# Patient Record
Sex: Female | Born: 1940 | Race: Black or African American | Hispanic: No | Marital: Married | Smoking: Former smoker
Health system: Southern US, Community
[De-identification: ages and names within clinical notes are randomized; demographics above are authoritative.]

## PROBLEM LIST (undated history)

## (undated) DIAGNOSIS — Z5189 Encounter for other specified aftercare: Secondary | ICD-10-CM

## (undated) DIAGNOSIS — D508 Other iron deficiency anemias: Secondary | ICD-10-CM

## (undated) DIAGNOSIS — K760 Fatty (change of) liver, not elsewhere classified: Secondary | ICD-10-CM

## (undated) DIAGNOSIS — I10 Essential (primary) hypertension: Secondary | ICD-10-CM

## (undated) DIAGNOSIS — D5720 Sickle-cell/Hb-C disease without crisis: Secondary | ICD-10-CM

## (undated) DIAGNOSIS — D631 Anemia in chronic kidney disease: Secondary | ICD-10-CM

## (undated) DIAGNOSIS — N189 Chronic kidney disease, unspecified: Principal | ICD-10-CM

## (undated) DIAGNOSIS — D571 Sickle-cell disease without crisis: Secondary | ICD-10-CM

## (undated) HISTORY — DX: Fatty (change of) liver, not elsewhere classified: K76.0

## (undated) HISTORY — DX: Anemia in chronic kidney disease: D63.1

## (undated) HISTORY — PX: SHOULDER SURGERY: SHX246

## (undated) HISTORY — PX: KNEE SURGERY: SHX244

## (undated) HISTORY — PX: CHOLECYSTECTOMY: SHX55

## (undated) HISTORY — PX: APPENDECTOMY: SHX54

## (undated) HISTORY — DX: Sickle-cell/Hb-C disease without crisis: D57.20

## (undated) HISTORY — DX: Chronic kidney disease, unspecified: N18.9

## (undated) HISTORY — DX: Other iron deficiency anemias: D50.8

---

## 1998-01-21 ENCOUNTER — Other Ambulatory Visit: Admission: RE | Admit: 1998-01-21 | Discharge: 1998-01-21 | Payer: Self-pay | Admitting: Obstetrics & Gynecology

## 1999-07-07 ENCOUNTER — Encounter: Admission: RE | Admit: 1999-07-07 | Discharge: 1999-10-05 | Payer: Self-pay | Admitting: Internal Medicine

## 1999-09-27 ENCOUNTER — Encounter: Admission: RE | Admit: 1999-09-27 | Discharge: 1999-09-27 | Payer: Self-pay | Admitting: Internal Medicine

## 1999-09-27 ENCOUNTER — Encounter: Payer: Self-pay | Admitting: Internal Medicine

## 1999-10-12 ENCOUNTER — Encounter (INDEPENDENT_AMBULATORY_CARE_PROVIDER_SITE_OTHER): Payer: Self-pay

## 1999-10-12 ENCOUNTER — Other Ambulatory Visit: Admission: RE | Admit: 1999-10-12 | Discharge: 1999-10-12 | Payer: Self-pay | Admitting: Obstetrics & Gynecology

## 2000-11-27 ENCOUNTER — Inpatient Hospital Stay (HOSPITAL_COMMUNITY): Admission: AD | Admit: 2000-11-27 | Discharge: 2000-11-29 | Payer: Self-pay | Admitting: Internal Medicine

## 2001-01-02 ENCOUNTER — Observation Stay (HOSPITAL_COMMUNITY): Admission: EM | Admit: 2001-01-02 | Discharge: 2001-01-06 | Payer: Self-pay | Admitting: Emergency Medicine

## 2001-01-02 ENCOUNTER — Encounter: Payer: Self-pay | Admitting: Emergency Medicine

## 2001-01-03 ENCOUNTER — Encounter: Payer: Self-pay | Admitting: Internal Medicine

## 2001-01-07 ENCOUNTER — Encounter: Admission: RE | Admit: 2001-01-07 | Discharge: 2001-04-07 | Payer: Self-pay | Admitting: Internal Medicine

## 2001-09-23 ENCOUNTER — Other Ambulatory Visit: Admission: RE | Admit: 2001-09-23 | Discharge: 2001-09-23 | Payer: Self-pay | Admitting: Obstetrics & Gynecology

## 2002-03-28 ENCOUNTER — Other Ambulatory Visit: Admission: RE | Admit: 2002-03-28 | Discharge: 2002-03-28 | Payer: Self-pay | Admitting: Obstetrics & Gynecology

## 2003-04-30 ENCOUNTER — Other Ambulatory Visit: Admission: RE | Admit: 2003-04-30 | Discharge: 2003-04-30 | Payer: Self-pay | Admitting: Obstetrics & Gynecology

## 2004-05-09 ENCOUNTER — Other Ambulatory Visit: Admission: RE | Admit: 2004-05-09 | Discharge: 2004-05-09 | Payer: Self-pay | Admitting: Obstetrics & Gynecology

## 2005-06-14 ENCOUNTER — Other Ambulatory Visit: Admission: RE | Admit: 2005-06-14 | Discharge: 2005-06-14 | Payer: Self-pay | Admitting: Obstetrics & Gynecology

## 2005-08-15 ENCOUNTER — Ambulatory Visit: Payer: Self-pay | Admitting: Hematology and Oncology

## 2005-08-18 ENCOUNTER — Encounter (HOSPITAL_COMMUNITY): Admission: RE | Admit: 2005-08-18 | Discharge: 2005-09-15 | Payer: Self-pay | Admitting: Hematology and Oncology

## 2005-11-15 ENCOUNTER — Ambulatory Visit: Payer: Self-pay | Admitting: Hematology and Oncology

## 2006-03-06 ENCOUNTER — Ambulatory Visit: Payer: Self-pay | Admitting: Hematology and Oncology

## 2006-03-08 LAB — CBC WITH DIFFERENTIAL/PLATELET
BASO%: 0.9 % (ref 0.0–2.0)
EOS%: 2 % (ref 0.0–7.0)
HCT: 27.4 % — ABNORMAL LOW (ref 34.8–46.6)
LYMPH%: 24.8 % (ref 14.0–48.0)
MCH: 24.1 pg — ABNORMAL LOW (ref 26.0–34.0)
MCHC: 33.6 g/dL (ref 32.0–36.0)
MCV: 71.7 fL — ABNORMAL LOW (ref 81.0–101.0)
MONO%: 8 % (ref 0.0–13.0)
NEUT%: 64.3 % (ref 39.6–76.8)
Platelets: 140 10*3/uL — ABNORMAL LOW (ref 145–400)
RBC: 3.82 10*6/uL (ref 3.70–5.32)

## 2006-03-08 LAB — COMPREHENSIVE METABOLIC PANEL
ALT: 11 U/L (ref 0–40)
AST: 16 U/L (ref 0–37)
Alkaline Phosphatase: 75 U/L (ref 39–117)
CO2: 19 mEq/L (ref 19–32)
Creatinine, Ser: 1.69 mg/dL — ABNORMAL HIGH (ref 0.40–1.20)
Total Bilirubin: 0.8 mg/dL (ref 0.3–1.2)

## 2006-03-08 LAB — VITAMIN B12: Vitamin B-12: 617 pg/mL (ref 211–911)

## 2006-09-04 ENCOUNTER — Ambulatory Visit: Payer: Self-pay | Admitting: Hematology and Oncology

## 2008-02-19 ENCOUNTER — Encounter: Admission: RE | Admit: 2008-02-19 | Discharge: 2008-05-21 | Payer: Self-pay | Admitting: Orthopaedic Surgery

## 2008-06-01 ENCOUNTER — Encounter: Admission: RE | Admit: 2008-06-01 | Discharge: 2008-06-01 | Payer: Self-pay | Admitting: Orthopaedic Surgery

## 2009-11-30 ENCOUNTER — Ambulatory Visit (HOSPITAL_BASED_OUTPATIENT_CLINIC_OR_DEPARTMENT_OTHER)
Admission: RE | Admit: 2009-11-30 | Discharge: 2009-11-30 | Payer: Self-pay | Source: Home / Self Care | Admitting: Internal Medicine

## 2009-11-30 ENCOUNTER — Ambulatory Visit: Payer: Self-pay | Admitting: Diagnostic Radiology

## 2010-01-27 ENCOUNTER — Ambulatory Visit: Payer: Self-pay | Admitting: Hematology & Oncology

## 2010-01-28 LAB — CBC WITH DIFFERENTIAL (CANCER CENTER ONLY)
BASO#: 0 10*3/uL (ref 0.0–0.2)
EOS%: 4.5 % (ref 0.0–7.0)
Eosinophils Absolute: 0.3 10*3/uL (ref 0.0–0.5)
HGB: 9.2 g/dL — ABNORMAL LOW (ref 11.6–15.9)
LYMPH#: 2.2 10*3/uL (ref 0.9–3.3)
MCHC: 32 g/dL (ref 32.0–36.0)
MONO#: 0.5 10*3/uL (ref 0.1–0.9)
NEUT#: 3.9 10*3/uL (ref 1.5–6.5)
RBC: 3.82 10*6/uL (ref 3.70–5.32)
WBC: 6.9 10*3/uL (ref 3.9–10.0)

## 2010-01-28 LAB — TECHNOLOGIST REVIEW CHCC SATELLITE

## 2010-02-04 ENCOUNTER — Ambulatory Visit (HOSPITAL_BASED_OUTPATIENT_CLINIC_OR_DEPARTMENT_OTHER): Admission: RE | Admit: 2010-02-04 | Discharge: 2010-02-04 | Payer: Self-pay | Admitting: Hematology & Oncology

## 2010-02-04 ENCOUNTER — Ambulatory Visit: Payer: Self-pay | Admitting: Diagnostic Radiology

## 2010-02-07 LAB — HGB ELECTROPHORESIS REFLEXED REPORT
Hemoglobin A - HGBRFX: 0 % — ABNORMAL LOW (ref >96.0)
Hemoglobin Elect C: 44.2 % — ABNORMAL HIGH
Sickle Solubility Test - HGBRFX: POSITIVE — AB

## 2010-02-07 LAB — LACTATE DEHYDROGENASE: LDH: 205 U/L (ref 94–250)

## 2010-02-07 LAB — COMPREHENSIVE METABOLIC PANEL
AST: 10 U/L (ref 0–37)
Albumin: 4.6 g/dL (ref 3.5–5.2)
Alkaline Phosphatase: 97 U/L (ref 39–117)
Glucose, Bld: 74 mg/dL (ref 70–99)
Potassium: 4.5 mEq/L (ref 3.5–5.3)
Sodium: 142 mEq/L (ref 135–145)
Total Protein: 6.9 g/dL (ref 6.0–8.3)

## 2010-02-07 LAB — HEMOGLOBINOPATHY EVALUATION: Hgb S Quant: 49.8 % — ABNORMAL HIGH (ref 0.0–0.0)

## 2010-02-07 LAB — RETICULOCYTES (CHCC)
RBC.: 3.83 MIL/uL — ABNORMAL LOW (ref 3.87–5.11)
Retic Ct Pct: 5 % — ABNORMAL HIGH (ref 0.4–3.1)

## 2010-02-24 LAB — FERRITIN: Ferritin: 473 ng/mL — ABNORMAL HIGH (ref 10–291)

## 2010-02-24 LAB — CBC WITH DIFFERENTIAL (CANCER CENTER ONLY)
BASO#: 0.1 10*3/uL (ref 0.0–0.2)
EOS%: 4.2 % (ref 0.0–7.0)
Eosinophils Absolute: 0.3 10*3/uL (ref 0.0–0.5)
HGB: 9.4 g/dL — ABNORMAL LOW (ref 11.6–15.9)
LYMPH#: 2.2 10*3/uL (ref 0.9–3.3)
MONO#: 0.4 10*3/uL (ref 0.1–0.9)
NEUT#: 3.6 10*3/uL (ref 1.5–6.5)
RBC: 3.92 10*6/uL (ref 3.70–5.32)
WBC: 6.6 10*3/uL (ref 3.9–10.0)

## 2010-02-24 LAB — RETICULOCYTES (CHCC)
RBC.: 3.87 MIL/uL (ref 3.87–5.11)
Retic Ct Pct: 3.9 % — ABNORMAL HIGH (ref 0.4–3.1)

## 2010-02-24 LAB — TECHNOLOGIST REVIEW CHCC SATELLITE

## 2010-03-25 ENCOUNTER — Ambulatory Visit: Payer: Self-pay | Admitting: Hematology & Oncology

## 2010-03-28 LAB — CBC WITH DIFFERENTIAL (CANCER CENTER ONLY)
BASO%: 0.5 % (ref 0.0–2.0)
LYMPH%: 35.3 % (ref 14.0–48.0)
MCH: 23.8 pg — ABNORMAL LOW (ref 26.0–34.0)
MCV: 74 fL — ABNORMAL LOW (ref 81–101)
MONO%: 7.8 % (ref 0.0–13.0)
NEUT#: 3.4 10*3/uL (ref 1.5–6.5)
Platelets: 103 10*3/uL — ABNORMAL LOW (ref 145–400)
RDW: 19.2 % — ABNORMAL HIGH (ref 10.5–14.6)
WBC: 6.4 10*3/uL (ref 3.9–10.0)

## 2010-03-28 LAB — BASIC METABOLIC PANEL
Calcium: 8.8 mg/dL (ref 8.4–10.5)
Creatinine, Ser: 1.45 mg/dL — ABNORMAL HIGH (ref 0.40–1.20)
Sodium: 137 mEq/L (ref 135–145)

## 2010-03-28 LAB — TECHNOLOGIST REVIEW CHCC SATELLITE

## 2010-05-27 ENCOUNTER — Ambulatory Visit: Payer: Self-pay | Admitting: Hematology & Oncology

## 2010-06-13 LAB — RETICULOCYTES (CHCC)
ABS Retic: 129.2 10*3/uL (ref 19.0–186.0)
RBC.: 3.23 MIL/uL — ABNORMAL LOW (ref 3.87–5.11)
Retic Ct Pct: 4 % — ABNORMAL HIGH (ref 0.4–3.1)

## 2010-06-13 LAB — CBC WITH DIFFERENTIAL (CANCER CENTER ONLY)
BASO#: 0 10*3/uL (ref 0.0–0.2)
BASO%: 0.4 % (ref 0.0–2.0)
EOS%: 4.7 % (ref 0.0–7.0)
Eosinophils Absolute: 0.3 10*3/uL (ref 0.0–0.5)
HCT: 22.4 % — ABNORMAL LOW (ref 34.8–46.6)
HGB: 7.3 g/dL — ABNORMAL LOW (ref 11.6–15.9)
LYMPH#: 1.8 10*3/uL (ref 0.9–3.3)
LYMPH%: 28.5 % (ref 14.0–48.0)
MCH: 23.6 pg — ABNORMAL LOW (ref 26.0–34.0)
MCHC: 32.6 g/dL (ref 32.0–36.0)
MCV: 72 fL — ABNORMAL LOW (ref 81–101)
MONO#: 0.4 10*3/uL (ref 0.1–0.9)
MONO%: 6.2 % (ref 0.0–13.0)
NEUT#: 3.8 10*3/uL (ref 1.5–6.5)
NEUT%: 60.2 % (ref 39.6–80.0)
Platelets: 86 10*3/uL — ABNORMAL LOW (ref 145–400)
RBC: 3.1 10*6/uL — ABNORMAL LOW (ref 3.70–5.32)
RDW: 18.4 % — ABNORMAL HIGH (ref 10.5–14.6)
WBC: 6.4 10*3/uL (ref 3.9–10.0)

## 2010-06-13 LAB — TECHNOLOGIST REVIEW CHCC SATELLITE

## 2010-06-27 ENCOUNTER — Ambulatory Visit: Payer: Self-pay | Admitting: Hematology & Oncology

## 2010-06-27 LAB — CBC WITH DIFFERENTIAL (CANCER CENTER ONLY)
BASO#: 0 10*3/uL (ref 0.0–0.2)
BASO%: 0.3 % (ref 0.0–2.0)
EOS%: 6 % (ref 0.0–7.0)
Eosinophils Absolute: 0.3 10*3/uL (ref 0.0–0.5)
HCT: 29 % — ABNORMAL LOW (ref 34.8–46.6)
HGB: 9.2 g/dL — ABNORMAL LOW (ref 11.6–15.9)
LYMPH#: 1.5 10*3/uL (ref 0.9–3.3)
LYMPH%: 30.1 % (ref 14.0–48.0)
MCH: 23.8 pg — ABNORMAL LOW (ref 26.0–34.0)
MCHC: 31.7 g/dL — ABNORMAL LOW (ref 32.0–36.0)
MCV: 75 fL — ABNORMAL LOW (ref 81–101)
MONO#: 0.3 10*3/uL (ref 0.1–0.9)
MONO%: 6 % (ref 0.0–13.0)
NEUT#: 2.9 10*3/uL (ref 1.5–6.5)
NEUT%: 57.6 % (ref 39.6–80.0)
Platelets: 101 10*3/uL — ABNORMAL LOW (ref 145–400)
RBC: 3.88 10*6/uL (ref 3.70–5.32)
RDW: 20.8 % — ABNORMAL HIGH (ref 10.5–14.6)
WBC: 5 10*3/uL (ref 3.9–10.0)

## 2010-06-27 LAB — TECHNOLOGIST REVIEW CHCC SATELLITE

## 2010-07-27 ENCOUNTER — Ambulatory Visit: Payer: Self-pay | Admitting: Hematology & Oncology

## 2010-07-28 LAB — CBC WITH DIFFERENTIAL (CANCER CENTER ONLY)
BASO%: 1 % (ref 0.0–2.0)
LYMPH#: 1.8 10*3/uL (ref 0.9–3.3)
MONO#: 0.4 10*3/uL (ref 0.1–0.9)
Platelets: 105 10*3/uL — ABNORMAL LOW (ref 145–400)
RDW: 20.3 % — ABNORMAL HIGH (ref 10.5–14.6)
WBC: 5.9 10*3/uL (ref 3.9–10.0)

## 2010-07-28 LAB — CHCC SATELLITE - SMEAR

## 2010-08-01 LAB — HEMOGLOBINOPATHY EVALUATION
Hemoglobin Other: 43.2 % — ABNORMAL HIGH (ref 0.0–0.0)
Hgb A2 Quant: 3.7 % — ABNORMAL HIGH (ref 2.2–3.2)
Hgb A: 0 % — ABNORMAL LOW (ref 96.8–97.8)
Hgb F Quant: 3.1 % — ABNORMAL HIGH (ref 0.0–2.0)
Hgb S Quant: 50 % — ABNORMAL HIGH (ref 0.0–0.0)

## 2010-08-01 LAB — IRON AND TIBC
Iron: 70 ug/dL (ref 42–145)
UIBC: 263 ug/dL

## 2010-08-01 LAB — FERRITIN: Ferritin: 278 ng/mL (ref 10–291)

## 2010-09-30 ENCOUNTER — Ambulatory Visit: Payer: Self-pay | Admitting: Hematology & Oncology

## 2011-02-03 NOTE — Discharge Summary (Signed)
[place]. [facility]  Patient:    Rachel Palmer, Rachel Palmer                        MRN: [id number] Adm. Date:  [id number] Disc. Date: [id number] Attending:  Alva Garnet.                           Discharge Summary  DISCHARGE DIAGNOSES: 1. Weakness, shortness of breath with abnormal electrocardiogram. 2. New onset type 2 diabetes mellitus. 3. Hypertension. 4. Generalized anxiety/depression. 5. Sickle cell disease.  HOSPITAL COURSE:  Ms. [name] was admitted for evaluation of tachycardia associated with weakness and shortness of breath.  She also had an abnormal EKG which revealed ST depressions in leads 1, 2, 3, and aVF as well as V3 through V6.  Telemetry showed sinus tachycardia, however, no other ischemic changes or arrhythmias.  Her troponin and creatinine kinase enzymes were also unremarkable.  An echocardiogram per Dr. Allyson Sabal which was obtained on this admission showed that her left ventricular size was normal and her overall left ventricular systolic function was normal with ejection fraction between 55 and 65%.  A Cardiolite stress test will be obtained as an outpatient with further cardiology evaluation.  It was also noted during this admission that Ms. [name] had new onset type 2 diabetes mellitus.  Her admission glucose was in the 300s and upon discharge her blood sugar was 203.  She was started on Glucophage during this admission which she tolerated well.  She was also given diabetic education which she will continue as an outpatient.  She will also require an ophthalmology evaluation which will be obtained as an outpatient as well.  Ms. [name] blood pressure was well controlled during this admission.  She did not tolerate Altace which was prescribed for her, because she states that it caused increased weakness and lethargy.  She does tolerate Hyzar and Lopressor well which she will continue.  Ms. [name] suffers from generalized  anxiety/depression which she states is fairly well controlled with Wellbutrin which she will continue at this time.  DISCHARGE MEDICATIONS: 1. Glucophage 1000 mg p.o. b.i.d. 2. Lopressor 50 mg p.o. b.i.d. 3. Hyzaar 100/25 one p.o. q.d. 4. Wellbutrin 150 mg p.o. q.d. 5. Folic acid 1 mg p.o. q.d.  FOLLOW-UP:  Ms. [name] will be seen by Merlene Laughter. Renae Gloss, M.D. on Thursday, April 25, at 10 a.m.  She will also be followed by cardiology within the next two weeks following discharge.  She has nutrition and diabetes management class scheduled for Monday, April 22, at 5:15 p.m.  DISPOSITION:  Ms. [name] was chest pain free.  She denied shortness of breath or chest pain.  She denied weakness, she denied pain in any other area at the time of discharge.  She was ambulating and tolerating oral intake without complications. DD:  01/06/01 TD:  01/07/01 Job: [id number]

## 2011-02-03 NOTE — Discharge Summary (Signed)
[place]. [facility]  Patient:    Rachel Palmer, Rachel Palmer                        MRN: [id number] Adm. Date:  [id number] Disc. Date: [id number] Attending:  Alva Garnet.                           Discharge Summary  DISCHARGE DIAGNOSES: 1. Hemoglobin AC pain crisis. 2. Constipation.  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  She has rare crises.  She had pain extensively in her shoulders, right arm, and lower extremities.  She was placed on morphine IV and Toradol during the course of this admission with marked improvement of her pain within 24-36 hours of admission.  She did develop constipation for which she was given Fleets enema, and she will be treated for this as an outpatient with magnesium citrate.  DISPOSITION:  Upon discharge, Rachel Palmer had significantly improved pain.  DISCHARGE MEDICATIONS: 1. MSIR 30 mg 1-2 p.o. q.4h. p.r.n. for pain. 2. Ibuprofen 800 mg 1 p.o. t.i.d. p.r.n. for pain. 3. Hyzaar 100/25 1 p.o. q.d. 4. Wellbutrin 150 mg p.o. q.d. 5. ______ Primphase 0.625/5 p.o. q.d.  DISPOSITION:  Upon discharge, Rachel Palmer was eating and ambulating without complications.  On follow-up, Rachel Palmer will be seen by Dr. Andi Devon on Monday, March 18, at 11 a.m. for follow-up. DD:  11/29/00 TD:  11/29/00 Job: [id number]

## 2011-02-03 NOTE — H&P (Signed)
[place]. [facility]  Patient:    Rachel Palmer, Rachel Palmer                        MRN: [id number] Adm. Date:  [id number] Disc. Date: [id number] Attending:  Alva Garnet.                         History and Physical  CHIEF COMPLAINT:  Crisis pain.  HISTORY OF PRESENT ILLNESS:  Rachel Palmer is a 70 year old lady who has a progressive three or four day history of severe shoulder and leg pain.  She states that the patient is similar to her previous episodes of crisis pain. 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  She states outpatient medical regimen of analgesics including narcotics have not been effective in relieving her pain.  She has no other acute constitutional systemic complaints including chest pain, shortness of breath, GI/GU symptoms, or neurological symptoms.  ALLERGIES:  CODEINE and IODINE, which causes hives.  PAST MEDICAL HISTORY:  Hypertension, anxiety/depression, hemoglobin A-C disease.  MEDICATIONS: 1. Hyzaar 100/25 one p.o. q.d. 2. Premphase 0.625/2.5 one p.o. q.d. 3. Wellbutrin SR 150 mg p.o. q.d.  REVIEW OF SYSTEMS:  As per HPI and health history assessment.  SOCIAL HISTORY:  Rachel Palmer denies tobacco, alcohol, or drugs of abuse.  PHYSICAL EXAMINATION:  GENERAL APPEARANCE:  Well-developed, well-nourished black female in no acute distress.  VITAL SIGNS:  Blood pressure 112/62, pulse 104, temperature 97.4.  HEENT:  TMs within normal limits bilaterally.  Normal ______.  PERRLA.  NECK:  Supple, no masses, 2+ carotids, no bruits.  LUNGS:  Clear to auscultation bilaterally.  HEART:  S1, S2.  Regular rate and rhythm.  No murmurs, rubs, or gallops.  ABDOMEN:  Soft, nontender, nondistended.  Positive bowel sounds.  EXTREMITIES:  No clubbing, cyanosis, or edema.  Pulses 2+ throughout.  NEUROLOGIC:  Alert and oriented x 3.  Cranial nerves intact.  ASSESSMENT AND PLAN:  Hemoglobin A-C disease with  crisis pain.  Rachel Palmer will be given IV narcotic medications, Phenergan, and fluids to help arrest her crisis pain. DD:  01/25/01 TD:  01/26/01 Job: [id number]

## 2011-05-29 ENCOUNTER — Other Ambulatory Visit: Payer: Self-pay | Admitting: Hematology & Oncology

## 2011-05-29 ENCOUNTER — Encounter (HOSPITAL_BASED_OUTPATIENT_CLINIC_OR_DEPARTMENT_OTHER): Payer: Medicare Other | Admitting: Hematology & Oncology

## 2011-05-29 DIAGNOSIS — E8779 Other fluid overload: Secondary | ICD-10-CM

## 2011-05-29 DIAGNOSIS — D696 Thrombocytopenia, unspecified: Secondary | ICD-10-CM

## 2011-05-29 DIAGNOSIS — R197 Diarrhea, unspecified: Secondary | ICD-10-CM

## 2011-05-29 DIAGNOSIS — D571 Sickle-cell disease without crisis: Secondary | ICD-10-CM

## 2011-05-29 DIAGNOSIS — E119 Type 2 diabetes mellitus without complications: Secondary | ICD-10-CM

## 2011-05-29 LAB — CBC WITH DIFFERENTIAL (CANCER CENTER ONLY)
BASO%: 0.6 % (ref 0.0–2.0)
EOS%: 1.8 % (ref 0.0–7.0)
HCT: 23 % — ABNORMAL LOW (ref 34.8–46.6)
LYMPH#: 2.3 10*3/uL (ref 0.9–3.3)
MCHC: 35.2 g/dL (ref 32.0–36.0)
MONO%: 6.5 % (ref 0.0–13.0)
NEUT%: 58.3 % (ref 39.6–80.0)
RDW: 20.8 % — ABNORMAL HIGH (ref 11.1–15.7)

## 2011-05-31 LAB — COMPREHENSIVE METABOLIC PANEL
ALT: 10 U/L (ref 0–35)
AST: 16 U/L (ref 0–37)
Alkaline Phosphatase: 99 U/L (ref 39–117)
Chloride: 106 mEq/L (ref 96–112)
Creatinine, Ser: 1.59 mg/dL — ABNORMAL HIGH (ref 0.50–1.10)
Total Bilirubin: 1.8 mg/dL — ABNORMAL HIGH (ref 0.3–1.2)

## 2011-05-31 LAB — FERRITIN: Ferritin: 897 ng/mL — ABNORMAL HIGH (ref 10–291)

## 2011-05-31 LAB — HEMOGLOBINOPATHY EVALUATION
Hemoglobin Other: 43.6 % — ABNORMAL HIGH (ref 0.0–0.0)
Hgb F Quant: 4.5 % — ABNORMAL HIGH (ref 0.0–2.0)
Hgb S Quant: 48.1 % — ABNORMAL HIGH (ref 0.0–0.0)

## 2011-05-31 LAB — VITAMIN D 25 HYDROXY (VIT D DEFICIENCY, FRACTURES): Vit D, 25-Hydroxy: 16 ng/mL — ABNORMAL LOW (ref 30–89)

## 2011-05-31 LAB — RETICULOCYTES (CHCC)
RBC.: 3.39 MIL/uL — ABNORMAL LOW (ref 3.87–5.11)
Retic Ct Pct: 6.7 % — ABNORMAL HIGH (ref 0.4–2.3)

## 2011-06-19 ENCOUNTER — Other Ambulatory Visit: Payer: Self-pay | Admitting: Hematology & Oncology

## 2011-06-19 ENCOUNTER — Encounter: Payer: Medicare Other | Admitting: Hematology & Oncology

## 2011-06-19 LAB — CBC WITH DIFFERENTIAL (CANCER CENTER ONLY)
BASO%: 0.5 % (ref 0.0–2.0)
EOS%: 1.4 % (ref 0.0–7.0)
LYMPH#: 4.6 10*3/uL — ABNORMAL HIGH (ref 0.9–3.3)
MCHC: 34.7 g/dL (ref 32.0–36.0)
MONO#: 1.4 10*3/uL — ABNORMAL HIGH (ref 0.1–0.9)
Platelets: 93 10*3/uL — ABNORMAL LOW (ref 145–400)
RDW: 22.1 % — ABNORMAL HIGH (ref 11.1–15.7)
WBC: 13.6 10*3/uL — ABNORMAL HIGH (ref 3.9–10.0)

## 2011-06-19 LAB — CHCC SATELLITE - SMEAR

## 2011-06-19 LAB — TECHNOLOGIST REVIEW CHCC SATELLITE

## 2011-06-20 ENCOUNTER — Other Ambulatory Visit: Payer: Self-pay

## 2011-06-20 ENCOUNTER — Emergency Department (HOSPITAL_BASED_OUTPATIENT_CLINIC_OR_DEPARTMENT_OTHER)
Admission: EM | Admit: 2011-06-20 | Discharge: 2011-06-20 | Disposition: A | Discharge status: Home / Self Care | Payer: Medicare Other | Source: Home / Self Care | Attending: Emergency Medicine | Admitting: Emergency Medicine

## 2011-06-20 ENCOUNTER — Encounter: Payer: Self-pay | Admitting: *Deleted

## 2011-06-20 ENCOUNTER — Inpatient Hospital Stay (HOSPITAL_COMMUNITY)
Admission: AD | Admit: 2011-06-20 | Discharge: 2011-06-24 | DRG: 682 | Disposition: A | Discharge status: Other Acute Inpatient Hospital | Payer: Medicare Other | Source: Other Acute Inpatient Hospital | Attending: Internal Medicine | Admitting: Internal Medicine

## 2011-06-20 DIAGNOSIS — Z91199 Patient's noncompliance with other medical treatment and regimen due to unspecified reason: Secondary | ICD-10-CM

## 2011-06-20 DIAGNOSIS — N189 Chronic kidney disease, unspecified: Secondary | ICD-10-CM | POA: Diagnosis present

## 2011-06-20 DIAGNOSIS — E119 Type 2 diabetes mellitus without complications: Secondary | ICD-10-CM | POA: Insufficient documentation

## 2011-06-20 DIAGNOSIS — Z794 Long term (current) use of insulin: Secondary | ICD-10-CM

## 2011-06-20 DIAGNOSIS — F3289 Other specified depressive episodes: Secondary | ICD-10-CM | POA: Diagnosis present

## 2011-06-20 DIAGNOSIS — I129 Hypertensive chronic kidney disease with stage 1 through stage 4 chronic kidney disease, or unspecified chronic kidney disease: Secondary | ICD-10-CM | POA: Diagnosis present

## 2011-06-20 DIAGNOSIS — N179 Acute kidney failure, unspecified: Principal | ICD-10-CM | POA: Diagnosis present

## 2011-06-20 DIAGNOSIS — F411 Generalized anxiety disorder: Secondary | ICD-10-CM | POA: Diagnosis present

## 2011-06-20 DIAGNOSIS — D696 Thrombocytopenia, unspecified: Secondary | ICD-10-CM | POA: Diagnosis present

## 2011-06-20 DIAGNOSIS — E875 Hyperkalemia: Secondary | ICD-10-CM | POA: Diagnosis present

## 2011-06-20 DIAGNOSIS — N19 Unspecified kidney failure: Secondary | ICD-10-CM

## 2011-06-20 DIAGNOSIS — D5700 Hb-SS disease with crisis, unspecified: Secondary | ICD-10-CM | POA: Diagnosis present

## 2011-06-20 DIAGNOSIS — Z9119 Patient's noncompliance with other medical treatment and regimen: Secondary | ICD-10-CM

## 2011-06-20 DIAGNOSIS — D571 Sickle-cell disease without crisis: Secondary | ICD-10-CM

## 2011-06-20 DIAGNOSIS — F329 Major depressive disorder, single episode, unspecified: Secondary | ICD-10-CM | POA: Diagnosis present

## 2011-06-20 DIAGNOSIS — D7389 Other diseases of spleen: Secondary | ICD-10-CM | POA: Diagnosis present

## 2011-06-20 DIAGNOSIS — IMO0001 Reserved for inherently not codable concepts without codable children: Secondary | ICD-10-CM | POA: Diagnosis present

## 2011-06-20 HISTORY — DX: Sickle-cell disease without crisis: D57.1

## 2011-06-20 HISTORY — DX: Essential (primary) hypertension: I10

## 2011-06-20 LAB — COMPREHENSIVE METABOLIC PANEL
ALT: 10 U/L (ref 0–35)
AST: 26 U/L (ref 0–37)
Albumin: 4.8 g/dL (ref 3.5–5.2)
Albumin: 4.5 g/dL (ref 3.5–5.2)
Alkaline Phosphatase: 108 U/L (ref 39–117)
BUN: 71 mg/dL — ABNORMAL HIGH (ref 6–23)
CO2: 14 mEq/L — ABNORMAL LOW (ref 19–32)
Calcium: 9.5 mg/dL (ref 8.4–10.5)
Glucose, Bld: 193 mg/dL — ABNORMAL HIGH (ref 70–99)
Glucose, Bld: 216 mg/dL — ABNORMAL HIGH (ref 70–99)
Potassium: 6 mEq/L — ABNORMAL HIGH (ref 3.5–5.3)
Potassium: 6.6 mEq/L (ref 3.5–5.1)
Sodium: 136 mEq/L (ref 135–145)
Sodium: 133 mEq/L — ABNORMAL LOW (ref 135–145)
Total Protein: 7.4 g/dL (ref 6.0–8.3)
Total Protein: 7.8 g/dL (ref 6.0–8.3)

## 2011-06-20 LAB — CBC
HCT: 18.1 % — ABNORMAL LOW (ref 36.0–46.0)
Hemoglobin: 7.2 g/dL — ABNORMAL LOW (ref 12.0–15.0)
Hemoglobin: 6.4 g/dL — CL (ref 12.0–15.0)
MCH: 25.5 pg — ABNORMAL LOW (ref 26.0–34.0)
MCHC: 35 g/dL (ref 30.0–36.0)
MCHC: 35.4 g/dL (ref 30.0–36.0)
MCV: 72.1 fL — ABNORMAL LOW (ref 78.0–100.0)
Platelets: 79 10*3/uL — ABNORMAL LOW (ref 150–400)
RBC: 2.51 MIL/uL — ABNORMAL LOW (ref 3.87–5.11)
RDW: 22 % — ABNORMAL HIGH (ref 11.5–15.5)
RDW: 22.1 % — ABNORMAL HIGH (ref 11.5–15.5)
WBC: 11 10*3/uL — ABNORMAL HIGH (ref 4.0–10.5)

## 2011-06-20 LAB — BASIC METABOLIC PANEL
BUN: 63 mg/dL — ABNORMAL HIGH (ref 6–23)
CO2: 13 mEq/L — ABNORMAL LOW (ref 19–32)
Calcium: 8.7 mg/dL (ref 8.4–10.5)
Chloride: 113 mEq/L — ABNORMAL HIGH (ref 96–112)
Creatinine, Ser: 2.19 mg/dL — ABNORMAL HIGH (ref 0.50–1.10)
GFR calc Af Amer: 25 mL/min — ABNORMAL LOW (ref >90)
GFR calc non Af Amer: 22 mL/min — ABNORMAL LOW (ref >90)
Glucose, Bld: 124 mg/dL — ABNORMAL HIGH (ref 70–99)
Potassium: 5.8 mEq/L — ABNORMAL HIGH (ref 3.5–5.1)
Sodium: 137 mEq/L (ref 135–145)

## 2011-06-20 LAB — URINALYSIS, ROUTINE W REFLEX MICROSCOPIC
Bilirubin Urine: NEGATIVE
Glucose, UA: NEGATIVE mg/dL
Hgb urine dipstick: NEGATIVE
Ketones, ur: NEGATIVE mg/dL
pH: 5.5 (ref 5.0–8.0)

## 2011-06-20 LAB — LACTATE DEHYDROGENASE: LDH: 389 U/L — ABNORMAL HIGH (ref 94–250)

## 2011-06-20 LAB — FERRITIN: Ferritin: 1791 ng/mL — ABNORMAL HIGH (ref 10–291)

## 2011-06-20 MED ORDER — SODIUM POLYSTYRENE SULFONATE 15 GM/60ML PO SUSP
30.0000 g | Freq: Once | ORAL | Status: AC
  Administered 2011-06-20: 30 g via ORAL
  Filled 2011-06-20: qty 120

## 2011-06-20 MED ORDER — DEXTROSE 50 % IV SOLN
50.0000 mL | Freq: Once | INTRAVENOUS | Status: AC
  Administered 2011-06-20: 50 mL via INTRAVENOUS
  Filled 2011-06-20: qty 50

## 2011-06-20 MED ORDER — INSULIN ASPART PROT & ASPART (70-30 MIX) 100 UNIT/ML ~~LOC~~ SUSP
SUBCUTANEOUS | Status: AC
  Administered 2011-06-20: 10 [IU]
  Filled 2011-06-20: qty 3

## 2011-06-20 MED ORDER — SODIUM CHLORIDE 0.9 % IV BOLUS (SEPSIS)
1000.0000 mL | Freq: Once | INTRAVENOUS | Status: AC
  Administered 2011-06-20: 1000 mL via INTRAVENOUS

## 2011-06-20 MED ORDER — INSULIN ASPART 100 UNIT/ML ~~LOC~~ SOLN
10.0000 [IU] | Freq: Once | SUBCUTANEOUS | Status: AC
  Administered 2011-06-20: 10 [IU] via INTRAVENOUS
  Filled 2011-06-20: qty 3

## 2011-06-20 NOTE — ED Notes (Signed)
States she was in Dr Emilee Hero office yesterday for routine check up. Was called by the office this am and told to come here for further testing due to elevated potassium and glucose.

## 2011-06-20 NOTE — ED Notes (Signed)
Pt. Noted with explosive diarrhea.  Cleaned Pt. And gave new gown with clean bed lenins.  No distress noted in pt. And VSS.  SR noted on the ecg monitor.

## 2011-06-20 NOTE — ED Provider Notes (Signed)
History     CSN: [id number] Arrival date & time: 06/20/2011 11:36 AM  Chief Complaint  Patient presents with  . Abnormal Lab    (Consider location/radiation/quality/duration/timing/severity/associated sxs/prior treatment) HPI Patient presents with complaint of an abnormal lab value. She states she was called by Dr. Rolly Salter for this morning and told that her potassium and kidney function tests were abnormal and that she should come to the emergency department. The patient was seen at her doctor's yesterday for a normal checkup. States she's been feeling week but this is going on for several months. Also states she's been having some diarrhea with multiple episodes of liquid stool which has been ongoing for several days. Denies shortness of breath denies chest pain denies fever or cough. Patient states she did receive a blood transfusion during this past month for anemia related to her sickle cell disease.  Past Medical History  Diagnosis Date  . Hypertension   . Sickle cell disease   . Diabetes mellitus     Past Surgical History  Procedure Date  . Cholecystectomy   . Shoulder surgery   . Appendectomy   . Knee surgery     No family history on file.  History  Substance Use Topics  . Smoking status: Former Games developer  . Smokeless tobacco: Not on file  . Alcohol Use: No    OB History    Grav Para Term Preterm Abortions TAB SAB Ect Mult Living                  Review of Systems ROS reviewed and otherwise negative except for mentioned in HPI   Allergies  Codeine; Iohexol; and Ivp dye  Home Medications   Current Outpatient Rx  Name Route Sig Dispense Refill  . ADVAIR HFA IN Inhalation Inhale into the lungs.      . FOLIC ACID PO Oral Take by mouth.      Marland Kitchen GLIPIZIDE PO Oral Take by mouth.      . TOPROL XL PO Oral Take by mouth.      . CREON PO Oral Take by mouth.      Marland Kitchen RAMIPRIL PO Oral Take by mouth.      . SERTRALINE HCL PO Oral Take by mouth.        BP 115/57  Pulse  77  Temp(Src) 97.6 F (36.4 C) (Oral)  Resp 22  SpO2 100% Vitals reviewed Physical Exam Physical Examination: General appearance - alert, well appearing, and in no distress Mental status - alert, oriented to person, place, and time Eyes - pupils equal and reactive, extraocular eye movements intact, scleral icterus noted Mouth - mucous membranes moist, pharynx normal without lesions Chest - clear to auscultation, no wheezes, rales or rhonchi, symmetric air entry Heart - normal rate, regular rhythm, normal S1, S2, no murmurs, rubs, clicks or gallops Abdomen - soft, nontender, nondistended, no masses or organomegaly Neurological - alert, oriented, normal speech, no focal findings or movement disorder noted Extremities - peripheral pulses normal, no pedal edema, no clubbing or cyanosis Skin - normal coloration and turgor, no rashes, no suspicious skin lesions noted  ED Course  Procedures (including critical care time)  Date: 06/20/2011  Rate: 72  Rhythm: normal sinus rhythm  QRS Axis: normal  Intervals: normal  ST/T Wave abnormalities: normal  Conduction Disutrbances:none  Narrative Interpretation:   Old EKG Reviewed: none available    Labs Reviewed - No data to display No results found. Results for orders placed during the hospital  encounter of 06/20/11  CBC      Component Value Range   WBC 13.4 (*) 4.0 - 10.5 (K/uL)   RBC 2.85 (*) 3.87 - 5.11 (MIL/uL)   Hemoglobin 7.2 (*) 12.0 - 15.0 (g/dL)   HCT 16.1 (*) 09.6 - 46.0 (%)   MCV 72.3 (*) 78.0 - 100.0 (fL)   MCH 25.3 (*) 26.0 - 34.0 (pg)   MCHC 35.0  30.0 - 36.0 (g/dL)   RDW 04.5 (*) 40.9 - 15.5 (%)   Platelets 106 (*) 150 - 400 (K/uL)  COMPREHENSIVE METABOLIC PANEL      Component Value Range   Sodium 133 (*) 135 - 145 (mEq/L)   Potassium 6.6 (*) 3.5 - 5.1 (mEq/L)   Chloride 109  96 - 112 (mEq/L)   CO2 12 (*) 19 - 32 (mEq/L)   Glucose, Bld 216 (*) 70 - 99 (mg/dL)   BUN 73 (*) 6 - 23 (mg/dL)   Creatinine, Ser 8.11 (*)  0.50 - 1.10 (mg/dL)   Calcium 9.5  8.4 - 91.4 (mg/dL)   Total Protein 7.8  6.0 - 8.3 (g/dL)   Albumin 4.5  3.5 - 5.2 (g/dL)   AST 26  0 - 37 (U/L)   ALT 10  0 - 35 (U/L)   Alkaline Phosphatase 108  39 - 117 (U/L)   Total Bilirubin 2.0 (*) 0.3 - 1.2 (mg/dL)   GFR calc non Af Amer 19 (*) >90 (mL/min)   GFR calc Af Amer 22 (*) >90 (mL/min)  URINALYSIS, ROUTINE W REFLEX MICROSCOPIC      Component Value Range   Color, Urine YELLOW  YELLOW    Appearance CLEAR  CLEAR    Specific Gravity, Urine 1.014  1.005 - 1.030    pH 5.5  5.0 - 8.0    Glucose, UA NEGATIVE  NEGATIVE (mg/dL)   Hgb urine dipstick NEGATIVE  NEGATIVE    Bilirubin Urine NEGATIVE  NEGATIVE    Ketones, ur NEGATIVE  NEGATIVE (mg/dL)   Protein, ur NEGATIVE  NEGATIVE (mg/dL)   Urobilinogen, UA 0.2  0.0 - 1.0 (mg/dL)   Nitrite NEGATIVE  NEGATIVE    Leukocytes, UA NEGATIVE  NEGATIVE    No results found.    No diagnosis found.    MDM  Pt with elevated potassium, worsening renal insuficiency- baseline approx 1.5.  Pt given insulin/glucose, EKG normal - no peaked t waves, given IV hydration.  Pt updated about findings. Plan for admission - pt requests to stay in the [facility] system.   2:24 PM D/w [facility] for admission to [facility] team 7- will arrange for carelink transfer and repeat electrolytes.       Ethelda Chick, MD 06/20/11 1425

## 2011-06-20 NOTE — ED Notes (Signed)
Pt. IV is intact with NS at 131ml/hr

## 2011-06-20 NOTE — ED Notes (Signed)
Attempted to call report several times RN to get report for 5508 is off the floor.  Secretary to the floor to have RN call MD RN at [facility] when she returns to floor.  RN Earlene Plater informed Environmental consultant that I can't call and hold due to other Pt. Care needed.  Secretary to have RN call.

## 2011-06-21 LAB — RETICULOCYTES (CHCC)

## 2011-06-21 LAB — CBC
HCT: 14.3 % — ABNORMAL LOW (ref 36.0–46.0)
Hemoglobin: 4.9 g/dL — CL (ref 12.0–15.0)
MCH: 24.5 pg — ABNORMAL LOW (ref 26.0–34.0)
MCHC: 34.3 g/dL (ref 30.0–36.0)
MCV: 71.5 fL — ABNORMAL LOW (ref 78.0–100.0)
Platelets: 66 10*3/uL — ABNORMAL LOW (ref 150–400)
RBC: 2 MIL/uL — ABNORMAL LOW (ref 3.87–5.11)
RDW: 22.2 % — ABNORMAL HIGH (ref 11.5–15.5)
WBC: 9.8 10*3/uL (ref 4.0–10.5)

## 2011-06-21 LAB — BASIC METABOLIC PANEL
BUN: 56 mg/dL — ABNORMAL HIGH (ref 6–23)
CO2: 14 mEq/L — ABNORMAL LOW (ref 19–32)
Calcium: 8.5 mg/dL (ref 8.4–10.5)
Chloride: 114 mEq/L — ABNORMAL HIGH (ref 96–112)
Creatinine, Ser: 2.13 mg/dL — ABNORMAL HIGH (ref 0.50–1.10)
GFR calc Af Amer: 26 mL/min — ABNORMAL LOW (ref >90)
GFR calc non Af Amer: 23 mL/min — ABNORMAL LOW (ref >90)
Glucose, Bld: 152 mg/dL — ABNORMAL HIGH (ref 70–99)
Potassium: 5 mEq/L (ref 3.5–5.1)
Sodium: 139 mEq/L (ref 135–145)

## 2011-06-21 LAB — GLUCOSE, CAPILLARY
Glucose-Capillary: 141 mg/dL — ABNORMAL HIGH (ref 70–99)
Glucose-Capillary: 184 mg/dL — ABNORMAL HIGH (ref 70–99)
Glucose-Capillary: 189 mg/dL — ABNORMAL HIGH (ref 70–99)

## 2011-06-22 ENCOUNTER — Inpatient Hospital Stay (HOSPITAL_COMMUNITY): Payer: Medicare Other

## 2011-06-22 DIAGNOSIS — D571 Sickle-cell disease without crisis: Secondary | ICD-10-CM

## 2011-06-22 LAB — COMPREHENSIVE METABOLIC PANEL
ALT: 10 U/L (ref 0–35)
Alkaline Phosphatase: 81 U/L (ref 39–117)
CO2: 15 mEq/L — ABNORMAL LOW (ref 19–32)
Chloride: 117 mEq/L — ABNORMAL HIGH (ref 96–112)
GFR calc Af Amer: 35 mL/min — ABNORMAL LOW (ref >90)
GFR calc non Af Amer: 30 mL/min — ABNORMAL LOW (ref >90)
Glucose, Bld: 224 mg/dL — ABNORMAL HIGH (ref 70–99)
Potassium: 5.1 mEq/L (ref 3.5–5.1)
Sodium: 139 mEq/L (ref 135–145)
Total Bilirubin: 3.6 mg/dL — ABNORMAL HIGH (ref 0.3–1.2)

## 2011-06-22 LAB — CBC
Hemoglobin: 5.6 g/dL — CL (ref 12.0–15.0)
MCH: 25 pg — ABNORMAL LOW (ref 26.0–34.0)
MCV: 73.2 fL — ABNORMAL LOW (ref 78.0–100.0)
Platelets: 60 10*3/uL — ABNORMAL LOW (ref 150–400)
RBC: 2.24 MIL/uL — ABNORMAL LOW (ref 3.87–5.11)

## 2011-06-22 LAB — GLUCOSE, CAPILLARY

## 2011-06-23 ENCOUNTER — Inpatient Hospital Stay (HOSPITAL_COMMUNITY): Payer: Medicare Other

## 2011-06-23 LAB — CBC
Hemoglobin: 4.8 g/dL — CL (ref 12.0–15.0)
Platelets: 50 10*3/uL — ABNORMAL LOW (ref 150–400)
RBC: 1.94 MIL/uL — ABNORMAL LOW (ref 3.87–5.11)

## 2011-06-23 LAB — BASIC METABOLIC PANEL
BUN: 33 mg/dL — ABNORMAL HIGH (ref 6–23)
Calcium: 7.9 mg/dL — ABNORMAL LOW (ref 8.4–10.5)
GFR calc Af Amer: 36 mL/min — ABNORMAL LOW (ref >90)
GFR calc non Af Amer: 31 mL/min — ABNORMAL LOW (ref >90)
Potassium: 4.3 mEq/L (ref 3.5–5.1)
Sodium: 139 mEq/L (ref 135–145)

## 2011-06-23 LAB — PREPARE RBC (CROSSMATCH)

## 2011-06-23 LAB — RETICULOCYTES
RBC.: 1.94 MIL/uL — ABNORMAL LOW (ref 3.87–5.11)
Retic Ct Pct: 9.7 % — ABNORMAL HIGH (ref 0.4–3.1)

## 2011-06-23 LAB — GLUCOSE, CAPILLARY: Glucose-Capillary: 251 mg/dL — ABNORMAL HIGH (ref 70–99)

## 2011-06-23 LAB — SODIUM, URINE, RANDOM: Sodium, Ur: 70 mEq/L

## 2011-06-24 LAB — TYPE AND SCREEN
ABO/RH(D): O POS
Antibody Screen: POSITIVE
DAT, IgG: POSITIVE
Unit division: 0
Unit division: 0
Unit division: 0

## 2011-06-25 LAB — HEMOGLOBIN A1C: Hgb A1c MFr Bld: 5.3 % (ref <5.7)

## 2011-06-25 NOTE — H&P (Signed)
NAMEMarland Kitchen  HASET, [place] NO.:  [id number]  MEDICAL RECORD NO.:  [id number]  LOCATION:  5508                         FACILITY:  [facility]  PHYSICIAN:  Thomasenia Bottoms, MDDATE OF BIRTH:  04-02-1941  DATE OF ADMISSION:  06/20/2011 DATE OF DISCHARGE:                             HISTORY & PHYSICAL   ONCOLOGIST:  Josph Macho, MD.  She has no other primary care physician.  She also sees [facility].  GASTROENTEROLOGIST:  Dr. Dia Crawford in [place]. part of the [facility].  CHIEF COMPLAINT:  The patient sent here for abnormal blood work.  HISTORY OF PRESENTING ILLNESS:  Ms. Berman is a 70 year old woman who has a hemoglobin AC sickle cell disease who was told to come to the emergency department by Dr. Gustavo Lah office.  The patient saw him yesterday and had blood work drawn yesterday, was a followup appointment for her.  She saw him last 3 weeks earlier.  The patient has been out of town in [place]. for most of this year and just got back to town about 3 weeks ago which is when she saw Dr. Myna Hidalgo.  She has been having trouble with severe exertional palpitations and shortness of breath.  No chest pain or edema and then was called when her blood work was abnormal.  PAST MEDICAL HISTORY:  Extensive.  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  She has had multiple blood transfusions in the past, history of bilateral lens implants, history of cholecystectomy, appendectomy, tubal ligation, right shoulder surgery and a right shoulder replacement in 2009, splenomegaly per her CT in 2011.  The patient has had severe trouble for the last 6 months, primarily with nausea,  vomiting and diarrhea.  The diarrhea has been so bad that she frequently will have incontinence of stool.  She has been worked up for this in [facility].  She says she has had at least 2 colonoscopies and 1 EGD within the last 6 months by Dr. Dia Crawford.  She says she has been tested for all kinds of things including celiac disease and so far no diagnosis has been made.  SOCIAL HISTORY:  She does not smoke cigarettes, drink significant alcohol or use any illicit drugs.  FAMILY HISTORY:  Significant for sister who was ill which is why she has been spending sometime in watching her deceased sister who had open heart surgery.  REVIEW OF SYSTEMS:  The patient has had some paresthesias.  She certainly has joint pain from her sickle cell disease.  She also has hip pain likely from the avascular necrosis, but she is not to the point where she requires surgery.  She tells me no difficulty swallowing.  She does have some trouble with depression.  She has had some trouble with shortness of breath and rapid heartbeats particularly with exertion as mentioned above.  No chest pain or lower extremity edema, though significantly she has been  having lots of trouble with diarrhea.  The diarrhea is intermittent, but causes are great deal of discomfort when happens.  It sometimes wakes her up out of sleep in the middle of the night and she has to rush to the bathroom and sometimes she just has loose stools, sometimes it has an explosive watery episode.  This is going on she says for 2-3 years, but has been very severe over the last 6 months.  It has caused her a great amount of distress.  No fevers. She does describe an episode when she was in the [place] where she received a transfusion and got jaundiced and had very, very high fevers and it caused her to have a prolonged hospital stay.  It sounds like she possibly had a transfusion reaction.  All other systems reviewed and are negative  though she does have occasional insomnia  PHYSICAL EXAMINATION:  VITAL SIGNS:  The patient is a direct admission. Her vital signs are still pending at this time. GENERAL:  The patient is well-nourished, well-developed and in no acute distress. HEENT:  Normocephalic, atraumatic.  Her pupils are equal and round.  Her sclerae nonicteric.  Oral mucosa moist. NECK:  Supple.  No lymphadenopathy, no thyromegaly, no jugular venous distention. CARDIAC:  Regular rate and rhythm.  Did not appreciate any murmurs, gallops or rubs. LUNGS:  Clear to auscultation bilaterally.  No wheezes, rhonchi or rales. ABDOMEN:  Soft, nontender and nondistended.  Normoactive bowel sounds. No masses are appreciated.  She has no hepatosplenomegaly, but I did not appreciate a splenomegaly on exam. EXTREMITIES:  No evidence of clubbing, cyanosis or pitting edema.  She has palpable DP pulses bilaterally. SKIN:  Warm, dry and intact.  No open lesions or rashes. MUSCULOSKELETAL:  No evidence of effusion of her joints.  She has known avascular necrosis.  I did not check full range of motion. NEUROLOGICALLY:  She is alert and oriented x3.  She is attentive and appropriate.  She has a normal affect.  Her cranial nerves II through XII are intact grossly.  She moves each of her extremities spontaneously and can go from supine to sitting and back without requiring any assistance.  Her sensory examination is grossly intact to light touch. She has normal muscle tone.  DATA:  All of her labs are still pending at this time from the outside [facility] ER.  Her hemoglobin was 7.2.  White count 13.4, hematocrit 20.6 and platelet count 106.  From the ED, her sodium was 133, potassium 6.6, chloride 109, CO2 12, glucose 216, BUN 73, creatinine 2.5, total bili 2.0, AST 26, ALT 10, total protein 7.8.  Urinalysis was clear and negative for any sign of infection.  Her EKG revealed normal sinus rhythm rate was 72.  No ST-segment  elevation or depression.  It was a normal EKG.  I did review her EKG.  ASSESSMENT AND PLAN: 1. Likely acute on chronic renal failure with hyperkalemia.  The     patient has already received a Q treatment for the hyperkalemia     including Kayexalate.  She arrived here with IV fluids running.  We     will continue the IV fluids, stop her ACE inhibitor and follow her     carefully.  The ED reports that her baseline creatinine is 1.5.  We     will have to obtain the records from Dr. Myna Hidalgo and perhaps from     [place] [facility] if we can. 2. Elevated  WBC count and thrombocytopenia.  We will await her     previous labs as the patient tells me she has had this trouble in     the past and that this was an issue when she was hospitalized. 3. Diabetes mellitus type 2.  We will put her on insulin protocol and     hold her p.o. medications for now. 4. Severe anemia.  We will have to obtain her baseline, but she may     need a transfusion.  We will consult Dr. Myna Hidalgo.  The patient     tells me he has planning or thinking about doing plasmapheresis on     her. 5. Nausea, vomiting and explosive diarrhea.  The patient has been seen     and worked up extensively by GI, would probably not jump into a new     workup without having and understanding what has already been done     for her.  We will continue the pancreatic enzymes that she is on.     She was placed on it because of the diarrhea and not because she     has any pancreas trouble.  She also tells me she is taking Questran     in the past which we may resume. 6. 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 Myna Hidalgo. 7. Hypertension.  We will be holding the patient's antihypertensives     until her blood counts are better and we may need to hold the ACE     inhibitor indefinitely. 8. History of esophagitis.  She tells me which was found on the EGD     that she has had within the last 6 months.  We will make sure she     is on a proton pump inhibitor. 9.  The patient has listed that she has had heparin cross reactors are     an allergy for her.  She says she does not know what this means.     She gets Lovenox.  She tells me every time she is in the hospital     and has never had problems with it.  Her other allergies that are     listed include codeine which causes hives, but she can take     oxycodone and Vicodin and morphine without any difficulty     whatsoever.  IVP dye as listed.  She says that has protected her     kidneys.  Taxol is listed and these heparin cross reactors which     she is not aware of.  A form of IV Keflex as given her hives in the     past, but she can take p.o. Keflex without any difficulty.  The medications listed on her med list include please note, we do not have any doses, folic acid p.o., glipizide p.o., Toprol-XL, pancrelipase, ramipril, sertraline, Zyrtec which is 10 mg, albuterol inhaler and Advair.     Thomasenia Bottoms, MD     CVC/MEDQ  D:  06/20/2011  T:  06/21/2011  Job:  [id number]  cc:   Josph Macho, M.D.  Electronically Signed by Buena Irish MD on 06/25/2011 05:43:48 PM

## 2011-07-03 NOTE — Discharge Summary (Signed)
NAMEMarland Kitchen  Rachel Palmer, [name]                 ACCOUNT NO.:  [id number]  MEDICAL RECORD NO.:  [id number]  LOCATION:  5508                         FACILITY:  [facility]  PHYSICIAN:  Rachel Llano, MD       DATE OF BIRTH:  18-Jan-1941  DATE OF ADMISSION:  06/20/2011 DATE OF DISCHARGE:  06/23/2011                        DISCHARGE SUMMARY - REFERRING   PRIMARY CARE PHYSICIAN:  Rachel Palmer Palmer in [place].  PRIMARY HEMATOLOGIST:  Rachel Palmer Palmer,  [facility].  LOCAL HEMATOLOGIST:  Rachel Macho, MD  REASON FOR ADMISSION:  Sent for abnormal blood work.  DISCHARGE DIAGNOSES: 1. Severe anemia. 2. Hemoglobin AC, sickle cell disease with hemolytic crisis. 3. Chronic kidney disease. 4. Diabetes mellitus type 2, uncontrolled. 5. Thrombocytopenia. 6. Multiple antibodies in her blood making transfusions difficult. 7. Hyperkalemia, resolved. 8. Hypertension. 9. Anxiety/depression. 10.Status post cholecystectomy, appendectomy, tubal ligation.  The     patient has splenomegaly. 11.The patient reported to have at least 2 colonoscopies and one EGD     and one push enteroscopy which were all negative according to her. 12.Radiology, chest x-ray on October 5 showed right PICC line     placement with mid SVC, no pneumothorax. 13.Ultrasound of abdomen on October 4 showed splenomegaly, calculated     splenic volume is 12, 114.5 cubic cm, he is told 1200-14.5 cm both     kidneys demonstrate some cortical thinning right greater than left,     E-coli genicity of the right kidney slightly increased suggesting     chronic medical renal disease.  BRIEF HISTORY AND EXAMINATION:  Rachel Palmer Palmer is a 70 year old African American female with hemoglobin AC, sickle cell disease, diabetes mellitus, and chronic kidney disease.  The patient was having increasing weakness for the past few days.  The patient was seen by her local hematologist, Dr. Myna Palmer.  And she was called after the blood work results were come  to the hospital because of abnormal blood work.  The patient has history of sickle cell disease and she is follow up by [facility].  She is to follow with them and for the past 1 year.  The patient had been out of town in [place] and this came in the about 3 weeks ago to town.  The patient was having exertional palpitations, shortness of breath.  So she went to her hematologist who asked her to come to the hospital for further evaluation.  Upon initial evaluation in the emergency department, the patient was found to have creatinine of 2.5, potassium of 6.6.  The patient also did have a hemoglobin of 7.2 which gradually decreased to 4.9 the night after admission.  Medications at the time of transfer will be; 1. Insulin 3 units subcutaneously 3 times daily with meal. 2. Lantus insulin 10 units daily at bedtime. 3. Advair Diskus 1 puff 500/50 inhaled b.i.d. 4. Albuterol inhaler 2 puffs inhaled daily as needed for shortness of     breath. 5. Altace 10 mg p.o. b.i.d. 6. Creon 1 capsule three times a day. 7. Folic acid of 1 mg p.o. daily. 8. Glipizide 5 mg p.o. b.i.d. 9. Metoprolol XL 100 mg  p.o. daily. 10.Oxycodone 5 mg daily as needed for pain. 11.Sertraline 100 mg p.o. daily. 12.Zyrtec 10 mg daily as needed for allergy.  BRIEF HOSPITAL STAY.: 1. Anemia.  The patient has some hemoglobin AC, sickle cell disease.     The patient probably having hemolytic crisis as well as part of it     probably is splenic sequestration.  The patient came in with a     hemoglobin of 7.2, in the same day it was checked it was 6.4, the     very next morning it was 4.9.  At that time, decision was made for     transfusion.  Transfusion of 1 unit ordered.  The patient did have     a lot of antibodies making her transfusion very difficult.  A 1     unit found in house.  And then transfused with successful increase     of the hemoglobin to 5.6 and then went down again the very  next day     to 4.8.  At this point, Dr. Myna Palmer who recommended for her to be     transferred to [facility] as they do have a [facility] over there as well as she does have her regular     hematologist working over there.  According to him, the patient     might need exchange transfusion for her hemolytic crisis and from     the low hemoglobin, the splenomegaly, the thrombocytopenia, the     patient also might have a splenic sequestration.  [facility] called and the patient will be transferred over     there for further gynecological evaluation. 2. Acute kidney injury on background of chronic kidney disease with     hyperkalemia.  This is likely secondary to the hemolytic crisis.     The patient got Kayexalate this is likely secondary to hemolytic     crisis.  The patient's BUN and creatinine at time of admission     73/2.5 and went down to 33/1.6 at time of discharge with gentle IV     fluids hydration. 3. Hyperkalemia.  At the time of admission, potassium 6.6.  Likely     secondary to acute kidney injury and hemolysis.  The patient had     Kayexalate and hydration with IV fluids at the day of discharge is     4.3 which is normal within normal limits. 4. Diabetes mellitus type 2.  This seems pretty uncontrolled as the     patient is nonadherent to her medications.  The patient was started     on 5 units of Lantus upon admission and switched to 10 units at the     day of discharge.  Hoping this going to control her blood sugar.     Recommend to do sliding scale as well as carbohydrate-modified     diet. 5. Thrombocytopenia, per her this is chronic comes and goes whenever     she had hemolysis.  We do not have records here since 2002 with     that time her platelets count was 182.  This time, she was     presented with platelets count of 106 and it was going down slowly     and progressively, today is 50.  This can be part of the  splenic     sequestration.  The patient going to have full hematologic  evaluation to [facility]. 6. Antibody identification.  Antibodies in the blood.  The patient has     different antibodies which making her transfusion difficult.  The     patient has anti C.  E.  K.  FY3.  GOA.  DOA.  DISCHARGE LABORATORY DATA: 1. BMP, sodium 134, potassium 4.3, chloride 115, bicarb is 16, glucose     225, BUN 33, creatinine 1.6, cough since 7.9. 2. Reticulocyte count, reticulocyte percentage 9.7%, PRBC total RBCs     count is 1.9 million, absolute reticulocyte count is 188. 3. CBC, WBCs 5.4, hemoglobin 4.8, hematocrit 14.0, MCV 72, platelets     is 50.0.     Rachel Llano, MD     ME/MEDQ  D:  06/23/2011  T:  06/23/2011  Job:  [id number]  cc:   Rachel Palmer Palmer, M.D.  Electronically Signed by Rachel Palmer Palmer  on 07/03/2011 01:56:28 PM

## 2011-07-11 ENCOUNTER — Encounter: Payer: Self-pay | Admitting: *Deleted

## 2011-07-12 DIAGNOSIS — R197 Diarrhea, unspecified: Secondary | ICD-10-CM | POA: Insufficient documentation

## 2011-08-04 ENCOUNTER — Other Ambulatory Visit: Payer: Self-pay | Admitting: Hematology & Oncology

## 2011-08-04 ENCOUNTER — Ambulatory Visit: Payer: Medicare Other | Admitting: Hematology & Oncology

## 2011-08-04 ENCOUNTER — Encounter: Payer: Self-pay | Admitting: Hematology & Oncology

## 2011-08-04 ENCOUNTER — Other Ambulatory Visit (HOSPITAL_BASED_OUTPATIENT_CLINIC_OR_DEPARTMENT_OTHER): Payer: Medicare Other | Admitting: Lab

## 2011-08-04 ENCOUNTER — Ambulatory Visit (HOSPITAL_BASED_OUTPATIENT_CLINIC_OR_DEPARTMENT_OTHER): Payer: Medicare Other | Admitting: Hematology & Oncology

## 2011-08-04 DIAGNOSIS — N189 Chronic kidney disease, unspecified: Secondary | ICD-10-CM | POA: Insufficient documentation

## 2011-08-04 DIAGNOSIS — D5720 Sickle-cell/Hb-C disease without crisis: Secondary | ICD-10-CM | POA: Insufficient documentation

## 2011-08-04 DIAGNOSIS — D696 Thrombocytopenia, unspecified: Secondary | ICD-10-CM

## 2011-08-04 DIAGNOSIS — D631 Anemia in chronic kidney disease: Secondary | ICD-10-CM

## 2011-08-04 DIAGNOSIS — D571 Sickle-cell disease without crisis: Secondary | ICD-10-CM

## 2011-08-04 HISTORY — DX: Anemia in chronic kidney disease: D63.1

## 2011-08-04 HISTORY — DX: Sickle-cell/Hb-C disease without crisis: D57.20

## 2011-08-04 HISTORY — DX: Anemia in chronic kidney disease: N18.9

## 2011-08-04 LAB — CMP (CANCER CENTER ONLY)
AST: 17 U/L (ref 11–38)
Albumin: 3.7 g/dL (ref 3.3–5.5)
BUN, Bld: 23 mg/dL — ABNORMAL HIGH (ref 7–22)
CO2: 21 mEq/L (ref 18–33)
Calcium: 8.5 mg/dL (ref 8.0–10.3)
Chloride: 114 mEq/L — ABNORMAL HIGH (ref 98–108)
Creat: 1.7 mg/dl — ABNORMAL HIGH (ref 0.6–1.2)
Glucose, Bld: 191 mg/dL — ABNORMAL HIGH (ref 73–118)
Potassium: 4.4 mEq/L (ref 3.3–4.7)

## 2011-08-04 LAB — CBC WITH DIFFERENTIAL (CANCER CENTER ONLY)
BASO#: 0 10*3/uL (ref 0.0–0.2)
HCT: 22.3 % — ABNORMAL LOW (ref 34.8–46.6)
HGB: 7.9 g/dL — ABNORMAL LOW (ref 11.6–15.9)
LYMPH#: 2.3 10*3/uL (ref 0.9–3.3)
LYMPH%: 31.1 % (ref 14.0–48.0)
MCV: 74 fL — ABNORMAL LOW (ref 81–101)
MONO#: 0.6 10*3/uL (ref 0.1–0.9)
NEUT%: 58.4 % (ref 39.6–80.0)
WBC: 7.4 10*3/uL (ref 3.9–10.0)

## 2011-08-04 LAB — RETICULOCYTES (CHCC)
ABS Retic: 314 10*3/uL — ABNORMAL HIGH (ref 19.0–186.0)
RBC.: 3.14 MIL/uL — ABNORMAL LOW (ref 3.87–5.11)
Retic Ct Pct: 10 % — ABNORMAL HIGH (ref 0.4–2.3)

## 2011-08-04 LAB — CHCC SATELLITE - SMEAR

## 2011-08-04 LAB — FERRITIN: Ferritin: 1725 ng/mL — ABNORMAL HIGH (ref 10–291)

## 2011-08-04 LAB — IRON AND TIBC: TIBC: 284 ug/dL (ref 250–470)

## 2011-08-04 NOTE — Progress Notes (Signed)
This office note has been dictated. CSN: [id number]

## 2011-08-07 NOTE — Progress Notes (Signed)
CC:   Jacques Navy, MD  DIAGNOSES: 1. Hemoglobin [place] disease. 2. Chronic thrombocytopenia. 3. Anemia of renal insufficiency.  CURRENT THERAPY: 1. Procrit 40,000 units subcu q.3 weeks as needed for hemoglobin less     than 10. 2. Folic acid 1 mg p.o. daily.  INTERVAL HISTORY:  Rachel Palmer comes in for followup.  She actually looks quite good today.  I am pretty impressed with her recovery.  She really had a tough time back in September.  She was hospitalized.  She was admitted over to [facility]  She had severe hemolysis.  It was felt that this was a transfusion reaction.  She has been transfused quite a bit.  She has not had any problems with increased cough or shortness breath. There is no headache.  She does have chronic renal insufficiency.  Again, she is being followed at [facility] for a lot of her issues.  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  This does bother her.  Unfortunately she has not had this evaluated.  PHYSICAL EXAMINATION:  General:  This is a well-developed, well- nourished black female in no obvious distress.  Vital signs:  Show temperature of 96.9, pulse 84, respiratory rate 20, blood pressure 140/73.  Weight is 183.  Head and neck:  Exam shows a normocephalic, atraumatic skull.  There are no ocular or oral lesions.  Lymphs:  There are no palpable cervical, supraclavicular lymph nodes.  Lungs:  Are clear bilaterally.  Cardiovascular:  Regular rate and rhythm with a normal S1 and S2.  There are no murmurs, rubs or bruits.  Abdomen:  Soft with good bowel sounds.  There is no palpable abdominal mass.  There is no fluid wave.  No palpable hepatosplenomegaly.  Back:  No tenderness of the spine, ribs or hips.  Extremities;  shows no clubbing, cyanosis or edema.  Neurological:  Exam shows no focal neurological deficits.  LABORATORY STUDIES:  White cell count 7.4, hemoglobin 7.9, hematocrit 22.3, platelet count 82,000.  MCV is 74.  Her ferritin is 17 and 25.  Iron  saturation is 43%.  BUN is 23 and creatinine is 1.7.  IMPRESSION:  Rachel Palmer is a 70 year old African American female with hemoglobin [place] disease.  She does also have chronic renal insufficiency. We will go ahead and give her Procrit.  I want to try to avoid transfusing her.  Her ferritin is already up.  I think we may have to consider iron chelation at some point down the road if we do need to give her transfusions.  With each transfusion, we will give her Desferal.  We will have her come back every 3 weeks for her Procrit if necessary.  I will plan to see her back myself in January.  I am very pleased with how well she looks now.    ______________________________ Josph Macho, M.D. PRE/MEDQ  D:  08/05/2011  T:  08/05/2011  Job:  [id number]

## 2011-08-18 DIAGNOSIS — N184 Chronic kidney disease, stage 4 (severe): Secondary | ICD-10-CM | POA: Insufficient documentation

## 2011-08-18 DIAGNOSIS — D571 Sickle-cell disease without crisis: Secondary | ICD-10-CM | POA: Insufficient documentation

## 2011-08-18 DIAGNOSIS — K219 Gastro-esophageal reflux disease without esophagitis: Secondary | ICD-10-CM | POA: Insufficient documentation

## 2011-08-18 DIAGNOSIS — I10 Essential (primary) hypertension: Secondary | ICD-10-CM | POA: Insufficient documentation

## 2011-08-18 DIAGNOSIS — E119 Type 2 diabetes mellitus without complications: Secondary | ICD-10-CM | POA: Insufficient documentation

## 2011-08-18 DIAGNOSIS — N189 Chronic kidney disease, unspecified: Secondary | ICD-10-CM | POA: Insufficient documentation

## 2011-08-21 ENCOUNTER — Other Ambulatory Visit: Payer: Medicare Other | Admitting: Lab

## 2011-08-21 ENCOUNTER — Ambulatory Visit: Payer: Medicare Other

## 2011-08-25 ENCOUNTER — Other Ambulatory Visit (HOSPITAL_BASED_OUTPATIENT_CLINIC_OR_DEPARTMENT_OTHER): Payer: Medicare Other | Admitting: Lab

## 2011-08-25 ENCOUNTER — Ambulatory Visit (HOSPITAL_BASED_OUTPATIENT_CLINIC_OR_DEPARTMENT_OTHER): Payer: Medicare Other

## 2011-08-25 DIAGNOSIS — N289 Disorder of kidney and ureter, unspecified: Secondary | ICD-10-CM

## 2011-08-25 DIAGNOSIS — N189 Chronic kidney disease, unspecified: Secondary | ICD-10-CM

## 2011-08-25 DIAGNOSIS — D631 Anemia in chronic kidney disease: Secondary | ICD-10-CM

## 2011-08-25 DIAGNOSIS — D5720 Sickle-cell/Hb-C disease without crisis: Secondary | ICD-10-CM

## 2011-08-25 DIAGNOSIS — D649 Anemia, unspecified: Secondary | ICD-10-CM

## 2011-08-25 LAB — CBC WITH DIFFERENTIAL (CANCER CENTER ONLY)
BASO#: 0 10*3/uL (ref 0.0–0.2)
Eosinophils Absolute: 0.2 10*3/uL (ref 0.0–0.5)
HCT: 30.2 % — ABNORMAL LOW (ref 34.8–46.6)
HGB: 10.5 g/dL — ABNORMAL LOW (ref 11.6–15.9)
MCH: 25.5 pg — ABNORMAL LOW (ref 26.0–34.0)
MONO%: 8.1 % (ref 0.0–13.0)
NEUT#: 4.1 10*3/uL (ref 1.5–6.5)
NEUT%: 54.4 % (ref 39.6–80.0)
RBC: 4.12 10*6/uL (ref 3.70–5.32)

## 2011-08-25 MED ORDER — EPOETIN ALFA 40000 UNIT/ML IJ SOLN
40000.0000 [IU] | Freq: Once | INTRAMUSCULAR | Status: AC
  Administered 2011-08-25: 40000 [IU] via SUBCUTANEOUS

## 2011-09-15 ENCOUNTER — Ambulatory Visit: Payer: Medicare Other

## 2011-09-15 ENCOUNTER — Other Ambulatory Visit: Payer: Medicare Other | Admitting: Lab

## 2011-09-15 NOTE — Progress Notes (Signed)
Chart opeded in error. 

## 2011-10-05 ENCOUNTER — Ambulatory Visit (HOSPITAL_BASED_OUTPATIENT_CLINIC_OR_DEPARTMENT_OTHER): Payer: Medicare Other | Admitting: Hematology & Oncology

## 2011-10-05 ENCOUNTER — Other Ambulatory Visit (HOSPITAL_BASED_OUTPATIENT_CLINIC_OR_DEPARTMENT_OTHER): Payer: Medicare Other | Admitting: Lab

## 2011-10-05 DIAGNOSIS — N289 Disorder of kidney and ureter, unspecified: Secondary | ICD-10-CM

## 2011-10-05 DIAGNOSIS — D631 Anemia in chronic kidney disease: Secondary | ICD-10-CM

## 2011-10-05 DIAGNOSIS — D5720 Sickle-cell/Hb-C disease without crisis: Secondary | ICD-10-CM

## 2011-10-05 DIAGNOSIS — N189 Chronic kidney disease, unspecified: Secondary | ICD-10-CM

## 2011-10-05 DIAGNOSIS — R0602 Shortness of breath: Secondary | ICD-10-CM

## 2011-10-05 DIAGNOSIS — D649 Anemia, unspecified: Secondary | ICD-10-CM

## 2011-10-05 LAB — CBC WITH DIFFERENTIAL (CANCER CENTER ONLY)
BASO#: 0 10*3/uL (ref 0.0–0.2)
Eosinophils Absolute: 0.2 10*3/uL (ref 0.0–0.5)
HGB: 8.3 g/dL — ABNORMAL LOW (ref 11.6–15.9)
LYMPH%: 32.2 % (ref 14.0–48.0)
MCH: 24.1 pg — ABNORMAL LOW (ref 26.0–34.0)
MCHC: 34.6 g/dL (ref 32.0–36.0)
MCV: 70 fL — ABNORMAL LOW (ref 81–101)
MONO%: 9.1 % (ref 0.0–13.0)
Platelets: 90 10*3/uL — ABNORMAL LOW (ref 145–400)
RBC: 3.44 10*6/uL — ABNORMAL LOW (ref 3.70–5.32)

## 2011-10-05 MED ORDER — EPOETIN ALFA 40000 UNIT/ML IJ SOLN
40000.0000 [IU] | Freq: Once | INTRAMUSCULAR | Status: AC
  Administered 2011-10-05: 40000 [IU] via SUBCUTANEOUS

## 2011-10-05 NOTE — Progress Notes (Signed)
CC:   Rachel Navy, MD  DIAGNOSES: 1. Hemoglobin [place] disease. 2. Thrombocytopenia, chronic/benign. 3. 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 4. Diabetes.  CURRENT THERAPY: 1. Folic acid 1 mg p.o. daily. 2. Procrit 40,000 units subcu q.3 weeks (to be changed to q.2 weeks).  INTERIM HISTORY:  Rachel Palmer comes in for followup.  She actually looks quite good.  She has not had any complaints.  She does get short of breath with exertion.  She has been evaluated for cardiopulmonary issues.  It is possible that she might be having dyspnea secondary to her anemia.  When we last saw her in November, her iron saturation was 43%.  Her iron was 122.  She is watching her blood sugars.  She is trying to be a little bit more diligent with this.  We will see what her blood sugars are today.  When we last saw her in November, her ferritin was 1725. She last got Procrit before the holidays so it has been 3 or 4 weeks.  Her appetite has been good.  She has had no change in bowel or bladder habits.  She has had no fever.  She has had no bony pain.  There have been no problems with respect to her sickle cell.  PHYSICAL EXAM:  General:  This is a well-developed, well-nourished black female in no obvious distress.  Vital Signs:  Temperature 97.1, pulse 80, respiratory rate 18, blood pressure 129/81.  Weight is 185. Head/Neck:  Exam shows a normocephalic, atraumatic skull.  There are no ocular or oral lesions.  There are no palpable cervical or supraclavicular lymph nodes.  Lungs:  Clear to percussion and auscultation bilaterally. Cardiac:  Regular rate and rhythm with a normal S1, S2.  There are no murmurs, rubs or bruits.  Abdomen:  Soft with good bowel sounds.  There is no fluid wave.  There is no palpable hepatosplenomegaly.  Back:  No tenderness over the spine, ribs, or hips. Extremities:  No clubbing, cyanosis or edema.  Neurologic:  Exam shows no focal neurological deficits.  LABORATORY  STUDIES:  White cell count 7.3, hemoglobin 8.3, hematocrit 24, platelet count 90.  IMPRESSION:  Rachel Palmer is a 71 year old African American female with hemoglobin  disease.  She really does look great.  This is probably the best I have seen her look since she came back into town.  I do want to try to get her blood count up a little bit more.  We are try to give her Procrit every 2 weeks now.  Hopefully, we can try to get her hemoglobin about 10.  She is on folic acid and doing well with folic acid.  We will plan to see her back ourselves in about 6 weeks time.    ______________________________ Josph Macho, M.D. PRE/MEDQ  D:  10/05/2011  T:  10/05/2011  Job:  [id number]  ADDENDUM: Ferritin is 1520.  %Sat is 22. Retic is 4.6%.

## 2011-10-05 NOTE — Progress Notes (Signed)
This office note has been dictated.

## 2011-10-06 LAB — FERRITIN: Ferritin: 1520 ng/mL — ABNORMAL HIGH (ref 10–291)

## 2011-10-06 LAB — RETICULOCYTES (CHCC)
RBC.: 3.52 MIL/uL — ABNORMAL LOW (ref 3.87–5.11)
Retic Ct Pct: 4.6 % — ABNORMAL HIGH (ref 0.4–2.3)

## 2011-10-06 LAB — IRON AND TIBC: UIBC: 198 ug/dL (ref 125–400)

## 2011-10-19 ENCOUNTER — Other Ambulatory Visit (HOSPITAL_BASED_OUTPATIENT_CLINIC_OR_DEPARTMENT_OTHER): Payer: Medicare Other | Admitting: Lab

## 2011-10-19 ENCOUNTER — Ambulatory Visit (HOSPITAL_BASED_OUTPATIENT_CLINIC_OR_DEPARTMENT_OTHER): Payer: Medicare Other

## 2011-10-19 ENCOUNTER — Telehealth: Payer: Self-pay | Admitting: Hematology & Oncology

## 2011-10-19 DIAGNOSIS — D649 Anemia, unspecified: Secondary | ICD-10-CM

## 2011-10-19 DIAGNOSIS — D5720 Sickle-cell/Hb-C disease without crisis: Secondary | ICD-10-CM

## 2011-10-19 DIAGNOSIS — D631 Anemia in chronic kidney disease: Secondary | ICD-10-CM

## 2011-10-19 DIAGNOSIS — D571 Sickle-cell disease without crisis: Secondary | ICD-10-CM

## 2011-10-19 DIAGNOSIS — N289 Disorder of kidney and ureter, unspecified: Secondary | ICD-10-CM

## 2011-10-19 LAB — CBC WITH DIFFERENTIAL (CANCER CENTER ONLY)
BASO%: 0.7 % (ref 0.0–2.0)
LYMPH%: 32.9 % (ref 14.0–48.0)
MCV: 70 fL — ABNORMAL LOW (ref 81–101)
MONO#: 0.5 10*3/uL (ref 0.1–0.9)
MONO%: 8.9 % (ref 0.0–13.0)
Platelets: 110 10*3/uL — ABNORMAL LOW (ref 145–400)
RDW: 21.2 % — ABNORMAL HIGH (ref 11.1–15.7)
WBC: 6 10*3/uL (ref 3.9–10.0)

## 2011-10-19 MED ORDER — EPOETIN ALFA 40000 UNIT/ML IJ SOLN
40000.0000 [IU] | Freq: Once | INTRAMUSCULAR | Status: AC
  Administered 2011-10-19: 40000 [IU] via SUBCUTANEOUS

## 2011-10-19 NOTE — Telephone Encounter (Signed)
Mailed 10-2012 schedule to pt

## 2011-11-02 ENCOUNTER — Ambulatory Visit: Payer: Medicare Other

## 2011-11-02 ENCOUNTER — Other Ambulatory Visit (HOSPITAL_BASED_OUTPATIENT_CLINIC_OR_DEPARTMENT_OTHER): Payer: Medicare Other | Admitting: Lab

## 2011-11-02 ENCOUNTER — Ambulatory Visit (HOSPITAL_BASED_OUTPATIENT_CLINIC_OR_DEPARTMENT_OTHER): Payer: Medicare Other

## 2011-11-02 DIAGNOSIS — D649 Anemia, unspecified: Secondary | ICD-10-CM

## 2011-11-02 DIAGNOSIS — D5720 Sickle-cell/Hb-C disease without crisis: Secondary | ICD-10-CM

## 2011-11-02 DIAGNOSIS — N289 Disorder of kidney and ureter, unspecified: Secondary | ICD-10-CM

## 2011-11-02 DIAGNOSIS — D631 Anemia in chronic kidney disease: Secondary | ICD-10-CM

## 2011-11-02 DIAGNOSIS — N189 Chronic kidney disease, unspecified: Secondary | ICD-10-CM

## 2011-11-02 LAB — CBC WITH DIFFERENTIAL (CANCER CENTER ONLY)
BASO%: 0.8 % (ref 0.0–2.0)
HCT: 29.7 % — ABNORMAL LOW (ref 34.8–46.6)
LYMPH#: 2 10*3/uL (ref 0.9–3.3)
MONO#: 0.5 10*3/uL (ref 0.1–0.9)
NEUT#: 3.8 10*3/uL (ref 1.5–6.5)
Platelets: 89 10*3/uL — ABNORMAL LOW (ref 145–400)
RDW: 21.1 % — ABNORMAL HIGH (ref 11.1–15.7)
WBC: 6.5 10*3/uL (ref 3.9–10.0)

## 2011-11-02 MED ORDER — EPOETIN ALFA 40000 UNIT/ML IJ SOLN
40000.0000 [IU] | Freq: Once | INTRAMUSCULAR | Status: AC
  Administered 2011-11-02: 40000 [IU] via SUBCUTANEOUS

## 2011-11-16 ENCOUNTER — Other Ambulatory Visit (HOSPITAL_BASED_OUTPATIENT_CLINIC_OR_DEPARTMENT_OTHER): Payer: Medicare Other | Admitting: Lab

## 2011-11-16 ENCOUNTER — Ambulatory Visit (HOSPITAL_BASED_OUTPATIENT_CLINIC_OR_DEPARTMENT_OTHER): Payer: Medicare Other

## 2011-11-16 ENCOUNTER — Ambulatory Visit (HOSPITAL_BASED_OUTPATIENT_CLINIC_OR_DEPARTMENT_OTHER): Payer: Medicare Other | Admitting: Hematology & Oncology

## 2011-11-16 DIAGNOSIS — D631 Anemia in chronic kidney disease: Secondary | ICD-10-CM

## 2011-11-16 DIAGNOSIS — D571 Sickle-cell disease without crisis: Secondary | ICD-10-CM

## 2011-11-16 DIAGNOSIS — N289 Disorder of kidney and ureter, unspecified: Secondary | ICD-10-CM

## 2011-11-16 DIAGNOSIS — D649 Anemia, unspecified: Secondary | ICD-10-CM

## 2011-11-16 DIAGNOSIS — D5720 Sickle-cell/Hb-C disease without crisis: Secondary | ICD-10-CM

## 2011-11-16 LAB — CBC WITH DIFFERENTIAL (CANCER CENTER ONLY)
BASO%: 0.8 % (ref 0.0–2.0)
Eosinophils Absolute: 0.2 10*3/uL (ref 0.0–0.5)
HCT: 27.4 % — ABNORMAL LOW (ref 34.8–46.6)
LYMPH#: 2 10*3/uL (ref 0.9–3.3)
MONO#: 0.5 10*3/uL (ref 0.1–0.9)
Platelets: 76 10*3/uL — ABNORMAL LOW (ref 145–400)
RBC: 4.01 10*6/uL (ref 3.70–5.32)
RDW: 21.1 % — ABNORMAL HIGH (ref 11.1–15.7)
WBC: 6.6 10*3/uL (ref 3.9–10.0)

## 2011-11-16 MED ORDER — EPOETIN ALFA 40000 UNIT/ML IJ SOLN
40000.0000 [IU] | Freq: Once | INTRAMUSCULAR | Status: AC
  Administered 2011-11-16: 40000 [IU] via SUBCUTANEOUS

## 2011-11-16 NOTE — Progress Notes (Signed)
This office note has been dictated.

## 2011-11-16 NOTE — Progress Notes (Signed)
CC:   Rachel Navy, MD  DIAGNOSES: 1. Hemoglobin [place] disease. 2. Chronic thrombocytopenia. 3. 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 4. Diabetes.  CURRENT THERAPY: 1. Procrit 40,000 units subcutaneously q.2 weeks for a hemoglobin less     than 11. 2. Folic acid 1 mg p.o. daily.  INTERIM HISTORY:  Rachel Palmer comes in for followup.  She is feeling a little bit more tired.  She did go to [place] to see some family recently.  She had a good time up there, although she did get more tired.  She has responded well to Procrit.  She, I think, has not gotten it for a few months.  She has had no problems with bleeding.  She says that her diabetes is under fairly good control.  When we last saw her in January, her ferritin was 1,520.  Iron saturation was only 22%.  She has not had any issues with her bowels or bladder.  There has been no diarrhea.  She has had some occasional leg swelling.  There have been no rashes.  PHYSICAL EXAMINATION:  General Appearance:  This is a fairly well- developed, well-nourished, black female in no obvious distress.  Vital Signs:  96.8, pulse 83, respiratory rate 16, blood pressure 116/74. Weight is 184.  Head and Neck Exam:  A normocephalic, atraumatic skull. There are no ocular or oral lesions.  There are no palpable cervical or supraclavicular lymph nodes.  Lungs:  Clear bilaterally.  Cardiac Exam: Regular rate and rhythm with a normal S1 and S2.  There are no murmurs, rubs, or bruits.  Abdominal Exam:  Soft with good bowel sounds.  There is no palpable abdominal mass.  There is no palpable hepatosplenomegaly. Back Exam:  No tenderness over the spine, ribs, or hips.  Extremities: No clubbing, cyanosis, or edema.  Neurological Exam:  No focal neurological deficits.  LABORATORY STUDIES:  White cell count is 6.6, hemoglobin 9.4, hematocrit 27.4, platelet count 76,000.  IMPRESSION:  Rachel Palmer is a 71 year old African American female  with hemoglobin [place] disease.  She has chronic hemolysis.  Folic acid, hopefully, is helpful to this to some degree.  We will go ahead and give her Procrit today.  I tried to reassure Rachel Palmer that her blood count is going to fluctuate.  That is the nature of sickle cell anemia.  She is going to have periods of more hemolysis.  Again, this is where the folic acid will help [place] out.  We will go ahead and plan to have her come back every couple weeks for her Procrit if necessary.  I will plan to see her back myself in about 6 weeks.    ______________________________ Josph Macho, M.D. PRE/MEDQ  D:  11/16/2011  T:  11/16/2011  Job:  [id number]  ADDENDUM:  Ferritin is 1508. %Sat is 30%.

## 2011-11-21 LAB — HEMOGLOBINOPATHY EVALUATION
Hemoglobin Other: 43.5 % — ABNORMAL HIGH
Hgb A2 Quant: 3.3 % — ABNORMAL HIGH (ref 2.2–3.2)
Hgb S Quant: 50.1 % — ABNORMAL HIGH

## 2011-11-21 LAB — COMPREHENSIVE METABOLIC PANEL
Albumin: 4.5 g/dL (ref 3.5–5.2)
BUN: 27 mg/dL — ABNORMAL HIGH (ref 6–23)
Calcium: 9 mg/dL (ref 8.4–10.5)
Chloride: 112 mEq/L (ref 96–112)
Glucose, Bld: 158 mg/dL — ABNORMAL HIGH (ref 70–99)
Potassium: 4.7 mEq/L (ref 3.5–5.3)
Total Protein: 6.8 g/dL (ref 6.0–8.3)

## 2011-11-21 LAB — IRON AND TIBC
TIBC: 264 ug/dL (ref 250–470)
UIBC: 184 ug/dL (ref 125–400)

## 2011-11-30 ENCOUNTER — Other Ambulatory Visit (HOSPITAL_BASED_OUTPATIENT_CLINIC_OR_DEPARTMENT_OTHER): Payer: Medicare Other | Admitting: Lab

## 2011-11-30 ENCOUNTER — Ambulatory Visit (HOSPITAL_BASED_OUTPATIENT_CLINIC_OR_DEPARTMENT_OTHER): Payer: Medicare Other

## 2011-11-30 VITALS — BP 111/76 | HR 74 | Temp 96.7°F

## 2011-11-30 DIAGNOSIS — D5720 Sickle-cell/Hb-C disease without crisis: Secondary | ICD-10-CM

## 2011-11-30 DIAGNOSIS — D631 Anemia in chronic kidney disease: Secondary | ICD-10-CM

## 2011-11-30 DIAGNOSIS — N289 Disorder of kidney and ureter, unspecified: Secondary | ICD-10-CM

## 2011-11-30 DIAGNOSIS — D649 Anemia, unspecified: Secondary | ICD-10-CM

## 2011-11-30 DIAGNOSIS — N189 Chronic kidney disease, unspecified: Secondary | ICD-10-CM

## 2011-11-30 LAB — CBC WITH DIFFERENTIAL (CANCER CENTER ONLY)
Eosinophils Absolute: 0.3 10*3/uL (ref 0.0–0.5)
HCT: 28 % — ABNORMAL LOW (ref 34.8–46.6)
LYMPH%: 34.8 % (ref 14.0–48.0)
MCV: 70 fL — ABNORMAL LOW (ref 81–101)
MONO#: 0.5 10*3/uL (ref 0.1–0.9)
NEUT%: 52.2 % (ref 39.6–80.0)
RBC: 4.03 10*6/uL (ref 3.70–5.32)
RDW: 21.2 % — ABNORMAL HIGH (ref 11.1–15.7)
WBC: 6.8 10*3/uL (ref 3.9–10.0)

## 2011-11-30 MED ORDER — EPOETIN ALFA 40000 UNIT/ML IJ SOLN
40000.0000 [IU] | Freq: Once | INTRAMUSCULAR | Status: AC
  Administered 2011-11-30: 40000 [IU] via SUBCUTANEOUS

## 2011-12-14 ENCOUNTER — Ambulatory Visit (HOSPITAL_BASED_OUTPATIENT_CLINIC_OR_DEPARTMENT_OTHER): Payer: Medicare Other

## 2011-12-14 ENCOUNTER — Other Ambulatory Visit: Payer: Medicare Other | Admitting: Lab

## 2011-12-14 VITALS — BP 139/74 | HR 75 | Temp 97.0°F

## 2011-12-14 DIAGNOSIS — D649 Anemia, unspecified: Secondary | ICD-10-CM

## 2011-12-14 DIAGNOSIS — N289 Disorder of kidney and ureter, unspecified: Secondary | ICD-10-CM

## 2011-12-14 DIAGNOSIS — D631 Anemia in chronic kidney disease: Secondary | ICD-10-CM

## 2011-12-14 DIAGNOSIS — D5720 Sickle-cell/Hb-C disease without crisis: Secondary | ICD-10-CM

## 2011-12-14 LAB — CBC WITH DIFFERENTIAL (CANCER CENTER ONLY)
BASO%: 0.5 % (ref 0.0–2.0)
EOS%: 3 % (ref 0.0–7.0)
HCT: 30.2 % — ABNORMAL LOW (ref 34.8–46.6)
LYMPH%: 35.3 % (ref 14.0–48.0)
MCHC: 33.8 g/dL (ref 32.0–36.0)
MCV: 69 fL — ABNORMAL LOW (ref 81–101)
NEUT%: 53.7 % (ref 39.6–80.0)
RDW: 21.5 % — ABNORMAL HIGH (ref 11.1–15.7)

## 2011-12-14 MED ORDER — EPOETIN ALFA 40000 UNIT/ML IJ SOLN
40000.0000 [IU] | Freq: Once | INTRAMUSCULAR | Status: AC
  Administered 2011-12-14: 40000 [IU] via SUBCUTANEOUS

## 2011-12-14 NOTE — Patient Instructions (Signed)
Epoetin Alfa injection What is this medicine? EPOETIN ALFA (e POE e tin AL fa) helps your body make more red blood cells. This medicine is used to treat anemia caused by chronic kidney failure, cancer chemotherapy, or HIV-therapy. It may also be used before surgery if you have anemia. This medicine may be used for other purposes; ask your health care provider or pharmacist if you have questions. What should I tell my health care provider before I take this medicine? They need to know if you have any of these conditions: -blood clotting disorders -cancer patient not on chemotherapy -cystic fibrosis -heart disease, such as angina or heart failure -hemoglobin level of 12 g/dL or greater -high blood pressure -low levels of folate, iron, or vitamin B12 -seizures -an unusual or allergic reaction to erythropoietin, albumin, benzyl alcohol, hamster proteins, other medicines, foods, dyes, or preservatives -pregnant or trying to get pregnant -breast-feeding How should I use this medicine? This medicine is for injection into a vein or under the skin. It is usually given by a health care professional in a hospital or clinic setting. If you get this medicine at home, you will be taught how to prepare and give this medicine. Use exactly as directed. Take your medicine at regular intervals. Do not take your medicine more often than directed. It is important that you put your used needles and syringes in a special sharps container. Do not put them in a trash can. If you do not have a sharps container, call your pharmacist or healthcare provider to get one. Talk to your pediatrician regarding the use of this medicine in children. While this drug may be prescribed for selected conditions, precautions do apply. Overdosage: If you think you have taken too much of this medicine contact a poison control center or emergency room at once. NOTE: This medicine is only for you. Do not share this medicine with  others. What if I miss a dose? If you miss a dose, take it as soon as you can. If it is almost time for your next dose, take only that dose. Do not take double or extra doses. What may interact with this medicine? Do not take this medicine with any of the following medications: -darbepoetin alfa This list may not describe all possible interactions. Give your health care provider a list of all the medicines, herbs, non-prescription drugs, or dietary supplements you use. Also tell them if you smoke, drink alcohol, or use illegal drugs. Some items may interact with your medicine. What should I watch for while using this medicine? Visit your prescriber or health care professional for regular checks on your progress and for the needed blood tests and blood pressure measurements. It is especially important for the doctor to make sure your hemoglobin level is in the desired range, to limit the risk of potential side effects and to give you the best benefit. Keep all appointments for any recommended tests. Check your blood pressure as directed. Ask your doctor what your blood pressure should be and when you should contact him or her. As your body makes more red blood cells, you may need to take iron, folic acid, or vitamin B supplements. Ask your doctor or health care provider which products are right for you. If you have kidney disease continue dietary restrictions, even though this medication can make you feel better. Talk with your doctor or health care professional about the foods you eat and the vitamins that you take. What side effects may I notice   from receiving this medicine? Side effects that you should report to your doctor or health care professional as soon as possible: -allergic reactions like skin rash, itching or hives, swelling of the face, lips, or tongue -breathing problems -changes in vision -chest pain -confusion, trouble speaking or understanding -feeling faint or lightheaded,  falls -high blood pressure -muscle aches or pains -pain, swelling, warmth in the leg -rapid weight gain -severe headaches -sudden numbness or weakness of the face, arm or leg -trouble walking, dizziness, loss of balance or coordination -seizures (convulsions) -swelling of the ankles, feet, hands -unusually weak or tired Side effects that usually do not require medical attention (report to your doctor or health care professional if they continue or are bothersome): -diarrhea -fever, chills (flu-like symptoms) -headaches -nausea, vomiting -redness, stinging, or swelling at site where injected This list may not describe all possible side effects. Call your doctor for medical advice about side effects. You may report side effects to FDA at 1-800-FDA-1088. Where should I keep my medicine? Keep out of the reach of children. Store in a refrigerator between 2 and 8 degrees C (36 and 46 degrees F). Do not freeze or shake. Throw away any unused portion if using a single-dose vial. Multi-dose vials can be kept in the refrigerator for up to 21 days after the initial dose. Throw away unused medicine. NOTE: This sheet is a summary. It may not cover all possible information. If you have questions about this medicine, talk to your doctor, pharmacist, or health care provider.  2012, [employer]. (08/18/2008 10:25:44 AM) 

## 2011-12-18 DIAGNOSIS — M1710 Unilateral primary osteoarthritis, unspecified knee: Secondary | ICD-10-CM | POA: Insufficient documentation

## 2011-12-18 DIAGNOSIS — M25569 Pain in unspecified knee: Secondary | ICD-10-CM | POA: Insufficient documentation

## 2011-12-28 ENCOUNTER — Ambulatory Visit: Payer: Medicare Other | Admitting: Hematology & Oncology

## 2011-12-28 ENCOUNTER — Other Ambulatory Visit (HOSPITAL_BASED_OUTPATIENT_CLINIC_OR_DEPARTMENT_OTHER): Payer: Medicare Other | Admitting: Lab

## 2012-01-26 ENCOUNTER — Telehealth: Payer: Self-pay | Admitting: Hematology & Oncology

## 2012-01-26 NOTE — Telephone Encounter (Signed)
Pt called and scheduled 5-31 appointment from no show

## 2012-02-16 ENCOUNTER — Ambulatory Visit (HOSPITAL_BASED_OUTPATIENT_CLINIC_OR_DEPARTMENT_OTHER): Payer: Medicare Other

## 2012-02-16 ENCOUNTER — Encounter: Payer: Self-pay | Admitting: *Deleted

## 2012-02-16 ENCOUNTER — Other Ambulatory Visit: Payer: Medicare Other | Admitting: Lab

## 2012-02-16 ENCOUNTER — Ambulatory Visit (HOSPITAL_BASED_OUTPATIENT_CLINIC_OR_DEPARTMENT_OTHER): Payer: Medicare Other | Admitting: Hematology & Oncology

## 2012-02-16 VITALS — BP 107/70 | HR 89 | Temp 97.1°F | Ht 65.0 in | Wt 184.0 lb

## 2012-02-16 DIAGNOSIS — N289 Disorder of kidney and ureter, unspecified: Secondary | ICD-10-CM

## 2012-02-16 DIAGNOSIS — D571 Sickle-cell disease without crisis: Secondary | ICD-10-CM

## 2012-02-16 DIAGNOSIS — D649 Anemia, unspecified: Secondary | ICD-10-CM

## 2012-02-16 DIAGNOSIS — R5381 Other malaise: Secondary | ICD-10-CM

## 2012-02-16 DIAGNOSIS — D631 Anemia in chronic kidney disease: Secondary | ICD-10-CM

## 2012-02-16 DIAGNOSIS — D5720 Sickle-cell/Hb-C disease without crisis: Secondary | ICD-10-CM

## 2012-02-16 LAB — CBC WITH DIFFERENTIAL (CANCER CENTER ONLY)
BASO#: 0.1 10*3/uL (ref 0.0–0.2)
Eosinophils Absolute: 0.3 10*3/uL (ref 0.0–0.5)
HGB: 7.1 g/dL — ABNORMAL LOW (ref 11.6–15.9)
MCH: 24.4 pg — ABNORMAL LOW (ref 26.0–34.0)
MCV: 71 fL — ABNORMAL LOW (ref 81–101)
MONO#: 0.8 10*3/uL (ref 0.1–0.9)
MONO%: 8.2 % (ref 0.0–13.0)
NEUT#: 5.3 10*3/uL (ref 1.5–6.5)
Platelets: 57 10*3/uL — ABNORMAL LOW (ref 145–400)
RBC: 2.91 10*6/uL — ABNORMAL LOW (ref 3.70–5.32)
WBC: 9.8 10*3/uL (ref 3.9–10.0)

## 2012-02-16 MED ORDER — EPOETIN ALFA 40000 UNIT/ML IJ SOLN
40000.0000 [IU] | Freq: Once | INTRAMUSCULAR | Status: AC
  Administered 2012-02-16: 40000 [IU] via SUBCUTANEOUS

## 2012-02-16 NOTE — Progress Notes (Signed)
This office note has been dictated.

## 2012-02-16 NOTE — Progress Notes (Signed)
CC:   Jacques Navy, MD  DIAGNOSES: 1. Hemoglobin [place] disease. 2. Thrombocytopenia, chronic. 3. 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 4. Diabetes mellitus.  CURRENT THERAPY: 1. Procrit 40,000 units subcu q.2 weeks for hemoglobin less than 10. 2. Folic acid 1 mg p.o. daily.  INTERIM HISTORY:  Ms. Allport comes in for followup.  As always, she goes up to [place], [place].  I think she was up there Memorial Day.  She probably will be going up there sometime this summer.  She last got Procrit probably back in early April.  Again, she has been traveling.  She states she does feel tired.  She has had no obvious bleeding.  She has had no problems with her diabetes from what she tells me.  When we last checked her iron, her ferritin was 1500.  Her iron saturation was only 30%.  PHYSICAL EXAMINATION:  General:  This is a well-developed, well- nourished black female in no obvious distress.  Vital signs:  97.1, pulse 89, respiratory rate 18, blood pressure 107/70.  Weight is 184. Head and neck:  Exam shows a normocephalic, atraumatic skull.  There are no ocular or oral lesions.  There are no palpable cervical or supraclavicular lymph nodes.  Lungs:  Clear bilaterally.  Cardiac: Regular rate and rhythm with a normal S1 and S2.  There are no murmurs, rubs or bruits.  Abdomen:  Soft with good bowel sounds.  There is no palpable abdominal mass.  There is no fluid wave.  There is no palpable hepatosplenomegaly.  Extremities:  Shows some trace edema in her legs bilaterally.  She has good range of motion of her joints.  Skin:  No rashes, ecchymoses or petechiae.  Neurological:  No focal neurological deficits.  LABORATORY STUDIES:  White cell count 9.8, hemoglobin 7.1, hematocrit 28.7, platelet count 57,000.  IMPRESSION:  Ms. Nowakowski is a 71 year old African American female with hemoglobin [place] disease.  She actually has done quite well with this.  I think a lot of her anemia is more related to  her renal insufficiency.  I am not sure if she is really taking the folic acid.  Again, she has not had Procrit now for about 2 months.  We have got to really get her back on a 2 week schedule.  This does work for her.  I will plan to see her back in a month.  Will have to stay aggressive with her anemia.  I do not want to transfuse her.  She is very difficult to transfuse because of multiple antibodies.  I spent a good 40 minutes or so with Ms. Chestine Spore today.    ______________________________ Josph Macho, M.D. PRE/MEDQ  D:  02/16/2012  T:  02/16/2012  Job:  [id number]

## 2012-02-23 LAB — COMPREHENSIVE METABOLIC PANEL
Albumin: 4.4 g/dL (ref 3.5–5.2)
Alkaline Phosphatase: 110 U/L (ref 39–117)
BUN: 39 mg/dL — ABNORMAL HIGH (ref 6–23)
CO2: 16 mEq/L — ABNORMAL LOW (ref 19–32)
Calcium: 8.7 mg/dL (ref 8.4–10.5)
Glucose, Bld: 191 mg/dL — ABNORMAL HIGH (ref 70–99)
Potassium: 4.8 mEq/L (ref 3.5–5.3)
Sodium: 140 mEq/L (ref 135–145)
Total Protein: 6.9 g/dL (ref 6.0–8.3)

## 2012-02-23 LAB — FERRITIN: Ferritin: 1853 ng/mL — ABNORMAL HIGH (ref 10–291)

## 2012-02-23 LAB — HEMOGLOBINOPATHY EVALUATION
Hemoglobin Other: 43.6 % — ABNORMAL HIGH
Hgb A: 0 % — ABNORMAL LOW (ref 96.8–97.8)
Hgb S Quant: 50.1 % — ABNORMAL HIGH

## 2012-02-23 LAB — HGB ELECTROPHORESIS REFLEXED REPORT
Hemoglobin A - HGBRFX: 0 % — ABNORMAL LOW (ref >96.0)
Hemoglobin F - HGBRFX: 1.8 % (ref <2.0)
Hemoglobin S - HGBRFX: 50 % — ABNORMAL HIGH

## 2012-02-23 LAB — IRON AND TIBC
Iron: 101 ug/dL (ref 42–145)
UIBC: 174 ug/dL (ref 125–400)

## 2012-02-23 LAB — RETICULOCYTES (CHCC): Retic Ct Pct: 5.5 % — ABNORMAL HIGH (ref 0.4–2.3)

## 2012-03-01 ENCOUNTER — Ambulatory Visit (HOSPITAL_BASED_OUTPATIENT_CLINIC_OR_DEPARTMENT_OTHER): Payer: Medicare Other

## 2012-03-01 ENCOUNTER — Other Ambulatory Visit (HOSPITAL_BASED_OUTPATIENT_CLINIC_OR_DEPARTMENT_OTHER): Payer: Medicare Other | Admitting: Lab

## 2012-03-01 DIAGNOSIS — N189 Chronic kidney disease, unspecified: Secondary | ICD-10-CM

## 2012-03-01 DIAGNOSIS — D649 Anemia, unspecified: Secondary | ICD-10-CM

## 2012-03-01 LAB — CBC WITH DIFFERENTIAL (CANCER CENTER ONLY)
BASO#: 0 10*3/uL (ref 0.0–0.2)
Eosinophils Absolute: 0.2 10*3/uL (ref 0.0–0.5)
HGB: 8.7 g/dL — ABNORMAL LOW (ref 11.6–15.9)
MCH: 25.4 pg — ABNORMAL LOW (ref 26.0–34.0)
MONO#: 0.6 10*3/uL (ref 0.1–0.9)
NEUT#: 4.3 10*3/uL (ref 1.5–6.5)
Platelets: 112 10*3/uL — ABNORMAL LOW (ref 145–400)
RBC: 3.43 10*6/uL — ABNORMAL LOW (ref 3.70–5.32)
WBC: 7.9 10*3/uL (ref 3.9–10.0)

## 2012-03-01 LAB — COMPREHENSIVE METABOLIC PANEL
ALT: 8 U/L (ref 0–35)
CO2: 19 mEq/L (ref 19–32)
Calcium: 9 mg/dL (ref 8.4–10.5)
Chloride: 110 mEq/L (ref 96–112)
Creatinine, Ser: 1.72 mg/dL — ABNORMAL HIGH (ref 0.50–1.10)
Total Protein: 7.2 g/dL (ref 6.0–8.3)

## 2012-03-01 LAB — TECHNOLOGIST REVIEW CHCC SATELLITE: Tech Review: 3

## 2012-03-01 MED ORDER — EPOETIN ALFA 40000 UNIT/ML IJ SOLN
40000.0000 [IU] | Freq: Once | INTRAMUSCULAR | Status: AC
  Administered 2012-03-01: 40000 [IU] via SUBCUTANEOUS

## 2012-03-14 ENCOUNTER — Ambulatory Visit (HOSPITAL_BASED_OUTPATIENT_CLINIC_OR_DEPARTMENT_OTHER): Payer: Medicare Other | Admitting: Hematology & Oncology

## 2012-03-14 ENCOUNTER — Other Ambulatory Visit (HOSPITAL_BASED_OUTPATIENT_CLINIC_OR_DEPARTMENT_OTHER): Payer: Medicare Other | Admitting: Lab

## 2012-03-14 ENCOUNTER — Ambulatory Visit (HOSPITAL_BASED_OUTPATIENT_CLINIC_OR_DEPARTMENT_OTHER): Payer: Medicare Other

## 2012-03-14 VITALS — BP 125/77 | HR 69 | Temp 97.0°F | Ht 65.0 in | Wt 186.0 lb

## 2012-03-14 DIAGNOSIS — D696 Thrombocytopenia, unspecified: Secondary | ICD-10-CM

## 2012-03-14 DIAGNOSIS — D649 Anemia, unspecified: Secondary | ICD-10-CM

## 2012-03-14 DIAGNOSIS — D5720 Sickle-cell/Hb-C disease without crisis: Secondary | ICD-10-CM

## 2012-03-14 DIAGNOSIS — D631 Anemia in chronic kidney disease: Secondary | ICD-10-CM

## 2012-03-14 DIAGNOSIS — N289 Disorder of kidney and ureter, unspecified: Secondary | ICD-10-CM

## 2012-03-14 DIAGNOSIS — E119 Type 2 diabetes mellitus without complications: Secondary | ICD-10-CM

## 2012-03-14 DIAGNOSIS — N189 Chronic kidney disease, unspecified: Secondary | ICD-10-CM

## 2012-03-14 LAB — CBC WITH DIFFERENTIAL (CANCER CENTER ONLY)
Eosinophils Absolute: 0.1 10*3/uL (ref 0.0–0.5)
MCV: 74 fL — ABNORMAL LOW (ref 81–101)
MONO#: 0.4 10*3/uL (ref 0.1–0.9)
NEUT#: 2.8 10*3/uL (ref 1.5–6.5)
Platelets: 101 10*3/uL — ABNORMAL LOW (ref 145–400)
RBC: 4.03 10*6/uL (ref 3.70–5.32)
WBC: 5.3 10*3/uL (ref 3.9–10.0)

## 2012-03-14 MED ORDER — EPOETIN ALFA 40000 UNIT/ML IJ SOLN
40000.0000 [IU] | Freq: Once | INTRAMUSCULAR | Status: AC
  Administered 2012-03-14: 40000 [IU] via SUBCUTANEOUS

## 2012-03-14 NOTE — Progress Notes (Signed)
This office note has been dictated.

## 2012-03-15 NOTE — Progress Notes (Signed)
CC:   Jacques Navy, MD  DIAGNOSES: 1. Hemoglobin [place] disease. 2. Thrombocytopenia, chronic. 3. Insulin dependent diabetes. 4. 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  CURRENT THERAPY: 1. Procrit 40,000 units subcu q.2 weeks for hemoglobin less than 11. 2. Folic acid 1 mg p.o. daily.  INTERIM HISTORY:  Ms. Bungert comes in for followup.  She is feeling better.  She is going up to [place], [place], on I think July 11.  Her birthday is [birth year].  She will be gone for about a month.  She is having no problems with pain.  She has had no problems with bowels or bladder.  She has had no headache.  We are improving her hemoglobin slowly but surely with the Procrit.  She is not sure how her kidney functions are.  When we last saw her, her ferritin was 1853.  She has had no fever.  She has had no cough or shortness of breath. There has been no nausea or vomiting.  She still has this abdominal pain.  She may have some gastroparesis from her diabetes.  We will see about ordering an upper GI with small bowel follow-through.  PHYSICAL EXAMINATION:  General:  This is a well-developed, well- nourished African American female who certainly looks younger than 71 years old.  Vital signs:  Temperature of 97.6, pulse 69, respiratory rate 20, blood pressure 125/77.  Weight is 186.  Head and neck: Normocephalic, atraumatic skull.  There are no ocular or oral lesions. There are no palpable cervical or supraclavicular lymph nodes.  Lungs: Clear bilaterally.  Cardiac:  Regular rate and rhythm with normal S1 and S2.  There are no murmurs, rubs or bruits.  Abdomen:  Soft with good bowel sounds.  There is no palpable abdominal mass.  There is no palpable hepatosplenomegaly.  Back:  No tenderness over the spine, ribs or hips.  Extremities:  Show no clubbing, cyanosis or edema.  LABORATORY STUDIES:  White cell count 5.3, hemoglobin 10.1, hematocrit 29.7, platelet count 101.  BUN is 30, creatinine 1.63.   Glucose is 165.  IMPRESSION:  Ms. Ploch is a 71 year old African American female with hemoglobin [place] disease.  She is doing better.  She is improving with the Procrit.  We will make sure she gets a dose of Procrit before she goes out to [place], [place].  We will have her come in on July 10 for lab and probable Procrit.  I did give her a prescription to take out to [place], [place], with her to have her blood checked and Procrit given up there.  She has done this before.  We will see what the upper GI with small bowel follow-through shows.  Again, she does have diabetes and may have some degree of diabetic intestinal issues.  She is actually going to go out to [facility] for a CPAP machine.  I guess she has sleep apnea.  I will plan to see her back myself in 6 weeks' time.    ______________________________ Josph Macho, M.D. PRE/MEDQ  D:  03/15/2012  T:  03/15/2012  Job:  [id number]

## 2012-03-18 LAB — RETICULOCYTES (CHCC)
RBC.: 4.14 MIL/uL (ref 3.87–5.11)
Retic Ct Pct: 2.5 % — ABNORMAL HIGH (ref 0.4–2.3)

## 2012-03-18 LAB — COMPREHENSIVE METABOLIC PANEL
ALT: <8 U/L (ref 0–35)
Albumin: 4.6 g/dL (ref 3.5–5.2)
CO2: 21 mEq/L (ref 19–32)
Calcium: 8.9 mg/dL (ref 8.4–10.5)
Chloride: 110 mEq/L (ref 96–112)
Glucose, Bld: 165 mg/dL — ABNORMAL HIGH (ref 70–99)
Potassium: 4.4 mEq/L (ref 3.5–5.3)
Sodium: 141 mEq/L (ref 135–145)
Total Protein: 6.8 g/dL (ref 6.0–8.3)

## 2012-03-18 LAB — HEMOGLOBINOPATHY EVALUATION
Hemoglobin Other: 42.8 % — ABNORMAL HIGH
Hgb A2 Quant: 0 % — ABNORMAL LOW (ref 2.2–3.2)
Hgb A: 0 % — ABNORMAL LOW (ref 96.8–97.8)

## 2012-03-18 LAB — IRON AND TIBC
TIBC: 254 ug/dL (ref 250–470)
UIBC: 188 ug/dL (ref 125–400)

## 2012-03-22 ENCOUNTER — Ambulatory Visit (HOSPITAL_COMMUNITY)
Admission: RE | Admit: 2012-03-22 | Discharge: 2012-03-22 | Disposition: A | Discharge status: Home / Self Care | Payer: Medicare Other | Source: Ambulatory Visit | Attending: Hematology & Oncology | Admitting: Hematology & Oncology

## 2012-03-22 DIAGNOSIS — K219 Gastro-esophageal reflux disease without esophagitis: Secondary | ICD-10-CM | POA: Insufficient documentation

## 2012-03-22 DIAGNOSIS — K449 Diaphragmatic hernia without obstruction or gangrene: Secondary | ICD-10-CM | POA: Insufficient documentation

## 2012-03-22 DIAGNOSIS — K911 Postgastric surgery syndromes: Secondary | ICD-10-CM | POA: Insufficient documentation

## 2012-03-22 DIAGNOSIS — E119 Type 2 diabetes mellitus without complications: Secondary | ICD-10-CM

## 2012-03-22 DIAGNOSIS — K3184 Gastroparesis: Secondary | ICD-10-CM | POA: Insufficient documentation

## 2012-03-22 DIAGNOSIS — R161 Splenomegaly, not elsewhere classified: Secondary | ICD-10-CM | POA: Insufficient documentation

## 2012-03-26 DIAGNOSIS — G4733 Obstructive sleep apnea (adult) (pediatric): Secondary | ICD-10-CM | POA: Insufficient documentation

## 2012-03-26 DIAGNOSIS — J45909 Unspecified asthma, uncomplicated: Secondary | ICD-10-CM | POA: Insufficient documentation

## 2012-03-27 ENCOUNTER — Other Ambulatory Visit (HOSPITAL_BASED_OUTPATIENT_CLINIC_OR_DEPARTMENT_OTHER): Payer: Medicare Other | Admitting: Lab

## 2012-03-27 ENCOUNTER — Ambulatory Visit (HOSPITAL_BASED_OUTPATIENT_CLINIC_OR_DEPARTMENT_OTHER): Payer: Medicare Other

## 2012-03-27 VITALS — BP 159/91 | HR 75 | Temp 97.0°F

## 2012-03-27 DIAGNOSIS — D649 Anemia, unspecified: Secondary | ICD-10-CM

## 2012-03-27 DIAGNOSIS — D631 Anemia in chronic kidney disease: Secondary | ICD-10-CM

## 2012-03-27 LAB — CBC WITH DIFFERENTIAL (CANCER CENTER ONLY)
BASO#: 0.1 10*3/uL (ref 0.0–0.2)
HCT: 31 % — ABNORMAL LOW (ref 34.8–46.6)
HGB: 10.8 g/dL — ABNORMAL LOW (ref 11.6–15.9)
LYMPH#: 2.4 10*3/uL (ref 0.9–3.3)
LYMPH%: 37 % (ref 14.0–48.0)
MCHC: 34.8 g/dL (ref 32.0–36.0)
MCV: 71 fL — ABNORMAL LOW (ref 81–101)
MONO#: 0.5 10*3/uL (ref 0.1–0.9)
NEUT%: 53.2 % (ref 39.6–80.0)
RDW: 21.5 % — ABNORMAL HIGH (ref 11.1–15.7)

## 2012-03-27 MED ORDER — EPOETIN ALFA 40000 UNIT/ML IJ SOLN
40000.0000 [IU] | Freq: Once | INTRAMUSCULAR | Status: AC
  Administered 2012-03-27: 40000 [IU] via SUBCUTANEOUS

## 2012-03-27 NOTE — Patient Instructions (Signed)
Epoetin Alfa injection What is this medicine? EPOETIN ALFA (e POE e tin AL fa) helps your body make more red blood cells. This medicine is used to treat anemia caused by chronic kidney failure, cancer chemotherapy, or HIV-therapy. It may also be used before surgery if you have anemia. This medicine may be used for other purposes; ask your health care provider or pharmacist if you have questions. What should I tell my health care provider before I take this medicine? They need to know if you have any of these conditions: -blood clotting disorders -cancer patient not on chemotherapy -cystic fibrosis -heart disease, such as angina or heart failure -hemoglobin level of 12 g/dL or greater -high blood pressure -low levels of folate, iron, or vitamin B12 -seizures -an unusual or allergic reaction to erythropoietin, albumin, benzyl alcohol, hamster proteins, other medicines, foods, dyes, or preservatives -pregnant or trying to get pregnant -breast-feeding How should I use this medicine? This medicine is for injection into a vein or under the skin. It is usually given by a health care professional in a hospital or clinic setting. If you get this medicine at home, you will be taught how to prepare and give this medicine. Use exactly as directed. Take your medicine at regular intervals. Do not take your medicine more often than directed. It is important that you put your used needles and syringes in a special sharps container. Do not put them in a trash can. If you do not have a sharps container, call your pharmacist or healthcare provider to get one. Talk to your pediatrician regarding the use of this medicine in children. While this drug may be prescribed for selected conditions, precautions do apply. Overdosage: If you think you have taken too much of this medicine contact a poison control center or emergency room at once. NOTE: This medicine is only for you. Do not share this medicine with  others. What if I miss a dose? If you miss a dose, take it as soon as you can. If it is almost time for your next dose, take only that dose. Do not take double or extra doses. What may interact with this medicine? Do not take this medicine with any of the following medications: -darbepoetin alfa This list may not describe all possible interactions. Give your health care provider a list of all the medicines, herbs, non-prescription drugs, or dietary supplements you use. Also tell them if you smoke, drink alcohol, or use illegal drugs. Some items may interact with your medicine. What should I watch for while using this medicine? Visit your prescriber or health care professional for regular checks on your progress and for the needed blood tests and blood pressure measurements. It is especially important for the doctor to make sure your hemoglobin level is in the desired range, to limit the risk of potential side effects and to give you the best benefit. Keep all appointments for any recommended tests. Check your blood pressure as directed. Ask your doctor what your blood pressure should be and when you should contact him or her. As your body makes more red blood cells, you may need to take iron, folic acid, or vitamin B supplements. Ask your doctor or health care provider which products are right for you. If you have kidney disease continue dietary restrictions, even though this medication can make you feel better. Talk with your doctor or health care professional about the foods you eat and the vitamins that you take. What side effects may I notice   from receiving this medicine? Side effects that you should report to your doctor or health care professional as soon as possible: -allergic reactions like skin rash, itching or hives, swelling of the face, lips, or tongue -breathing problems -changes in vision -chest pain -confusion, trouble speaking or understanding -feeling faint or lightheaded,  falls -high blood pressure -muscle aches or pains -pain, swelling, warmth in the leg -rapid weight gain -severe headaches -sudden numbness or weakness of the face, arm or leg -trouble walking, dizziness, loss of balance or coordination -seizures (convulsions) -swelling of the ankles, feet, hands -unusually weak or tired Side effects that usually do not require medical attention (report to your doctor or health care professional if they continue or are bothersome): -diarrhea -fever, chills (flu-like symptoms) -headaches -nausea, vomiting -redness, stinging, or swelling at site where injected This list may not describe all possible side effects. Call your doctor for medical advice about side effects. You may report side effects to FDA at 1-800-FDA-1088. Where should I keep my medicine? Keep out of the reach of children. Store in a refrigerator between 2 and 8 degrees C (36 and 46 degrees F). Do not freeze or shake. Throw away any unused portion if using a single-dose vial. Multi-dose vials can be kept in the refrigerator for up to 21 days after the initial dose. Throw away unused medicine. NOTE: This sheet is a summary. It may not cover all possible information. If you have questions about this medicine, talk to your doctor, pharmacist, or health care provider.  2012, [employer]. (08/18/2008 10:25:44 AM) 

## 2012-03-27 NOTE — Progress Notes (Signed)
Upper GI results seen by Dr Myna Hidalgo, discussed report with patient and copy given. Rachel Palmer, Grabiela Wohlford [employer]

## 2012-03-28 ENCOUNTER — Other Ambulatory Visit: Payer: Medicare Other | Admitting: Lab

## 2012-03-28 ENCOUNTER — Ambulatory Visit: Payer: Medicare Other

## 2012-03-29 ENCOUNTER — Other Ambulatory Visit: Payer: Medicare Other | Admitting: Lab

## 2012-03-29 ENCOUNTER — Ambulatory Visit: Payer: Medicare Other

## 2012-04-12 ENCOUNTER — Ambulatory Visit: Payer: Medicare Other

## 2012-04-12 ENCOUNTER — Other Ambulatory Visit: Payer: Medicare Other | Admitting: Lab

## 2012-04-26 ENCOUNTER — Ambulatory Visit: Payer: Medicare Other

## 2012-04-26 ENCOUNTER — Ambulatory Visit: Payer: Medicare Other | Admitting: Hematology & Oncology

## 2012-04-26 ENCOUNTER — Other Ambulatory Visit: Payer: Medicare Other | Admitting: Lab

## 2012-05-02 ENCOUNTER — Other Ambulatory Visit (HOSPITAL_BASED_OUTPATIENT_CLINIC_OR_DEPARTMENT_OTHER): Payer: Medicare Other | Admitting: Lab

## 2012-05-02 ENCOUNTER — Ambulatory Visit (HOSPITAL_BASED_OUTPATIENT_CLINIC_OR_DEPARTMENT_OTHER): Payer: Medicare Other | Admitting: Medical

## 2012-05-02 ENCOUNTER — Ambulatory Visit (HOSPITAL_BASED_OUTPATIENT_CLINIC_OR_DEPARTMENT_OTHER): Payer: Medicare Other

## 2012-05-02 VITALS — BP 129/66 | HR 70 | Temp 97.4°F | Resp 20 | Ht 65.0 in | Wt 187.0 lb

## 2012-05-02 DIAGNOSIS — D631 Anemia in chronic kidney disease: Secondary | ICD-10-CM

## 2012-05-02 DIAGNOSIS — N289 Disorder of kidney and ureter, unspecified: Secondary | ICD-10-CM

## 2012-05-02 DIAGNOSIS — N189 Chronic kidney disease, unspecified: Secondary | ICD-10-CM

## 2012-05-02 DIAGNOSIS — E119 Type 2 diabetes mellitus without complications: Secondary | ICD-10-CM

## 2012-05-02 DIAGNOSIS — E041 Nontoxic single thyroid nodule: Secondary | ICD-10-CM

## 2012-05-02 DIAGNOSIS — D649 Anemia, unspecified: Secondary | ICD-10-CM

## 2012-05-02 DIAGNOSIS — D5720 Sickle-cell/Hb-C disease without crisis: Secondary | ICD-10-CM

## 2012-05-02 LAB — CBC WITH DIFFERENTIAL (CANCER CENTER ONLY)
BASO%: 0.5 % (ref 0.0–2.0)
LYMPH%: 29.8 % (ref 14.0–48.0)
MCH: 24.1 pg — ABNORMAL LOW (ref 26.0–34.0)
MCHC: 34.7 g/dL (ref 32.0–36.0)
MCV: 69 fL — ABNORMAL LOW (ref 81–101)
MONO%: 7.5 % (ref 0.0–13.0)
Platelets: 78 10*3/uL — ABNORMAL LOW (ref 145–400)
RDW: 22.5 % — ABNORMAL HIGH (ref 11.1–15.7)

## 2012-05-02 MED ORDER — EPOETIN ALFA 40000 UNIT/ML IJ SOLN
40000.0000 [IU] | INTRAMUSCULAR | Status: DC
  Administered 2012-05-02: 40000 [IU] via SUBCUTANEOUS

## 2012-05-02 NOTE — Progress Notes (Signed)
Diagnoses: #1, hemoglobin [place] disease. #2 thrombocytopenia, chronic. #3, insulin dependent diabetes. #4 anemia, secondary to renal insufficiency.  Current therapy: #1 Procrit 40,000 units subcutaneous every 2 weeks.  For hemoglobin less than 11. #2 folic acid 1 mg by mouth daily.  Interim history: Rachel Palmer presents today for an office followup visit.  Overall, Rachel Palmer, reports, that she's been doing relatively well.  She's currently not having any problems with pain, however, on occasion.  She does have abdominal pain.  She does not have any problems with bowel or bladder issues.  She's not had any headaches or blurry vision.  She reports, with her last Procrit injection on 03/27/2012, she had some right arm and leg swelling.  She reports, that she did eat at a [place] buffet, which is high in its sodium content and that could have added to her swelling.  She denied any lip or throat swelling with this.  She reports, a, swelling, went away over 2-3 days.  She did not have any pain with the swelling.  She did not have any shortness of breath.  Rachel Palmer also reports, that she's been told multiple times from other physicians, that she could have a thyroid condition.  However, there is never been any type of workup.  She reports, that she has had a TSH checked and was told it was on the low side of normal.  She does not report any unintentional weight gain or weight loss.  She denies any T. or cold tolerance.  She does have some fatigue, however, this could be related to her anemia.  We possibly could order an ultrasound of her thyroid to rule out any type of abnormality.  She continues on folic acid 1 mg by mouth daily.  She still continues to travel to [place], [place] on a regular basis to see her sister.  Of note, Rachel Palmer did have an upper GI with small bowel follow-through.  Back on 03/22/2012.  The results revealed mild gastroesophageal reflux of the distal third of the esophagus.  Small hiatal  hernia.  Rapid small bowel transit time, potentially, associated with dumping syndrome.  No evidence of gastroparesis.  Normal appearance.  The small bowel and ileocecal valve.  Splenomegaly was also noted, but there are no measurements.  Review of Systems:Significant for intermittent diarrhea/abdominal pain, otherwise: Pt. Denies any changes in their vision, hearing, adenopathy, fevers, chills, nausea, vomiting, constipation, chest pain, shortness of breath, passing blood, passing out, blacking out,  any changes in skin, joints, neurologic or psychiatric except as noted.  Physical Exam: This is a pleasant, well-developed, well-nourished, 71 year old, African American, female, in no obvious distress Vitals: Temperature 97.4 degrees.  Pulse 70, respirations 20, blood pressure 129/66, and weight is 187 pounds HEENT reveals a normocephalic, atraumatic skull, no scleral icterus, no oral lesions  Neck is supple without any cervical or supraclavicular adenopathy. +small possible thyroid nodule on right side Lungs are clear to auscultation bilaterally. There are no wheezes, rales or rhonci Cardiac is regular rate and rhythm with a normal S1 and S2. There are no murmurs, rubs, or bruits.  Abdomen is soft with good bowel sounds, there is no palpable mass. There is no palpable hepatosplenomegaly. There is no palpable fluid wave.  Musculoskeletal no tenderness of the spine, ribs, or hips.  Extremities there are no clubbing, cyanosis, or edema.  Skin no petechia, purpura or ecchymosis Neurologic is nonfocal.  Laboratory Data:  White count 5.7, hemoglobin 9.1, hematocrit 26.2, MCV 69, platelets  78,000   Current Outpatient Prescriptions on File Prior to Visit  Medication Sig Dispense Refill  . albuterol (PROVENTIL HFA;VENTOLIN HFA) 108 (90 BASE) MCG/ACT inhaler Inhale 2 puffs into the lungs every 6 (six) hours as needed.        . cetirizine (ZYRTEC) 10 MG tablet Take 10 mg by mouth daily.        . fentaNYL  (DURAGESIC - DOSED MCG/HR) 25 MCG/HR Place 1 patch onto the skin every 3 (three) days.      . fish oil-omega-3 fatty acids 1000 MG capsule Take 2 g by mouth daily.      . Fluticasone-Salmeterol (ADVAIR HFA IN) Inhale into the lungs 2 (two) times daily.       Marland Kitchen FOLIC ACID PO Take 1 mg by mouth daily.       Marland Kitchen GLIPIZIDE PO Take 10 mg by mouth daily.       . hydrochlorothiazide (MICROZIDE) 12.5 MG capsule Take 12.5 mg by mouth daily.      . Metoprolol Succinate (TOPROL XL PO) Take 100 mg by mouth daily.       . montelukast (SINGULAIR) 10 MG tablet Take 10 mg by mouth every morning.      Marland Kitchen omeprazole (PRILOSEC) 10 MG capsule Take 10 mg by mouth daily.      Marland Kitchen RAMIPRIL PO Take 10 mg by mouth 2 (two) times daily.       . SERTRALINE HCL PO Take 100 mg by mouth daily. ZOLOFT      . oxycodone (OXY-IR) 5 MG capsule Take 5 mg by mouth every 4 (four) hours as needed.       Assessment/Plan: This is a pleasant, 71 year old, female, with the following issues: #1, hemoglobin [place] disease-for the most part.  Her hemoglobin has improved with the Procrit.  I did notice a decrease.  This time around.  Since her Procrit injection 2 weeks ago.  We will continue to monitor this.  She will continue to come in every 2 weeks for CBC and possible Procrit injection.  #2 possible thyroid nodule-we will go ahead and get her set up for ultrasound of her thyroid to rule out any abnormality.  #3 followup we will plan on seeing Rachel Palmer back in 6 weeks' time, but before then should there be questions or concerns.

## 2012-05-03 ENCOUNTER — Ambulatory Visit (HOSPITAL_BASED_OUTPATIENT_CLINIC_OR_DEPARTMENT_OTHER)
Admission: RE | Admit: 2012-05-03 | Discharge: 2012-05-03 | Disposition: A | Discharge status: Home / Self Care | Payer: Medicare Other | Source: Ambulatory Visit | Attending: Medical | Admitting: Medical

## 2012-05-03 ENCOUNTER — Other Ambulatory Visit: Payer: Self-pay | Admitting: Medical

## 2012-05-03 DIAGNOSIS — E041 Nontoxic single thyroid nodule: Secondary | ICD-10-CM

## 2012-05-03 DIAGNOSIS — E042 Nontoxic multinodular goiter: Secondary | ICD-10-CM | POA: Insufficient documentation

## 2012-05-03 NOTE — Progress Notes (Signed)
I received results from Ms. Arcilla's thyroid ultrasound, which revealed: Bilateral thyroid nodules, measuring up to 14 mm in maximum dimension.  No indications on the current study to suggest immediate tissue sampling, but surveillance is recommended.  If these nodules are borderline for meeting size criteria to recommend biopsy.  The right thyroid lobe measures 3.8 x 0.9 x 1.5 cm.  The left lobe measures 3.3x0.8x1.5 cm.  There are several discrete thyroid nodules seen bilaterally.  Dominant nodule in the right lobe is towards the lower pole and measures 14 x 7 x 8 mm.  This nodule is hypoechoic without microcalcification or halo.  Dominant left nodule was 14 x 7 x 10 mm.  This is mixed cystic and solid nodule without microcalcification or halo.  Other bilateral nodules are tiny, ranging in size from 6 mm to 9 mm.  I informed.  Ms. Robb of her ultrasound results and told her I would go ahead and route these results over to Dr. Fredia Beets at [place], [phone], fax-[phone]

## 2012-05-07 LAB — HEMOGLOBINOPATHY EVALUATION
Hemoglobin Other: 43.3 % — ABNORMAL HIGH
Hgb A: 0 % — ABNORMAL LOW (ref 96.8–97.8)
Hgb F Quant: 2.7 % — ABNORMAL HIGH (ref 0.0–2.0)
Hgb S Quant: 49.9 % — ABNORMAL HIGH

## 2012-05-07 LAB — COMPREHENSIVE METABOLIC PANEL
ALT: <8 U/L (ref 0–35)
Alkaline Phosphatase: 100 U/L (ref 39–117)
Creatinine, Ser: 1.78 mg/dL — ABNORMAL HIGH (ref 0.50–1.10)
Glucose, Bld: 186 mg/dL — ABNORMAL HIGH (ref 70–99)
Sodium: 141 mEq/L (ref 135–145)
Total Bilirubin: 0.9 mg/dL (ref 0.3–1.2)
Total Protein: 6.7 g/dL (ref 6.0–8.3)

## 2012-05-07 LAB — TSH: TSH: 2.6 u[IU]/mL (ref 0.350–4.500)

## 2012-05-07 LAB — T4, FREE: Free T4: 1 ng/dL (ref 0.80–1.80)

## 2012-05-07 LAB — RETICULOCYTES (CHCC): ABS Retic: 140 10*3/uL (ref 19.0–186.0)

## 2012-05-16 ENCOUNTER — Ambulatory Visit (HOSPITAL_BASED_OUTPATIENT_CLINIC_OR_DEPARTMENT_OTHER): Payer: Medicare Other

## 2012-05-16 ENCOUNTER — Other Ambulatory Visit (HOSPITAL_BASED_OUTPATIENT_CLINIC_OR_DEPARTMENT_OTHER): Payer: Medicare Other | Admitting: Lab

## 2012-05-16 DIAGNOSIS — N289 Disorder of kidney and ureter, unspecified: Secondary | ICD-10-CM

## 2012-05-16 DIAGNOSIS — D649 Anemia, unspecified: Secondary | ICD-10-CM

## 2012-05-16 DIAGNOSIS — D631 Anemia in chronic kidney disease: Secondary | ICD-10-CM

## 2012-05-16 LAB — CBC WITH DIFFERENTIAL (CANCER CENTER ONLY)
BASO%: 0.9 % (ref 0.0–2.0)
EOS%: 1.5 % (ref 0.0–7.0)
Eosinophils Absolute: 0.1 10*3/uL (ref 0.0–0.5)
LYMPH%: 39.6 % (ref 14.0–48.0)
MCH: 23.7 pg — ABNORMAL LOW (ref 26.0–34.0)
MCV: 69 fL — ABNORMAL LOW (ref 81–101)
MONO%: 7 % (ref 0.0–13.0)
NEUT#: 3.4 10*3/uL (ref 1.5–6.5)
Platelets: 86 10*3/uL — ABNORMAL LOW (ref 145–400)
RBC: 4.35 10*6/uL (ref 3.70–5.32)
RDW: 22.2 % — ABNORMAL HIGH (ref 11.1–15.7)

## 2012-05-16 MED ORDER — EPOETIN ALFA 40000 UNIT/ML IJ SOLN
40000.0000 [IU] | INTRAMUSCULAR | Status: DC
  Administered 2012-05-16: 40000 [IU] via SUBCUTANEOUS

## 2012-05-30 ENCOUNTER — Ambulatory Visit (HOSPITAL_BASED_OUTPATIENT_CLINIC_OR_DEPARTMENT_OTHER): Payer: Medicare Other

## 2012-05-30 ENCOUNTER — Other Ambulatory Visit (HOSPITAL_BASED_OUTPATIENT_CLINIC_OR_DEPARTMENT_OTHER): Payer: Medicare Other | Admitting: Lab

## 2012-05-30 VITALS — BP 105/64 | HR 68 | Temp 96.9°F

## 2012-05-30 DIAGNOSIS — N189 Chronic kidney disease, unspecified: Secondary | ICD-10-CM

## 2012-05-30 DIAGNOSIS — D649 Anemia, unspecified: Secondary | ICD-10-CM

## 2012-05-30 LAB — CBC WITH DIFFERENTIAL (CANCER CENTER ONLY)
BASO%: 0.9 % (ref 0.0–2.0)
Eosinophils Absolute: 0.2 10*3/uL (ref 0.0–0.5)
LYMPH#: 2.5 10*3/uL (ref 0.9–3.3)
LYMPH%: 39.2 % (ref 14.0–48.0)
MCV: 67 fL — ABNORMAL LOW (ref 81–101)
MONO#: 0.5 10*3/uL (ref 0.1–0.9)
NEUT#: 3.1 10*3/uL (ref 1.5–6.5)
Platelets: 83 10*3/uL — ABNORMAL LOW (ref 145–400)
RBC: 4.14 10*6/uL (ref 3.70–5.32)
RDW: 21.7 % — ABNORMAL HIGH (ref 11.1–15.7)
WBC: 6.4 10*3/uL (ref 3.9–10.0)

## 2012-05-30 MED ORDER — EPOETIN ALFA 40000 UNIT/ML IJ SOLN
40000.0000 [IU] | INTRAMUSCULAR | Status: DC
  Administered 2012-05-30: 40000 [IU] via SUBCUTANEOUS

## 2012-06-06 ENCOUNTER — Ambulatory Visit: Payer: Medicare Other

## 2012-06-06 ENCOUNTER — Other Ambulatory Visit: Payer: Medicare Other | Admitting: Lab

## 2012-06-06 ENCOUNTER — Ambulatory Visit: Payer: Medicare Other | Admitting: Hematology & Oncology

## 2012-06-11 ENCOUNTER — Telehealth: Payer: Self-pay | Admitting: Hematology & Oncology

## 2012-06-11 NOTE — Telephone Encounter (Signed)
Patient called and cx 06/13/12 appointment due to being out of town.  She stated she will call back to resch when she gets back in town.

## 2012-06-13 ENCOUNTER — Ambulatory Visit: Payer: Medicare Other | Admitting: Hematology & Oncology

## 2012-06-13 ENCOUNTER — Ambulatory Visit: Payer: Medicare Other

## 2012-06-13 ENCOUNTER — Other Ambulatory Visit: Payer: Medicare Other | Admitting: Lab

## 2012-06-27 ENCOUNTER — Telehealth: Payer: Self-pay | Admitting: Hematology & Oncology

## 2012-06-27 NOTE — Telephone Encounter (Signed)
Pt called made 11-4 appointment offered her 10-30 but she didn't like the time.

## 2012-07-22 ENCOUNTER — Other Ambulatory Visit (HOSPITAL_BASED_OUTPATIENT_CLINIC_OR_DEPARTMENT_OTHER): Payer: Medicare Other | Admitting: Lab

## 2012-07-22 ENCOUNTER — Ambulatory Visit (HOSPITAL_BASED_OUTPATIENT_CLINIC_OR_DEPARTMENT_OTHER): Payer: Medicare Other

## 2012-07-22 ENCOUNTER — Ambulatory Visit (HOSPITAL_BASED_OUTPATIENT_CLINIC_OR_DEPARTMENT_OTHER): Payer: Medicare Other | Admitting: Hematology & Oncology

## 2012-07-22 VITALS — BP 128/52 | HR 90 | Temp 97.6°F | Resp 18 | Ht 65.0 in | Wt 187.0 lb

## 2012-07-22 DIAGNOSIS — D649 Anemia, unspecified: Secondary | ICD-10-CM

## 2012-07-22 DIAGNOSIS — N289 Disorder of kidney and ureter, unspecified: Secondary | ICD-10-CM

## 2012-07-22 DIAGNOSIS — N189 Chronic kidney disease, unspecified: Secondary | ICD-10-CM

## 2012-07-22 DIAGNOSIS — D631 Anemia in chronic kidney disease: Secondary | ICD-10-CM

## 2012-07-22 DIAGNOSIS — D5720 Sickle-cell/Hb-C disease without crisis: Secondary | ICD-10-CM

## 2012-07-22 LAB — CBC WITH DIFFERENTIAL (CANCER CENTER ONLY)
BASO%: 0.7 % (ref 0.0–2.0)
Eosinophils Absolute: 0.2 10*3/uL (ref 0.0–0.5)
HCT: 20.8 % — ABNORMAL LOW (ref 34.8–46.6)
HGB: 6.9 g/dL — CL (ref 11.6–15.9)
LYMPH#: 3.3 10*3/uL (ref 0.9–3.3)
LYMPH%: 35.9 % (ref 14.0–48.0)
MCV: 72 fL — ABNORMAL LOW (ref 81–101)
MONO#: 0.6 10*3/uL (ref 0.1–0.9)
NEUT%: 54.8 % (ref 39.6–80.0)
RBC: 2.89 10*6/uL — ABNORMAL LOW (ref 3.70–5.32)
RDW: 23.5 % — ABNORMAL HIGH (ref 11.1–15.7)
WBC: 9.1 10*3/uL (ref 3.9–10.0)

## 2012-07-22 MED ORDER — EPOETIN ALFA 40000 UNIT/ML IJ SOLN
40000.0000 [IU] | INTRAMUSCULAR | Status: DC
  Administered 2012-07-22: 40000 [IU] via SUBCUTANEOUS

## 2012-07-22 NOTE — Progress Notes (Signed)
This office note has been dictated.

## 2012-07-23 ENCOUNTER — Telehealth: Payer: Self-pay | Admitting: Hematology & Oncology

## 2012-07-23 NOTE — Telephone Encounter (Signed)
Left message on pt home, cell and husbands phone to call this office for an appointment today

## 2012-07-23 NOTE — Telephone Encounter (Signed)
Pt aware of weekly appointments through 08-20-12

## 2012-07-24 LAB — HEMOGLOBINOPATHY EVALUATION
Hemoglobin Other: 43.3 % — ABNORMAL HIGH
Hgb A: 0 % — ABNORMAL LOW (ref 96.8–97.8)
Hgb S Quant: 50.3 % — ABNORMAL HIGH

## 2012-07-24 LAB — COMPREHENSIVE METABOLIC PANEL
ALT: 9 U/L (ref 0–35)
AST: 14 U/L (ref 0–37)
Albumin: 4.7 g/dL (ref 3.5–5.2)
BUN: 31 mg/dL — ABNORMAL HIGH (ref 6–23)
CO2: 22 mEq/L (ref 19–32)
Calcium: 9.1 mg/dL (ref 8.4–10.5)
Chloride: 111 mEq/L (ref 96–112)
Potassium: 4 mEq/L (ref 3.5–5.3)

## 2012-07-24 LAB — RETICULOCYTES (CHCC)
ABS Retic: 273.9 10*3/uL — ABNORMAL HIGH (ref 19.0–186.0)
RBC.: 3.01 MIL/uL — ABNORMAL LOW (ref 3.87–5.11)

## 2012-07-24 LAB — FERRITIN: Ferritin: 857 ng/mL — ABNORMAL HIGH (ref 10–291)

## 2012-07-25 DIAGNOSIS — R161 Splenomegaly, not elsewhere classified: Secondary | ICD-10-CM | POA: Insufficient documentation

## 2012-07-25 DIAGNOSIS — E042 Nontoxic multinodular goiter: Secondary | ICD-10-CM | POA: Insufficient documentation

## 2012-07-25 DIAGNOSIS — D696 Thrombocytopenia, unspecified: Secondary | ICD-10-CM | POA: Insufficient documentation

## 2012-07-26 DIAGNOSIS — E559 Vitamin D deficiency, unspecified: Secondary | ICD-10-CM | POA: Insufficient documentation

## 2012-07-26 DIAGNOSIS — IMO0002 Reserved for concepts with insufficient information to code with codable children: Secondary | ICD-10-CM | POA: Insufficient documentation

## 2012-07-30 ENCOUNTER — Other Ambulatory Visit: Payer: Self-pay | Admitting: Medical

## 2012-07-30 ENCOUNTER — Ambulatory Visit (HOSPITAL_BASED_OUTPATIENT_CLINIC_OR_DEPARTMENT_OTHER): Payer: Medicare Other

## 2012-07-30 ENCOUNTER — Other Ambulatory Visit (HOSPITAL_BASED_OUTPATIENT_CLINIC_OR_DEPARTMENT_OTHER): Payer: Medicare Other | Admitting: Lab

## 2012-07-30 VITALS — BP 138/73 | HR 66 | Temp 97.0°F | Resp 18

## 2012-07-30 DIAGNOSIS — N189 Chronic kidney disease, unspecified: Secondary | ICD-10-CM

## 2012-07-30 DIAGNOSIS — N039 Chronic nephritic syndrome with unspecified morphologic changes: Secondary | ICD-10-CM

## 2012-07-30 DIAGNOSIS — D631 Anemia in chronic kidney disease: Secondary | ICD-10-CM

## 2012-07-30 LAB — CBC WITH DIFFERENTIAL (CANCER CENTER ONLY)
BASO#: 0.1 10*3/uL (ref 0.0–0.2)
EOS%: 2.6 % (ref 0.0–7.0)
HGB: 9.1 g/dL — ABNORMAL LOW (ref 11.6–15.9)
LYMPH%: 34.9 % (ref 14.0–48.0)
MCH: 24.5 pg — ABNORMAL LOW (ref 26.0–34.0)
MCHC: 34 g/dL (ref 32.0–36.0)
MCV: 72 fL — ABNORMAL LOW (ref 81–101)
MONO%: 7.1 % (ref 0.0–13.0)
NEUT#: 3.3 10*3/uL (ref 1.5–6.5)

## 2012-07-30 MED ORDER — EPOETIN ALFA 40000 UNIT/ML IJ SOLN
40000.0000 [IU] | Freq: Once | INTRAMUSCULAR | Status: AC
  Administered 2012-07-30: 40000 [IU] via SUBCUTANEOUS

## 2012-07-30 NOTE — Patient Instructions (Signed)
Epoetin Alfa injection What is this medicine? EPOETIN ALFA (e POE e tin AL fa) helps your body make more red blood cells. This medicine is used to treat anemia caused by chronic kidney failure, cancer chemotherapy, or HIV-therapy. It may also be used before surgery if you have anemia. This medicine may be used for other purposes; ask your health care provider or pharmacist if you have questions. What should I tell my health care provider before I take this medicine? They need to know if you have any of these conditions: -blood clotting disorders -cancer patient not on chemotherapy -cystic fibrosis -heart disease, such as angina or heart failure -hemoglobin level of 12 g/dL or greater -high blood pressure -low levels of folate, iron, or vitamin B12 -seizures -an unusual or allergic reaction to erythropoietin, albumin, benzyl alcohol, hamster proteins, other medicines, foods, dyes, or preservatives -pregnant or trying to get pregnant -breast-feeding How should I use this medicine? This medicine is for injection into a vein or under the skin. It is usually given by a health care professional in a hospital or clinic setting. If you get this medicine at home, you will be taught how to prepare and give this medicine. Use exactly as directed. Take your medicine at regular intervals. Do not take your medicine more often than directed. It is important that you put your used needles and syringes in a special sharps container. Do not put them in a trash can. If you do not have a sharps container, call your pharmacist or healthcare provider to get one. Talk to your pediatrician regarding the use of this medicine in children. While this drug may be prescribed for selected conditions, precautions do apply. Overdosage: If you think you have taken too much of this medicine contact a poison control center or emergency room at once. NOTE: This medicine is only for you. Do not share this medicine with  others. What if I miss a dose? If you miss a dose, take it as soon as you can. If it is almost time for your next dose, take only that dose. Do not take double or extra doses. What may interact with this medicine? Do not take this medicine with any of the following medications: -darbepoetin alfa This list may not describe all possible interactions. Give your health care provider a list of all the medicines, herbs, non-prescription drugs, or dietary supplements you use. Also tell them if you smoke, drink alcohol, or use illegal drugs. Some items may interact with your medicine. What should I watch for while using this medicine? Visit your prescriber or health care professional for regular checks on your progress and for the needed blood tests and blood pressure measurements. It is especially important for the doctor to make sure your hemoglobin level is in the desired range, to limit the risk of potential side effects and to give you the best benefit. Keep all appointments for any recommended tests. Check your blood pressure as directed. Ask your doctor what your blood pressure should be and when you should contact him or her. As your body makes more red blood cells, you may need to take iron, folic acid, or vitamin B supplements. Ask your doctor or health care provider which products are right for you. If you have kidney disease continue dietary restrictions, even though this medication can make you feel better. Talk with your doctor or health care professional about the foods you eat and the vitamins that you take. What side effects may I notice   from receiving this medicine? Side effects that you should report to your doctor or health care professional as soon as possible: -allergic reactions like skin rash, itching or hives, swelling of the face, lips, or tongue -breathing problems -changes in vision -chest pain -confusion, trouble speaking or understanding -feeling faint or lightheaded,  falls -high blood pressure -muscle aches or pains -pain, swelling, warmth in the leg -rapid weight gain -severe headaches -sudden numbness or weakness of the face, arm or leg -trouble walking, dizziness, loss of balance or coordination -seizures (convulsions) -swelling of the ankles, feet, hands -unusually weak or tired Side effects that usually do not require medical attention (report to your doctor or health care professional if they continue or are bothersome): -diarrhea -fever, chills (flu-like symptoms) -headaches -nausea, vomiting -redness, stinging, or swelling at site where injected This list may not describe all possible side effects. Call your doctor for medical advice about side effects. You may report side effects to FDA at 1-800-FDA-1088. Where should I keep my medicine? Keep out of the reach of children. Store in a refrigerator between 2 and 8 degrees C (36 and 46 degrees F). Do not freeze or shake. Throw away any unused portion if using a single-dose vial. Multi-dose vials can be kept in the refrigerator for up to 21 days after the initial dose. Throw away unused medicine. NOTE: This sheet is a summary. It may not cover all possible information. If you have questions about this medicine, talk to your doctor, pharmacist, or health care provider.  2012, [employer]. (08/18/2008 10:25:44 AM) 

## 2012-07-31 ENCOUNTER — Other Ambulatory Visit: Payer: Self-pay

## 2012-08-06 ENCOUNTER — Ambulatory Visit: Payer: Medicare Other

## 2012-08-06 ENCOUNTER — Other Ambulatory Visit (HOSPITAL_BASED_OUTPATIENT_CLINIC_OR_DEPARTMENT_OTHER): Payer: Medicare Other | Admitting: Lab

## 2012-08-06 DIAGNOSIS — N189 Chronic kidney disease, unspecified: Secondary | ICD-10-CM

## 2012-08-06 DIAGNOSIS — D631 Anemia in chronic kidney disease: Secondary | ICD-10-CM

## 2012-08-06 LAB — CBC WITH DIFFERENTIAL (CANCER CENTER ONLY)
BASO#: 0 10*3/uL (ref 0.0–0.2)
BASO%: 0.6 % (ref 0.0–2.0)
HCT: 33 % — ABNORMAL LOW (ref 34.8–46.6)
HGB: 11.3 g/dL — ABNORMAL LOW (ref 11.6–15.9)
LYMPH#: 2.3 10*3/uL (ref 0.9–3.3)
MONO#: 0.6 10*3/uL (ref 0.1–0.9)
NEUT%: 51.3 % (ref 39.6–80.0)
RDW: 23.8 % — ABNORMAL HIGH (ref 11.1–15.7)
WBC: 6.2 10*3/uL (ref 3.9–10.0)

## 2012-08-13 ENCOUNTER — Other Ambulatory Visit (HOSPITAL_BASED_OUTPATIENT_CLINIC_OR_DEPARTMENT_OTHER): Payer: Medicare Other | Admitting: Lab

## 2012-08-13 ENCOUNTER — Ambulatory Visit: Payer: Medicare Other

## 2012-08-13 VITALS — BP 134/83 | HR 65 | Temp 97.7°F | Resp 20

## 2012-08-13 DIAGNOSIS — D631 Anemia in chronic kidney disease: Secondary | ICD-10-CM

## 2012-08-13 DIAGNOSIS — N189 Chronic kidney disease, unspecified: Secondary | ICD-10-CM

## 2012-08-13 DIAGNOSIS — N039 Chronic nephritic syndrome with unspecified morphologic changes: Secondary | ICD-10-CM

## 2012-08-13 LAB — CBC WITH DIFFERENTIAL (CANCER CENTER ONLY)
BASO#: 0.1 10*3/uL (ref 0.0–0.2)
EOS%: 2.1 % (ref 0.0–7.0)
Eosinophils Absolute: 0.1 10*3/uL (ref 0.0–0.5)
HCT: 33.2 % — ABNORMAL LOW (ref 34.8–46.6)
HGB: 11.5 g/dL — ABNORMAL LOW (ref 11.6–15.9)
LYMPH#: 2.3 10*3/uL (ref 0.9–3.3)
MCHC: 34.6 g/dL (ref 32.0–36.0)
NEUT%: 50.4 % (ref 39.6–80.0)
RBC: 4.82 10*6/uL (ref 3.70–5.32)

## 2012-08-13 MED ORDER — EPOETIN ALFA 40000 UNIT/ML IJ SOLN: 40000.0000 [IU] | Freq: Once | INTRAMUSCULAR | Status: DC

## 2012-08-13 NOTE — Patient Instructions (Signed)
Epoetin Alfa injection What is this medicine? EPOETIN ALFA (e POE e tin AL fa) helps your body make more red blood cells. This medicine is used to treat anemia caused by chronic kidney failure, cancer chemotherapy, or HIV-therapy. It may also be used before surgery if you have anemia. This medicine may be used for other purposes; ask your health care provider or pharmacist if you have questions. What should I tell my health care provider before I take this medicine? They need to know if you have any of these conditions: -blood clotting disorders -cancer patient not on chemotherapy -cystic fibrosis -heart disease, such as angina or heart failure -hemoglobin level of 12 g/dL or greater -high blood pressure -low levels of folate, iron, or vitamin B12 -seizures -an unusual or allergic reaction to erythropoietin, albumin, benzyl alcohol, hamster proteins, other medicines, foods, dyes, or preservatives -pregnant or trying to get pregnant -breast-feeding How should I use this medicine? This medicine is for injection into a vein or under the skin. It is usually given by a health care professional in a hospital or clinic setting. If you get this medicine at home, you will be taught how to prepare and give this medicine. Use exactly as directed. Take your medicine at regular intervals. Do not take your medicine more often than directed. It is important that you put your used needles and syringes in a special sharps container. Do not put them in a trash can. If you do not have a sharps container, call your pharmacist or healthcare provider to get one. Talk to your pediatrician regarding the use of this medicine in children. While this drug may be prescribed for selected conditions, precautions do apply. Overdosage: If you think you have taken too much of this medicine contact a poison control center or emergency room at once. NOTE: This medicine is only for you. Do not share this medicine with  others. What if I miss a dose? If you miss a dose, take it as soon as you can. If it is almost time for your next dose, take only that dose. Do not take double or extra doses. What may interact with this medicine? Do not take this medicine with any of the following medications: -darbepoetin alfa This list may not describe all possible interactions. Give your health care provider a list of all the medicines, herbs, non-prescription drugs, or dietary supplements you use. Also tell them if you smoke, drink alcohol, or use illegal drugs. Some items may interact with your medicine. What should I watch for while using this medicine? Visit your prescriber or health care professional for regular checks on your progress and for the needed blood tests and blood pressure measurements. It is especially important for the doctor to make sure your hemoglobin level is in the desired range, to limit the risk of potential side effects and to give you the best benefit. Keep all appointments for any recommended tests. Check your blood pressure as directed. Ask your doctor what your blood pressure should be and when you should contact him or her. As your body makes more red blood cells, you may need to take iron, folic acid, or vitamin B supplements. Ask your doctor or health care provider which products are right for you. If you have kidney disease continue dietary restrictions, even though this medication can make you feel better. Talk with your doctor or health care professional about the foods you eat and the vitamins that you take. What side effects may I notice   from receiving this medicine? Side effects that you should report to your doctor or health care professional as soon as possible: -allergic reactions like skin rash, itching or hives, swelling of the face, lips, or tongue -breathing problems -changes in vision -chest pain -confusion, trouble speaking or understanding -feeling faint or lightheaded,  falls -high blood pressure -muscle aches or pains -pain, swelling, warmth in the leg -rapid weight gain -severe headaches -sudden numbness or weakness of the face, arm or leg -trouble walking, dizziness, loss of balance or coordination -seizures (convulsions) -swelling of the ankles, feet, hands -unusually weak or tired Side effects that usually do not require medical attention (report to your doctor or health care professional if they continue or are bothersome): -diarrhea -fever, chills (flu-like symptoms) -headaches -nausea, vomiting -redness, stinging, or swelling at site where injected This list may not describe all possible side effects. Call your doctor for medical advice about side effects. You may report side effects to FDA at 1-800-FDA-1088. Where should I keep my medicine? Keep out of the reach of children. Store in a refrigerator between 2 and 8 degrees C (36 and 46 degrees F). Do not freeze or shake. Throw away any unused portion if using a single-dose vial. Multi-dose vials can be kept in the refrigerator for up to 21 days after the initial dose. Throw away unused medicine. NOTE: This sheet is a summary. It may not cover all possible information. If you have questions about this medicine, talk to your doctor, pharmacist, or health care provider.  2012, [employer]. (08/18/2008 10:25:44 AM) 

## 2012-08-20 ENCOUNTER — Ambulatory Visit: Payer: Medicare Other

## 2012-08-20 ENCOUNTER — Ambulatory Visit: Payer: Medicare Other | Admitting: Hematology & Oncology

## 2012-08-20 ENCOUNTER — Other Ambulatory Visit: Payer: Medicare Other | Admitting: Lab

## 2012-08-20 ENCOUNTER — Telehealth: Payer: Self-pay | Admitting: Hematology & Oncology

## 2012-08-20 NOTE — Telephone Encounter (Signed)
Patient called stating she woke up late and asked if she could still come in for her apt.  i spoke with Darl Pikes about the situation.  Darl Pikes checked with MD and said patient must resch.  Patient resch apt for 08/23/12

## 2012-08-23 ENCOUNTER — Ambulatory Visit (HOSPITAL_BASED_OUTPATIENT_CLINIC_OR_DEPARTMENT_OTHER): Payer: Medicare Other | Admitting: Hematology & Oncology

## 2012-08-23 ENCOUNTER — Ambulatory Visit (HOSPITAL_BASED_OUTPATIENT_CLINIC_OR_DEPARTMENT_OTHER): Payer: Medicare Other

## 2012-08-23 ENCOUNTER — Other Ambulatory Visit (HOSPITAL_BASED_OUTPATIENT_CLINIC_OR_DEPARTMENT_OTHER): Payer: Medicare Other | Admitting: Lab

## 2012-08-23 DIAGNOSIS — D5720 Sickle-cell/Hb-C disease without crisis: Secondary | ICD-10-CM

## 2012-08-23 DIAGNOSIS — N189 Chronic kidney disease, unspecified: Secondary | ICD-10-CM

## 2012-08-23 DIAGNOSIS — D649 Anemia, unspecified: Secondary | ICD-10-CM

## 2012-08-23 DIAGNOSIS — E119 Type 2 diabetes mellitus without complications: Secondary | ICD-10-CM

## 2012-08-23 DIAGNOSIS — D696 Thrombocytopenia, unspecified: Secondary | ICD-10-CM

## 2012-08-23 DIAGNOSIS — R161 Splenomegaly, not elsewhere classified: Secondary | ICD-10-CM

## 2012-08-23 DIAGNOSIS — D571 Sickle-cell disease without crisis: Secondary | ICD-10-CM

## 2012-08-23 DIAGNOSIS — D631 Anemia in chronic kidney disease: Secondary | ICD-10-CM

## 2012-08-23 LAB — RETICULOCYTES (CHCC)
ABS Retic: 91.1 10*3/uL (ref 19.0–186.0)
RBC.: 4.34 MIL/uL (ref 3.87–5.11)

## 2012-08-23 LAB — CBC WITH DIFFERENTIAL (CANCER CENTER ONLY)
BASO#: 0.1 10*3/uL (ref 0.0–0.2)
EOS%: 2.6 % (ref 0.0–7.0)
Eosinophils Absolute: 0.2 10*3/uL (ref 0.0–0.5)
HCT: 28.4 % — ABNORMAL LOW (ref 34.8–46.6)
HGB: 9.7 g/dL — ABNORMAL LOW (ref 11.6–15.9)
LYMPH%: 34.3 % (ref 14.0–48.0)
MCH: 23.2 pg — ABNORMAL LOW (ref 26.0–34.0)
MCHC: 34.2 g/dL (ref 32.0–36.0)
MCV: 68 fL — ABNORMAL LOW (ref 81–101)
MONO%: 7.4 % (ref 0.0–13.0)
NEUT#: 3.4 10*3/uL (ref 1.5–6.5)
NEUT%: 54.7 % (ref 39.6–80.0)

## 2012-08-23 LAB — CMP (CANCER CENTER ONLY)
AST: 16 U/L (ref 11–38)
Alkaline Phosphatase: 104 U/L — ABNORMAL HIGH (ref 26–84)
BUN, Bld: 27 mg/dL — ABNORMAL HIGH (ref 7–22)
Creat: 1.5 mg/dl — ABNORMAL HIGH (ref 0.6–1.2)
Glucose, Bld: 179 mg/dL — ABNORMAL HIGH (ref 73–118)

## 2012-08-23 MED ORDER — EPOETIN ALFA 40000 UNIT/ML IJ SOLN
40000.0000 [IU] | Freq: Once | INTRAMUSCULAR | Status: AC
  Administered 2012-08-23: 40000 [IU] via SUBCUTANEOUS

## 2012-08-23 NOTE — Progress Notes (Signed)
This office note has been dictated.

## 2012-08-23 NOTE — Patient Instructions (Addendum)
Epoetin Alfa injection What is this medicine? EPOETIN ALFA (e POE e tin AL fa) helps your body make more red blood cells. This medicine is used to treat anemia caused by chronic kidney failure, cancer chemotherapy, or HIV-therapy. It may also be used before surgery if you have anemia. This medicine may be used for other purposes; ask your health care provider or pharmacist if you have questions. What should I tell my health care provider before I take this medicine? They need to know if you have any of these conditions: -blood clotting disorders -cancer patient not on chemotherapy -cystic fibrosis -heart disease, such as angina or heart failure -hemoglobin level of 12 g/dL or greater -high blood pressure -low levels of folate, iron, or vitamin B12 -seizures -an unusual or allergic reaction to erythropoietin, albumin, benzyl alcohol, hamster proteins, other medicines, foods, dyes, or preservatives -pregnant or trying to get pregnant -breast-feeding How should I use this medicine? This medicine is for injection into a vein or under the skin. It is usually given by a health care professional in a hospital or clinic setting. If you get this medicine at home, you will be taught how to prepare and give this medicine. Use exactly as directed. Take your medicine at regular intervals. Do not take your medicine more often than directed. It is important that you put your used needles and syringes in a special sharps container. Do not put them in a trash can. If you do not have a sharps container, call your pharmacist or healthcare provider to get one. Talk to your pediatrician regarding the use of this medicine in children. While this drug may be prescribed for selected conditions, precautions do apply. Overdosage: If you think you have taken too much of this medicine contact a poison control center or emergency room at once. NOTE: This medicine is only for you. Do not share this medicine with  others. What if I miss a dose? If you miss a dose, take it as soon as you can. If it is almost time for your next dose, take only that dose. Do not take double or extra doses. What may interact with this medicine? Do not take this medicine with any of the following medications: -darbepoetin alfa This list may not describe all possible interactions. Give your health care provider a list of all the medicines, herbs, non-prescription drugs, or dietary supplements you use. Also tell them if you smoke, drink alcohol, or use illegal drugs. Some items may interact with your medicine. What should I watch for while using this medicine? Visit your prescriber or health care professional for regular checks on your progress and for the needed blood tests and blood pressure measurements. It is especially important for the doctor to make sure your hemoglobin level is in the desired range, to limit the risk of potential side effects and to give you the best benefit. Keep all appointments for any recommended tests. Check your blood pressure as directed. Ask your doctor what your blood pressure should be and when you should contact him or her. As your body makes more red blood cells, you may need to take iron, folic acid, or vitamin B supplements. Ask your doctor or health care provider which products are right for you. If you have kidney disease continue dietary restrictions, even though this medication can make you feel better. Talk with your doctor or health care professional about the foods you eat and the vitamins that you take. What side effects may I notice   from receiving this medicine? Side effects that you should report to your doctor or health care professional as soon as possible: -allergic reactions like skin rash, itching or hives, swelling of the face, lips, or tongue -breathing problems -changes in vision -chest pain -confusion, trouble speaking or understanding -feeling faint or lightheaded,  falls -high blood pressure -muscle aches or pains -pain, swelling, warmth in the leg -rapid weight gain -severe headaches -sudden numbness or weakness of the face, arm or leg -trouble walking, dizziness, loss of balance or coordination -seizures (convulsions) -swelling of the ankles, feet, hands -unusually weak or tired Side effects that usually do not require medical attention (report to your doctor or health care professional if they continue or are bothersome): -diarrhea -fever, chills (flu-like symptoms) -headaches -nausea, vomiting -redness, stinging, or swelling at site where injected This list may not describe all possible side effects. Call your doctor for medical advice about side effects. You may report side effects to FDA at 1-800-FDA-1088. Where should I keep my medicine? Keep out of the reach of children. Store in a refrigerator between 2 and 8 degrees C (36 and 46 degrees F). Do not freeze or shake. Throw away any unused portion if using a single-dose vial. Multi-dose vials can be kept in the refrigerator for up to 21 days after the initial dose. Throw away unused medicine. NOTE: This sheet is a summary. It may not cover all possible information. If you have questions about this medicine, talk to your doctor, pharmacist, or health care provider.  2012, [employer]. (08/18/2008 10:25:44 AM) 

## 2012-08-24 NOTE — Progress Notes (Signed)
CC:   Jackie Plum, M.D.  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 2. Chronic thrombocytopenia. 3. Splenomegaly. 4. 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  CURRENT THERAPY: 1. Procrit 40,000 units subcu q.2-3 weeks for hemoglobin less than 11. 2. Folic acid 1 mg p.o. daily.  INTERIM HISTORY:  Rachel Palmer comes in for followup.  She is doing better. We got her on an aggressive regimen of Procrit.  This got her blood count up quite nicely.  Again, I think that a lot of her issues are going to be related to her diabetes.  This really is just not all that well-controlled.  Today, her blood sugar was 179.  We have gotten her blood count up nicely with the Procrit.  When we saw her back in November her hemoglobin was 6.9.  Since then it has come up to 11.5.  Last Procrit I think was back on November 12.  She has had no bleeding.  There has been no abdominal pain.  She has had no real sickle issues.  She goes out [facility] I think next week for the kidney issues.  She does have some splenomegaly.  This is probably what is causing her thrombocytopenia.  The splenomegaly also might be part of her anemia and increasing sickle cell destruction.  PHYSICAL EXAMINATION:  General:  This is a well-developed, well- nourished black female in no obvious distress.  Vital signs: Temperature 98, pulse 63, respiratory rate 18, blood pressure 138/56. Weight is 187.  Head and neck:  Shows a normocephalic, atraumatic skull. There is no scleral icterus.  There is no adenopathy in the neck. Thyroid is nonpalpable.  Lungs:  Clear bilaterally.  Cardiac:  Regular rate and rhythm with a normal S1, S2.  There are no murmurs, rubs or bruits.  Abdomen:  Soft with good bowel sounds.  There is no palpable abdominal mass.  There is no fluid wave.  There is no palpable hepatosplenomegaly.  Extremities:  Show some trace edema in her legs. Neurological:  Shows no focal neurological deficits.  LABORATORY  STUDIES:  White cell count is 6.2, hemoglobin 9.7, hematocrit 28.4, platelet count 87,000.  BUN 27, creatinine 1.5.  Total bilirubin is 0.8.  IMPRESSION:  Rachel Palmer is a 71 year old black female with hemoglobin [place] disease.  She has responded to Procrit nicely.  We will go ahead and give her a dose today.  We will then get her back next week to see if she needs a dose before she goes to [place], [place], for Christmas.  Again, I told her about the diabetes and how this is going to be her biggest problem long-term.  Hopefully, she will get this under better control.  She does have a family doctor but I am not sure if she really sees him.  I told her that it was also important that she continue her folic acid every day.  I will plan to see her back myself in about 4-6 weeks.    ______________________________ Josph Macho, M.D. PRE/MEDQ  D:  08/23/2012  T:  08/24/2012  Job:  [id number]

## 2012-08-30 ENCOUNTER — Ambulatory Visit (HOSPITAL_BASED_OUTPATIENT_CLINIC_OR_DEPARTMENT_OTHER): Payer: Medicare Other

## 2012-08-30 ENCOUNTER — Other Ambulatory Visit (HOSPITAL_BASED_OUTPATIENT_CLINIC_OR_DEPARTMENT_OTHER): Payer: Medicare Other | Admitting: Lab

## 2012-08-30 VITALS — BP 127/72 | HR 74 | Temp 97.0°F | Resp 20

## 2012-08-30 DIAGNOSIS — D631 Anemia in chronic kidney disease: Secondary | ICD-10-CM

## 2012-08-30 DIAGNOSIS — N189 Chronic kidney disease, unspecified: Secondary | ICD-10-CM

## 2012-08-30 LAB — CBC WITH DIFFERENTIAL (CANCER CENTER ONLY)
BASO%: 0.6 % (ref 0.0–2.0)
EOS%: 2.3 % (ref 0.0–7.0)
HCT: 28 % — ABNORMAL LOW (ref 34.8–46.6)
LYMPH#: 2.2 10*3/uL (ref 0.9–3.3)
MCHC: 34.3 g/dL (ref 32.0–36.0)
MONO#: 0.6 10*3/uL (ref 0.1–0.9)
NEUT#: 3.6 10*3/uL (ref 1.5–6.5)
Platelets: 80 10*3/uL — ABNORMAL LOW (ref 145–400)
RDW: 22.4 % — ABNORMAL HIGH (ref 11.1–15.7)
WBC: 6.5 10*3/uL (ref 3.9–10.0)

## 2012-08-30 MED ORDER — EPOETIN ALFA 40000 UNIT/ML IJ SOLN
40000.0000 [IU] | Freq: Once | INTRAMUSCULAR | Status: AC
  Administered 2012-08-30: 40000 [IU] via SUBCUTANEOUS

## 2012-08-30 NOTE — Patient Instructions (Addendum)
Epoetin Alfa injection What is this medicine? EPOETIN ALFA (e POE e tin AL fa) helps your body make more red blood cells. This medicine is used to treat anemia caused by chronic kidney failure, cancer chemotherapy, or HIV-therapy. It may also be used before surgery if you have anemia. This medicine may be used for other purposes; ask your health care provider or pharmacist if you have questions. What should I tell my health care provider before I take this medicine? They need to know if you have any of these conditions: -blood clotting disorders -cancer patient not on chemotherapy -cystic fibrosis -heart disease, such as angina or heart failure -hemoglobin level of 12 g/dL or greater -high blood pressure -low levels of folate, iron, or vitamin B12 -seizures -an unusual or allergic reaction to erythropoietin, albumin, benzyl alcohol, hamster proteins, other medicines, foods, dyes, or preservatives -pregnant or trying to get pregnant -breast-feeding How should I use this medicine? This medicine is for injection into a vein or under the skin. It is usually given by a health care professional in a hospital or clinic setting. If you get this medicine at home, you will be taught how to prepare and give this medicine. Use exactly as directed. Take your medicine at regular intervals. Do not take your medicine more often than directed. It is important that you put your used needles and syringes in a special sharps container. Do not put them in a trash can. If you do not have a sharps container, call your pharmacist or healthcare provider to get one. Talk to your pediatrician regarding the use of this medicine in children. While this drug may be prescribed for selected conditions, precautions do apply. Overdosage: If you think you have taken too much of this medicine contact a poison control center or emergency room at once. NOTE: This medicine is only for you. Do not share this medicine with  others. What if I miss a dose? If you miss a dose, take it as soon as you can. If it is almost time for your next dose, take only that dose. Do not take double or extra doses. What may interact with this medicine? Do not take this medicine with any of the following medications: -darbepoetin alfa This list may not describe all possible interactions. Give your health care provider a list of all the medicines, herbs, non-prescription drugs, or dietary supplements you use. Also tell them if you smoke, drink alcohol, or use illegal drugs. Some items may interact with your medicine. What should I watch for while using this medicine? Visit your prescriber or health care professional for regular checks on your progress and for the needed blood tests and blood pressure measurements. It is especially important for the doctor to make sure your hemoglobin level is in the desired range, to limit the risk of potential side effects and to give you the best benefit. Keep all appointments for any recommended tests. Check your blood pressure as directed. Ask your doctor what your blood pressure should be and when you should contact him or her. As your body makes more red blood cells, you may need to take iron, folic acid, or vitamin B supplements. Ask your doctor or health care provider which products are right for you. If you have kidney disease continue dietary restrictions, even though this medication can make you feel better. Talk with your doctor or health care professional about the foods you eat and the vitamins that you take. What side effects may I notice   from receiving this medicine? Side effects that you should report to your doctor or health care professional as soon as possible: -allergic reactions like skin rash, itching or hives, swelling of the face, lips, or tongue -breathing problems -changes in vision -chest pain -confusion, trouble speaking or understanding -feeling faint or lightheaded,  falls -high blood pressure -muscle aches or pains -pain, swelling, warmth in the leg -rapid weight gain -severe headaches -sudden numbness or weakness of the face, arm or leg -trouble walking, dizziness, loss of balance or coordination -seizures (convulsions) -swelling of the ankles, feet, hands -unusually weak or tired Side effects that usually do not require medical attention (report to your doctor or health care professional if they continue or are bothersome): -diarrhea -fever, chills (flu-like symptoms) -headaches -nausea, vomiting -redness, stinging, or swelling at site where injected This list may not describe all possible side effects. Call your doctor for medical advice about side effects. You may report side effects to FDA at 1-800-FDA-1088. Where should I keep my medicine? Keep out of the reach of children. Store in a refrigerator between 2 and 8 degrees C (36 and 46 degrees F). Do not freeze or shake. Throw away any unused portion if using a single-dose vial. Multi-dose vials can be kept in the refrigerator for up to 21 days after the initial dose. Throw away unused medicine. NOTE: This sheet is a summary. It may not cover all possible information. If you have questions about this medicine, talk to your doctor, pharmacist, or health care provider.  2012, [employer]. (08/18/2008 10:25:44 AM) 

## 2012-09-23 ENCOUNTER — Ambulatory Visit: Payer: Medicare Other

## 2012-09-23 ENCOUNTER — Ambulatory Visit: Payer: Medicare Other | Admitting: Medical

## 2012-09-23 ENCOUNTER — Other Ambulatory Visit: Payer: Medicare Other | Admitting: Lab

## 2012-09-23 NOTE — Progress Notes (Signed)
No injection today per dr. ennever 

## 2012-09-25 ENCOUNTER — Telehealth: Payer: Self-pay | Admitting: Hematology & Oncology

## 2012-09-25 NOTE — Telephone Encounter (Signed)
Left message on cell phone for pt to call and reschedule missed 09-23-12 appointment. I called house phone no answer but mail box was full and it would not let me leave message

## 2012-09-26 ENCOUNTER — Telehealth: Payer: Self-pay | Admitting: Hematology & Oncology

## 2012-09-26 NOTE — Telephone Encounter (Signed)
Pt scheduled 10-07-12 appointment

## 2012-10-07 ENCOUNTER — Ambulatory Visit: Payer: Medicare Other | Admitting: Medical

## 2012-10-07 ENCOUNTER — Ambulatory Visit: Payer: Medicare Other

## 2012-10-07 ENCOUNTER — Other Ambulatory Visit: Payer: Medicare Other | Admitting: Lab

## 2012-10-14 ENCOUNTER — Telehealth: Payer: Self-pay | Admitting: Hematology & Oncology

## 2012-10-14 ENCOUNTER — Other Ambulatory Visit: Payer: Medicare Other | Admitting: Lab

## 2012-10-14 ENCOUNTER — Ambulatory Visit: Payer: Medicare Other

## 2012-10-14 ENCOUNTER — Ambulatory Visit: Payer: Medicare Other | Admitting: Medical

## 2012-10-14 NOTE — Telephone Encounter (Signed)
Pt called said she was going to miss today's appointment that she is still out of town. She will call back when she gets back to reschedule

## 2012-10-22 ENCOUNTER — Telehealth: Payer: Self-pay | Admitting: Hematology & Oncology

## 2012-10-22 NOTE — Telephone Encounter (Signed)
Left voicemail message informing patient that their 10/23/12 appointment has been reschedule to 10/25/12 at 1:45pm due to Eunice Blase being sick this week.  Asked patient to call back and confirm.

## 2012-10-22 NOTE — Telephone Encounter (Signed)
Patient call back from a message that was left on her voice mail about an changed apt.  Patient does not want to keep her apt with [facility].  She cx the resch 10/25/12 apt and resch the apt back for tomorrow 10/23/12 for lab and inj only

## 2012-10-23 ENCOUNTER — Ambulatory Visit (HOSPITAL_BASED_OUTPATIENT_CLINIC_OR_DEPARTMENT_OTHER): Payer: Medicare Other

## 2012-10-23 ENCOUNTER — Other Ambulatory Visit: Payer: Medicare Other | Admitting: Lab

## 2012-10-23 ENCOUNTER — Ambulatory Visit: Payer: Medicare Other | Admitting: Medical

## 2012-10-23 ENCOUNTER — Other Ambulatory Visit (HOSPITAL_BASED_OUTPATIENT_CLINIC_OR_DEPARTMENT_OTHER): Payer: Medicare Other | Admitting: Lab

## 2012-10-23 ENCOUNTER — Ambulatory Visit: Payer: Medicare Other

## 2012-10-23 VITALS — BP 129/76 | HR 77 | Temp 96.5°F | Resp 18

## 2012-10-23 DIAGNOSIS — D631 Anemia in chronic kidney disease: Secondary | ICD-10-CM

## 2012-10-23 DIAGNOSIS — N289 Disorder of kidney and ureter, unspecified: Secondary | ICD-10-CM

## 2012-10-23 DIAGNOSIS — D649 Anemia, unspecified: Secondary | ICD-10-CM

## 2012-10-23 DIAGNOSIS — N039 Chronic nephritic syndrome with unspecified morphologic changes: Secondary | ICD-10-CM

## 2012-10-23 DIAGNOSIS — N189 Chronic kidney disease, unspecified: Secondary | ICD-10-CM

## 2012-10-23 LAB — CBC WITH DIFFERENTIAL (CANCER CENTER ONLY)
BASO%: 0.8 % (ref 0.0–2.0)
EOS%: 1.8 % (ref 0.0–7.0)
LYMPH#: 2.3 10*3/uL (ref 0.9–3.3)
MCHC: 34.3 g/dL (ref 32.0–36.0)
NEUT#: 4.3 10*3/uL (ref 1.5–6.5)
Platelets: 80 10*3/uL — ABNORMAL LOW (ref 145–400)
RDW: 22.9 % — ABNORMAL HIGH (ref 11.1–15.7)

## 2012-10-23 MED ORDER — EPOETIN ALFA 40000 UNIT/ML IJ SOLN
40000.0000 [IU] | Freq: Once | INTRAMUSCULAR | Status: AC
  Administered 2012-10-23: 40000 [IU] via SUBCUTANEOUS

## 2012-10-23 NOTE — Patient Instructions (Signed)
Epoetin Alfa injection What is this medicine? EPOETIN ALFA (e POE e tin AL fa) helps your body make more red blood cells. This medicine is used to treat anemia caused by chronic kidney failure, cancer chemotherapy, or HIV-therapy. It may also be used before surgery if you have anemia. This medicine may be used for other purposes; ask your health care provider or pharmacist if you have questions. What should I tell my health care provider before I take this medicine? They need to know if you have any of these conditions: -blood clotting disorders -cancer patient not on chemotherapy -cystic fibrosis -heart disease, such as angina or heart failure -hemoglobin level of 12 g/dL or greater -high blood pressure -low levels of folate, iron, or vitamin B12 -seizures -an unusual or allergic reaction to erythropoietin, albumin, benzyl alcohol, hamster proteins, other medicines, foods, dyes, or preservatives -pregnant or trying to get pregnant -breast-feeding How should I use this medicine? This medicine is for injection into a vein or under the skin. It is usually given by a health care professional in a hospital or clinic setting. If you get this medicine at home, you will be taught how to prepare and give this medicine. Use exactly as directed. Take your medicine at regular intervals. Do not take your medicine more often than directed. It is important that you put your used needles and syringes in a special sharps container. Do not put them in a trash can. If you do not have a sharps container, call your pharmacist or healthcare provider to get one. Talk to your pediatrician regarding the use of this medicine in children. While this drug may be prescribed for selected conditions, precautions do apply. Overdosage: If you think you have taken too much of this medicine contact a poison control center or emergency room at once. NOTE: This medicine is only for you. Do not share this medicine with  others. What if I miss a dose? If you miss a dose, take it as soon as you can. If it is almost time for your next dose, take only that dose. Do not take double or extra doses. What may interact with this medicine? Do not take this medicine with any of the following medications: -darbepoetin alfa This list may not describe all possible interactions. Give your health care provider a list of all the medicines, herbs, non-prescription drugs, or dietary supplements you use. Also tell them if you smoke, drink alcohol, or use illegal drugs. Some items may interact with your medicine. What should I watch for while using this medicine? Visit your prescriber or health care professional for regular checks on your progress and for the needed blood tests and blood pressure measurements. It is especially important for the doctor to make sure your hemoglobin level is in the desired range, to limit the risk of potential side effects and to give you the best benefit. Keep all appointments for any recommended tests. Check your blood pressure as directed. Ask your doctor what your blood pressure should be and when you should contact him or her. As your body makes more red blood cells, you may need to take iron, folic acid, or vitamin B supplements. Ask your doctor or health care provider which products are right for you. If you have kidney disease continue dietary restrictions, even though this medication can make you feel better. Talk with your doctor or health care professional about the foods you eat and the vitamins that you take. What side effects may I notice   from receiving this medicine? Side effects that you should report to your doctor or health care professional as soon as possible: -allergic reactions like skin rash, itching or hives, swelling of the face, lips, or tongue -breathing problems -changes in vision -chest pain -confusion, trouble speaking or understanding -feeling faint or lightheaded,  falls -high blood pressure -muscle aches or pains -pain, swelling, warmth in the leg -rapid weight gain -severe headaches -sudden numbness or weakness of the face, arm or leg -trouble walking, dizziness, loss of balance or coordination -seizures (convulsions) -swelling of the ankles, feet, hands -unusually weak or tired Side effects that usually do not require medical attention (report to your doctor or health care professional if they continue or are bothersome): -diarrhea -fever, chills (flu-like symptoms) -headaches -nausea, vomiting -redness, stinging, or swelling at site where injected This list may not describe all possible side effects. Call your doctor for medical advice about side effects. You may report side effects to FDA at 1-800-FDA-1088. Where should I keep my medicine? Keep out of the reach of children. Store in a refrigerator between 2 and 8 degrees C (36 and 46 degrees F). Do not freeze or shake. Throw away any unused portion if using a single-dose vial. Multi-dose vials can be kept in the refrigerator for up to 21 days after the initial dose. Throw away unused medicine. NOTE: This sheet is a summary. It may not cover all possible information. If you have questions about this medicine, talk to your doctor, pharmacist, or health care provider.  2012, [employer]. (08/18/2008 10:25:44 AM) 

## 2012-10-25 ENCOUNTER — Other Ambulatory Visit: Payer: Medicare Other | Admitting: Lab

## 2012-10-25 ENCOUNTER — Ambulatory Visit: Payer: Medicare Other

## 2012-10-25 ENCOUNTER — Ambulatory Visit: Payer: Medicare Other | Admitting: Medical

## 2012-10-30 ENCOUNTER — Telehealth: Payer: Self-pay | Admitting: Hematology & Oncology

## 2012-10-30 NOTE — Telephone Encounter (Signed)
Patient called and cx 10-31-12 apt and resch for 11-07-12 due to weather

## 2012-10-31 ENCOUNTER — Ambulatory Visit: Payer: Medicare Other | Admitting: Medical

## 2012-11-07 ENCOUNTER — Ambulatory Visit (HOSPITAL_BASED_OUTPATIENT_CLINIC_OR_DEPARTMENT_OTHER): Payer: Medicare Other

## 2012-11-07 ENCOUNTER — Ambulatory Visit (HOSPITAL_BASED_OUTPATIENT_CLINIC_OR_DEPARTMENT_OTHER): Payer: Medicare Other | Admitting: Medical

## 2012-11-07 ENCOUNTER — Other Ambulatory Visit (HOSPITAL_BASED_OUTPATIENT_CLINIC_OR_DEPARTMENT_OTHER): Payer: Medicare Other | Admitting: Lab

## 2012-11-07 VITALS — BP 151/64 | HR 70 | Temp 97.7°F | Resp 16 | Ht 65.0 in | Wt 189.0 lb

## 2012-11-07 DIAGNOSIS — D5720 Sickle-cell/Hb-C disease without crisis: Secondary | ICD-10-CM

## 2012-11-07 DIAGNOSIS — D649 Anemia, unspecified: Secondary | ICD-10-CM

## 2012-11-07 DIAGNOSIS — D696 Thrombocytopenia, unspecified: Secondary | ICD-10-CM

## 2012-11-07 DIAGNOSIS — N189 Chronic kidney disease, unspecified: Secondary | ICD-10-CM

## 2012-11-07 DIAGNOSIS — N289 Disorder of kidney and ureter, unspecified: Secondary | ICD-10-CM

## 2012-11-07 LAB — CBC WITH DIFFERENTIAL (CANCER CENTER ONLY)
BASO#: 0 10*3/uL (ref 0.0–0.2)
EOS%: 1.7 % (ref 0.0–7.0)
Eosinophils Absolute: 0.1 10*3/uL (ref 0.0–0.5)
HGB: 9.8 g/dL — ABNORMAL LOW (ref 11.6–15.9)
LYMPH#: 2.2 10*3/uL (ref 0.9–3.3)
MCHC: 35 g/dL (ref 32.0–36.0)
NEUT#: 4.2 10*3/uL (ref 1.5–6.5)
RBC: 4.07 10*6/uL (ref 3.70–5.32)

## 2012-11-07 MED ORDER — EPOETIN ALFA 40000 UNIT/ML IJ SOLN
40000.0000 [IU] | Freq: Once | INTRAMUSCULAR | Status: AC
  Administered 2012-11-07: 40000 [IU] via SUBCUTANEOUS

## 2012-11-07 NOTE — Progress Notes (Signed)
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 #3, insulin dependent diabetes. #4 anemia, secondary to renal insufficiency. #5.  Splenomegaly  Current therapy: #1 Procrit 40,000 units subcutaneous every 2 weeks.  For hemoglobin less than 11. #2 folic acid 1 mg by mouth daily.  Interim history: Rachel Palmer presents today for an office followup visit.  Overall, Rachel Palmer, reports, that she's been doing relatively well.  She does not have any problems with bowel or bladder issues.  She continues on folic acid 1 mg by mouth daily.  She still continues to travel to [place], [place] on a regular basis to see her sister.  She still continues to receive Procrit.  Her hemoglobin seems to be holding steady.  She does have diabetes, and I believe that a lot of her issues are related to her diabetes.  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  She follows up, down at [facility].  For this.  She also has kidney issues, and is followed at [facility].  She does have some splenomegaly.  This is probably workup that is causing some of her thrombocytopenia. The splenomegaly also might be part of her anemia, and increasing, sickle cell, obstruction.  She does have chronic fatigue.  She otherwise reports, that she has a good appetite.  She denies any nausea, vomiting, diarrhea, constipation, any chest pain, shortness of breath, or cough.  She denies any fevers, chills, or night sweats.  She denies any obvious, or abnormal bleeding.  She denies any lower leg swelling.  She denies any visual changes, rashes, or headaches.  Review of Systems:Significant for intermittent diarrhea/abdominal pain, otherwise: Pt. Denies any changes in their vision, hearing, adenopathy, fevers, chills, nausea, vomiting, constipation, chest pain, shortness of breath, passing blood, passing out, blacking out,  any changes in skin, joints, neurologic or psychiatric except as noted.  Physical Exam: This is a pleasant, well-developed, well-nourished,  72 year old, African American, female, in no obvious distress Vitals: Temperature 97.7 degrees, pulse 70, respirations 16, blood pressure 151/64.  Weight 189 pounds HEENT reveals a normocephalic, atraumatic skull, no scleral icterus, no oral lesions  Neck is supple without any cervical or supraclavicular adenopathy. +small possible thyroid nodule on right side Lungs are clear to auscultation bilaterally. There are no wheezes, rales or rhonci Cardiac is regular rate and rhythm with a normal S1 and S2. There are no murmurs, rubs, or bruits.  Abdomen is soft with good bowel sounds, there is no palpable mass. There is no palpable hepatosplenomegaly. There is no palpable fluid wave.  Musculoskeletal no tenderness of the spine, ribs, or hips.  Extremities there are no clubbing, cyanosis, or edema.  Skin no petechia, purpura or ecchymosis Neurologic is nonfocal.  Laboratory Data: White count 7.1, hemoglobin 9.8, hematocrit 20.0, platelets 91,000  Current Outpatient Prescriptions on File Prior to Visit  Medication Sig Dispense Refill  . albuterol (PROVENTIL HFA;VENTOLIN HFA) 108 (90 BASE) MCG/ACT inhaler Inhale 2 puffs into the lungs every 6 (six) hours as needed.       . cetirizine (ZYRTEC) 10 MG tablet Take 10 mg by mouth daily.       . fish oil-omega-3 fatty acids 1000 MG capsule Take 2 g by mouth daily.      . Fluticasone-Salmeterol (ADVAIR HFA IN) Inhale into the lungs 2 (two) times daily.       Marland Kitchen FOLIC ACID PO Take 1 mg by mouth daily.       Marland Kitchen GLIPIZIDE PO Take 10 mg by mouth daily.       Marland Kitchen  hydrochlorothiazide (MICROZIDE) 12.5 MG capsule Take 12.5 mg by mouth daily.      . Metoprolol Succinate (TOPROL XL PO) Take 100 mg by mouth daily.       Marland Kitchen omeprazole (PRILOSEC) 10 MG capsule Take 10 mg by mouth daily.      Marland Kitchen oxycodone (OXY-IR) 5 MG capsule Take 5 mg by mouth every 4 (four) hours as needed.      Marland Kitchen RAMIPRIL PO Take 10 mg by mouth 2 (two) times daily.       . SERTRALINE HCL PO Take 100 mg  by mouth daily. ZOLOFT      . fentaNYL (DURAGESIC - DOSED MCG/HR) 25 MCG/HR Place 1 patch onto the skin every 3 (three) days.      . montelukast (SINGULAIR) 10 MG tablet Take 10 mg by mouth every morning.       No current facility-administered medications on file prior to visit.   Assessment/Plan: This is a pleasant, 72 year old, female, with the following issues: #1, hemoglobin [place] disease-for the most part.  Her hemoglobin has improved with the Procrit.  When she goes to [place], [place]. she does not receive Procrit.  This could account for some of the DIPs in her hemoglobin. She will continue to come in every 2 weeks for CBC and possible Procrit injection.  #2 followup we will plan on seeing Rachel Palmer back in 6 weeks' time, but before then should there be questions or concerns.

## 2012-11-07 NOTE — Patient Instructions (Signed)
Epoetin Alfa injection What is this medicine? EPOETIN ALFA (e POE e tin AL fa) helps your body make more red blood cells. This medicine is used to treat anemia caused by chronic kidney failure, cancer chemotherapy, or HIV-therapy. It may also be used before surgery if you have anemia. This medicine may be used for other purposes; ask your health care provider or pharmacist if you have questions. What should I tell my health care provider before I take this medicine? They need to know if you have any of these conditions: -blood clotting disorders -cancer patient not on chemotherapy -cystic fibrosis -heart disease, such as angina or heart failure -hemoglobin level of 12 g/dL or greater -high blood pressure -low levels of folate, iron, or vitamin B12 -seizures -an unusual or allergic reaction to erythropoietin, albumin, benzyl alcohol, hamster proteins, other medicines, foods, dyes, or preservatives -pregnant or trying to get pregnant -breast-feeding How should I use this medicine? This medicine is for injection into a vein or under the skin. It is usually given by a health care professional in a hospital or clinic setting. If you get this medicine at home, you will be taught how to prepare and give this medicine. Use exactly as directed. Take your medicine at regular intervals. Do not take your medicine more often than directed. It is important that you put your used needles and syringes in a special sharps container. Do not put them in a trash can. If you do not have a sharps container, call your pharmacist or healthcare provider to get one. Talk to your pediatrician regarding the use of this medicine in children. While this drug may be prescribed for selected conditions, precautions do apply. Overdosage: If you think you have taken too much of this medicine contact a poison control center or emergency room at once. NOTE: This medicine is only for you. Do not share this medicine with  others. What if I miss a dose? If you miss a dose, take it as soon as you can. If it is almost time for your next dose, take only that dose. Do not take double or extra doses. What may interact with this medicine? Do not take this medicine with any of the following medications: -darbepoetin alfa This list may not describe all possible interactions. Give your health care provider a list of all the medicines, herbs, non-prescription drugs, or dietary supplements you use. Also tell them if you smoke, drink alcohol, or use illegal drugs. Some items may interact with your medicine. What should I watch for while using this medicine? Visit your prescriber or health care professional for regular checks on your progress and for the needed blood tests and blood pressure measurements. It is especially important for the doctor to make sure your hemoglobin level is in the desired range, to limit the risk of potential side effects and to give you the best benefit. Keep all appointments for any recommended tests. Check your blood pressure as directed. Ask your doctor what your blood pressure should be and when you should contact him or her. As your body makes more red blood cells, you may need to take iron, folic acid, or vitamin B supplements. Ask your doctor or health care provider which products are right for you. If you have kidney disease continue dietary restrictions, even though this medication can make you feel better. Talk with your doctor or health care professional about the foods you eat and the vitamins that you take. What side effects may I notice   from receiving this medicine? Side effects that you should report to your doctor or health care professional as soon as possible: -allergic reactions like skin rash, itching or hives, swelling of the face, lips, or tongue -breathing problems -changes in vision -chest pain -confusion, trouble speaking or understanding -feeling faint or lightheaded,  falls -high blood pressure -muscle aches or pains -pain, swelling, warmth in the leg -rapid weight gain -severe headaches -sudden numbness or weakness of the face, arm or leg -trouble walking, dizziness, loss of balance or coordination -seizures (convulsions) -swelling of the ankles, feet, hands -unusually weak or tired Side effects that usually do not require medical attention (report to your doctor or health care professional if they continue or are bothersome): -diarrhea -fever, chills (flu-like symptoms) -headaches -nausea, vomiting -redness, stinging, or swelling at site where injected This list may not describe all possible side effects. Call your doctor for medical advice about side effects. You may report side effects to FDA at 1-800-FDA-1088. Where should I keep my medicine? Keep out of the reach of children. Store in a refrigerator between 2 and 8 degrees C (36 and 46 degrees F). Do not freeze or shake. Throw away any unused portion if using a single-dose vial. Multi-dose vials can be kept in the refrigerator for up to 21 days after the initial dose. Throw away unused medicine. NOTE: This sheet is a summary. It may not cover all possible information. If you have questions about this medicine, talk to your doctor, pharmacist, or health care provider.  2013, [employer]. (08/18/2008 10:25:44 AM)  

## 2012-11-21 ENCOUNTER — Ambulatory Visit (HOSPITAL_BASED_OUTPATIENT_CLINIC_OR_DEPARTMENT_OTHER): Payer: Medicare Other

## 2012-11-21 ENCOUNTER — Other Ambulatory Visit (HOSPITAL_BASED_OUTPATIENT_CLINIC_OR_DEPARTMENT_OTHER): Payer: Medicare Other | Admitting: Lab

## 2012-11-21 VITALS — BP 143/75 | HR 75 | Temp 97.5°F | Resp 16

## 2012-11-21 DIAGNOSIS — N189 Chronic kidney disease, unspecified: Secondary | ICD-10-CM

## 2012-11-21 DIAGNOSIS — D649 Anemia, unspecified: Secondary | ICD-10-CM

## 2012-11-21 DIAGNOSIS — D631 Anemia in chronic kidney disease: Secondary | ICD-10-CM

## 2012-11-21 LAB — CBC WITH DIFFERENTIAL (CANCER CENTER ONLY)
BASO#: 0 10*3/uL (ref 0.0–0.2)
Eosinophils Absolute: 0.1 10*3/uL (ref 0.0–0.5)
HGB: 10.6 g/dL — ABNORMAL LOW (ref 11.6–15.9)
MCH: 24 pg — ABNORMAL LOW (ref 26.0–34.0)
MONO%: 6.6 % (ref 0.0–13.0)
NEUT#: 3.5 10*3/uL (ref 1.5–6.5)
RBC: 4.41 10*6/uL (ref 3.70–5.32)

## 2012-11-21 MED ORDER — EPOETIN ALFA 40000 UNIT/ML IJ SOLN
40000.0000 [IU] | Freq: Once | INTRAMUSCULAR | Status: AC
  Administered 2012-11-21: 40000 [IU] via SUBCUTANEOUS

## 2012-12-05 ENCOUNTER — Ambulatory Visit: Payer: Medicare Other

## 2012-12-05 ENCOUNTER — Other Ambulatory Visit (HOSPITAL_BASED_OUTPATIENT_CLINIC_OR_DEPARTMENT_OTHER): Payer: Medicare Other | Admitting: Lab

## 2012-12-05 DIAGNOSIS — D649 Anemia, unspecified: Secondary | ICD-10-CM

## 2012-12-05 DIAGNOSIS — D631 Anemia in chronic kidney disease: Secondary | ICD-10-CM

## 2012-12-05 LAB — CBC WITH DIFFERENTIAL (CANCER CENTER ONLY)
BASO#: 0 10*3/uL (ref 0.0–0.2)
Eosinophils Absolute: 0.1 10*3/uL (ref 0.0–0.5)
HGB: 11.3 g/dL — ABNORMAL LOW (ref 11.6–15.9)
LYMPH%: 38.5 % (ref 14.0–48.0)
MCH: 23.4 pg — ABNORMAL LOW (ref 26.0–34.0)
MCV: 68 fL — ABNORMAL LOW (ref 81–101)
MONO%: 7.7 % (ref 0.0–13.0)
NEUT#: 3.4 10*3/uL (ref 1.5–6.5)
RBC: 4.83 10*6/uL (ref 3.70–5.32)

## 2012-12-05 NOTE — Progress Notes (Signed)
Patient comes in today, CBC checked, Epogen injection not given due to parameters.  Hgb was 11.3 .  Patient made aware.

## 2012-12-19 ENCOUNTER — Ambulatory Visit (HOSPITAL_BASED_OUTPATIENT_CLINIC_OR_DEPARTMENT_OTHER): Payer: Medicare Other | Admitting: Hematology & Oncology

## 2012-12-19 ENCOUNTER — Other Ambulatory Visit (HOSPITAL_BASED_OUTPATIENT_CLINIC_OR_DEPARTMENT_OTHER): Payer: Medicare Other | Admitting: Lab

## 2012-12-19 ENCOUNTER — Ambulatory Visit (HOSPITAL_BASED_OUTPATIENT_CLINIC_OR_DEPARTMENT_OTHER): Payer: Medicare Other

## 2012-12-19 VITALS — BP 137/92 | HR 78 | Temp 97.9°F | Resp 18 | Ht 65.0 in | Wt 190.0 lb

## 2012-12-19 DIAGNOSIS — D631 Anemia in chronic kidney disease: Secondary | ICD-10-CM

## 2012-12-19 DIAGNOSIS — D649 Anemia, unspecified: Secondary | ICD-10-CM

## 2012-12-19 DIAGNOSIS — D469 Myelodysplastic syndrome, unspecified: Secondary | ICD-10-CM

## 2012-12-19 DIAGNOSIS — D5720 Sickle-cell/Hb-C disease without crisis: Secondary | ICD-10-CM

## 2012-12-19 DIAGNOSIS — N289 Disorder of kidney and ureter, unspecified: Secondary | ICD-10-CM

## 2012-12-19 DIAGNOSIS — D696 Thrombocytopenia, unspecified: Secondary | ICD-10-CM

## 2012-12-19 LAB — CBC WITH DIFFERENTIAL (CANCER CENTER ONLY)
BASO#: 0.1 10*3/uL (ref 0.0–0.2)
BASO%: 0.9 % (ref 0.0–2.0)
HCT: 25.1 % — ABNORMAL LOW (ref 34.8–46.6)
LYMPH%: 34.6 % (ref 14.0–48.0)
MCV: 68 fL — ABNORMAL LOW (ref 81–101)
MONO#: 0.4 10*3/uL (ref 0.1–0.9)
NEUT%: 55.8 % (ref 39.6–80.0)
RDW: 21.9 % — ABNORMAL HIGH (ref 11.1–15.7)
WBC: 6.5 10*3/uL (ref 3.9–10.0)

## 2012-12-19 MED ORDER — EPOETIN ALFA 40000 UNIT/ML IJ SOLN
40000.0000 [IU] | Freq: Once | INTRAMUSCULAR | Status: AC
  Administered 2012-12-19: 40000 [IU] via SUBCUTANEOUS

## 2012-12-19 NOTE — Progress Notes (Signed)
This office note has been dictated.

## 2012-12-20 NOTE — Progress Notes (Signed)
CC:   Rachel Palmer, M.D.  DIAGNOSES: 1. Hemoglobin [place] disease. 2. 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 3. 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  CURRENT THERAPY: 1. Procrit 40,000 units subcu as needed for hemoglobin less than 11. 2. Folic acid 1 mg daily.  INTERIM HISTORY:  Rachel Palmer comes in for followup.  She was last seen back 6 weeks ago.  She responds very well to Procrit when she needs it.  She feels okay.  She may feel a little bit tired.  She has other health issues that we have to watch out for.  The patient has diabetes.  I am still not sure how well that is controlled.  She has some renal insufficiency issues.  Again, this is more likely from chronic hypertension and her diabetes.  So far, I do not think iron overload has been a problem for [place].  Last time we checked was back in November and it was 857 with her ferritin.  PHYSICAL EXAMINATION:  General:  This is a well-developed, well- nourished African American female in no obvious distress.  Vital signs: Temperature of 97.9, pulse of 78, respiratory rate 18, blood pressure 137/92.  Weight is 190.  Head and neck:  Shows a normocephalic, atraumatic skull.  There is no scleral icterus.  There is no adenopathy in the neck.  There are no oral lesions.  Lungs:  Clear bilaterally. Cardiac:  Regular rate and rhythm with a normal S1 and S2.  There are no murmurs, rubs or bruits.  Abdomen:  Soft with good bowel sounds.  There is no palpable abdominal mass.  There is no palpable hepatosplenomegaly. Extremities:  Show no clubbing, cyanosis or edema.  She has good range motion of her joints.  Neurological:  Shows no focal neurological deficits.  LABORATORY STUDIES:  White cell count is 6.5, hemoglobin 8.6, hematocrit 25.1, platelet count 76,000.  MCV is 68.  IMPRESSION:  Rachel Palmer is a 72 year old African American female with multiple issues.  She has hemoglobin [place] disease.  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  She has anemia secondary to renal insufficiency which is from hypertension and diabetes.  We need to get her back on Procrit.  Again, she responds well to Procrit.  We do it every 2 weeks.  We will get her set up for every 2 weeks for the next 6-8 weeks.  When we see her back, we will have to recheck her iron studies.  We will also have to recheck her hemoglobin electrophoresis.  The thrombocytopenia really does not bother me that much.  She has this on occasion but is asymptomatic with this.    ______________________________ Josph Macho, M.D. PRE/MEDQ  D:  12/19/2012  T:  12/20/2012  Job:  [id number]

## 2012-12-24 LAB — COMPREHENSIVE METABOLIC PANEL
ALT: 10 U/L (ref 0–35)
AST: 12 U/L (ref 0–37)
Albumin: 4.7 g/dL (ref 3.5–5.2)
BUN: 16 mg/dL (ref 6–23)
Calcium: 8.8 mg/dL (ref 8.4–10.5)
Chloride: 112 mEq/L (ref 96–112)
Potassium: 4.2 mEq/L (ref 3.5–5.3)
Sodium: 141 mEq/L (ref 135–145)
Total Protein: 6.6 g/dL (ref 6.0–8.3)

## 2012-12-24 LAB — HEMOGLOBINOPATHY EVALUATION
Hemoglobin Other: 43.9 % — ABNORMAL HIGH
Hgb S Quant: 50.1 % — ABNORMAL HIGH

## 2013-01-02 ENCOUNTER — Ambulatory Visit (HOSPITAL_BASED_OUTPATIENT_CLINIC_OR_DEPARTMENT_OTHER): Payer: Medicare Other

## 2013-01-02 ENCOUNTER — Other Ambulatory Visit (HOSPITAL_BASED_OUTPATIENT_CLINIC_OR_DEPARTMENT_OTHER): Payer: Medicare Other | Admitting: Lab

## 2013-01-02 VITALS — BP 149/86 | HR 86 | Temp 97.7°F | Resp 20

## 2013-01-02 DIAGNOSIS — D631 Anemia in chronic kidney disease: Secondary | ICD-10-CM

## 2013-01-02 DIAGNOSIS — N189 Chronic kidney disease, unspecified: Secondary | ICD-10-CM

## 2013-01-02 DIAGNOSIS — N289 Disorder of kidney and ureter, unspecified: Secondary | ICD-10-CM

## 2013-01-02 DIAGNOSIS — D5720 Sickle-cell/Hb-C disease without crisis: Secondary | ICD-10-CM

## 2013-01-02 DIAGNOSIS — D649 Anemia, unspecified: Secondary | ICD-10-CM

## 2013-01-02 DIAGNOSIS — D469 Myelodysplastic syndrome, unspecified: Secondary | ICD-10-CM

## 2013-01-02 LAB — BASIC METABOLIC PANEL - CANCER CENTER ONLY
BUN, Bld: 21 mg/dL (ref 7–22)
CO2: 24 mEq/L (ref 18–33)
Calcium: 8.8 mg/dL (ref 8.0–10.3)
Chloride: 112 mEq/L — ABNORMAL HIGH (ref 98–108)
Creat: 1.5 mg/dl — ABNORMAL HIGH (ref 0.6–1.2)
Glucose, Bld: 206 mg/dL — ABNORMAL HIGH (ref 73–118)

## 2013-01-02 LAB — CBC WITH DIFFERENTIAL (CANCER CENTER ONLY)
BASO#: 0.1 10*3/uL (ref 0.0–0.2)
BASO%: 0.8 % (ref 0.0–2.0)
HCT: 28.9 % — ABNORMAL LOW (ref 34.8–46.6)
HGB: 10.1 g/dL — ABNORMAL LOW (ref 11.6–15.9)
LYMPH#: 1.9 10*3/uL (ref 0.9–3.3)
LYMPH%: 29.9 % (ref 14.0–48.0)
MCV: 67 fL — ABNORMAL LOW (ref 81–101)
MONO#: 0.4 10*3/uL (ref 0.1–0.9)
NEUT%: 60.6 % (ref 39.6–80.0)
RDW: 23.3 % — ABNORMAL HIGH (ref 11.1–15.7)
WBC: 6.4 10*3/uL (ref 3.9–10.0)

## 2013-01-02 MED ORDER — EPOETIN ALFA 40000 UNIT/ML IJ SOLN
40000.0000 [IU] | Freq: Once | INTRAMUSCULAR | Status: AC
  Administered 2013-01-02: 40000 [IU] via SUBCUTANEOUS

## 2013-01-15 ENCOUNTER — Ambulatory Visit (HOSPITAL_BASED_OUTPATIENT_CLINIC_OR_DEPARTMENT_OTHER)
Admission: RE | Admit: 2013-01-15 | Discharge: 2013-01-15 | Disposition: A | Discharge status: Home / Self Care | Payer: Medicare Other | Source: Ambulatory Visit | Attending: Medical | Admitting: Medical

## 2013-01-15 ENCOUNTER — Other Ambulatory Visit (HOSPITAL_BASED_OUTPATIENT_CLINIC_OR_DEPARTMENT_OTHER): Payer: Medicare Other | Admitting: Lab

## 2013-01-15 ENCOUNTER — Other Ambulatory Visit: Payer: Self-pay | Admitting: Medical

## 2013-01-15 ENCOUNTER — Ambulatory Visit (HOSPITAL_BASED_OUTPATIENT_CLINIC_OR_DEPARTMENT_OTHER): Payer: Medicare Other | Admitting: Medical

## 2013-01-15 ENCOUNTER — Telehealth: Payer: Self-pay | Admitting: *Deleted

## 2013-01-15 VITALS — BP 139/79 | Wt 188.0 lb

## 2013-01-15 DIAGNOSIS — D469 Myelodysplastic syndrome, unspecified: Secondary | ICD-10-CM

## 2013-01-15 DIAGNOSIS — D649 Anemia, unspecified: Secondary | ICD-10-CM

## 2013-01-15 DIAGNOSIS — M7989 Other specified soft tissue disorders: Secondary | ICD-10-CM | POA: Insufficient documentation

## 2013-01-15 DIAGNOSIS — D5720 Sickle-cell/Hb-C disease without crisis: Secondary | ICD-10-CM

## 2013-01-15 DIAGNOSIS — R600 Localized edema: Secondary | ICD-10-CM

## 2013-01-15 DIAGNOSIS — R609 Edema, unspecified: Secondary | ICD-10-CM

## 2013-01-15 DIAGNOSIS — N289 Disorder of kidney and ureter, unspecified: Secondary | ICD-10-CM

## 2013-01-15 DIAGNOSIS — M79609 Pain in unspecified limb: Secondary | ICD-10-CM | POA: Insufficient documentation

## 2013-01-15 LAB — TECHNOLOGIST REVIEW CHCC SATELLITE

## 2013-01-15 LAB — CBC WITH DIFFERENTIAL (CANCER CENTER ONLY)
BASO#: 0.1 10*3/uL (ref 0.0–0.2)
Eosinophils Absolute: 0.2 10*3/uL (ref 0.0–0.5)
HCT: 33 % — ABNORMAL LOW (ref 34.8–46.6)
HGB: 11.3 g/dL — ABNORMAL LOW (ref 11.6–15.9)
LYMPH%: 37.5 % (ref 14.0–48.0)
MCH: 23.1 pg — ABNORMAL LOW (ref 26.0–34.0)
MCV: 67 fL — ABNORMAL LOW (ref 81–101)
MONO%: 7.6 % (ref 0.0–13.0)
RBC: 4.9 10*6/uL (ref 3.70–5.32)

## 2013-01-15 LAB — BASIC METABOLIC PANEL
BUN: 21 mg/dL (ref 6–23)
Calcium: 8.9 mg/dL (ref 8.4–10.5)
Glucose, Bld: 326 mg/dL — ABNORMAL HIGH (ref 70–99)

## 2013-01-15 NOTE — Telephone Encounter (Signed)
Pt called wanting to know if she could have labs added on for tomorrow to check to see if she has gout. She started having right big toe pain radiating to the ball of her foot. It is warm to the touch and has tenderness in her calf area when flexed. She has not had gout in the past but "my husband has and I ate a lot of fish last night". Reviewed with Eunice Blase, PA. To have pt come in to be seen. ? Gout vs DVT. Pt verbalized understanding and knows to be here before 3:15p.

## 2013-01-15 NOTE — Progress Notes (Signed)
DIAGNOSES: 1. Hemoglobin [place] disease. 2. 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 3. 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  CURRENT THERAPY: 1. Procrit 40,000 units subcu as needed for hemoglobin less than 11. 2. Folic acid 1 mg daily.  INTERIM HISTORY: Rachel Palmer presents today as a work in.  Rachel Palmer states, that she called and spoke to one of our nurses to see if she could have a uric acid drawn with her lab work tomorrow.  She states, about a day or 2 ago her left foot started to swell.  She, reports, that there was some erythema and it was very painful.  She states, that she could barely, with stand anything on it.  She thought that it might be gout and wanted her uric acid level checked.  She states, that she's never had gout before.  We decided to bring her in to rule out a DVT.  She's not reporting any pain or swelling in the calf.  She does not report a palpable cord.  She's not reporting any type of chest pain, or shortness of breath.  She does have a difficult time walking, and she is unable to put any pressure on her left extremity.  I explained her, that we'll go ahead and get a Doppler ultrasound of the left lower extremity to rule out a deep vein thrombosis.  She is agreeable to this.  Of note, she was originally, due to have a lab appointment tomorrow for possible Procrit injection.  Her hemoglobin is above 11.  Today, as, such, she will not require an Aranesp injection.  Review of Systems: Constitutional:Negative for malaise/fatigue, fever, chills, weight loss, diaphoresis, activity change, appetite change, and unexpected weight change.  HEENT: Negative for double vision, blurred vision, visual loss, ear pain, tinnitus, congestion, rhinorrhea, epistaxis sore throat or sinus disease, oral pain/lesion, tongue soreness Respiratory: Negative for cough, chest tightness, shortness of breath, wheezing and stridor.  Cardiovascular: Negative for chest pain,  palpitations, leg swelling, orthopnea, PND, DOE or claudication Gastrointestinal: Negative for nausea, vomiting, abdominal pain, diarrhea, constipation, blood in stool, melena, hematochezia, abdominal distention, anal bleeding, rectal pain, anorexia and hematemesis.  Genitourinary: Negative for dysuria, frequency, hematuria,  Musculoskeletal: Negative for myalgias, back pain, joint swelling, arthralgias and gait problem.  Skin: Negative for rash, color change, pallor and wound.  Neurological:. Negative for dizziness/light-headedness, tremors, seizures, syncope, facial asymmetry, speech difficulty, weakness, numbness, headaches and paresthesias.  Hematological: Negative for adenopathy. Does not bruise/bleed easily.  Psychiatric/Behavioral:  Negative for depression, no loss of interest in normal activity or change in sleep pattern.   Physical Exam: This is a 72 year old, African American, female, well-developed, well-nourished, in no obvious distress Vitals: Temperature 98.0 degrees, pulse 95, respirations 18, blood pressure 139/79, weight 188 pounds HEENT reveals a normocephalic, atraumatic skull, no scleral icterus, no oral lesions  Neck is supple without any cervical or supraclavicular adenopathy.  Lungs are clear to auscultation bilaterally. There are no wheezes, rales or rhonci Cardiac is regular rate and rhythm with a normal S1 and S2. There are no murmurs, rubs, or bruits.  Abdomen is soft with good bowel sounds, there is no palpable mass. There is no palpable hepatosplenomegaly. There is no palpable fluid wave.  Musculoskeletal no tenderness of the spine, ribs, or hips.  Extremities there are no clubbing, cyanosis.  Her left foot is edematous and erythematous, with tenderness to palpation.  There is no palpable cord Homans sign is negative.  Skin no petechia, purpura or ecchymosis Neurologic is nonfocal.  Laboratory Data: White count 9.3, hemoglobin 11.3, hematocrit 33.3, platelets  132,000  Current Outpatient Prescriptions on File Prior to Visit  Medication Sig Dispense Refill  . albuterol (PROVENTIL HFA;VENTOLIN HFA) 108 (90 BASE) MCG/ACT inhaler Inhale 2 puffs into the lungs every 6 (six) hours as needed.       . cetirizine (ZYRTEC) 10 MG tablet Take 10 mg by mouth daily.       . fish oil-omega-3 fatty acids 1000 MG capsule Take 2 g by mouth daily.      . Fluticasone-Salmeterol (ADVAIR HFA IN) Inhale into the lungs 2 (two) times daily.       Marland Kitchen FOLIC ACID PO Take 1 mg by mouth daily.       Marland Kitchen GLIPIZIDE PO Take 10 mg by mouth daily.       . hydrochlorothiazide (MICROZIDE) 12.5 MG capsule Take 12.5 mg by mouth daily.      . Metoprolol Succinate (TOPROL XL PO) Take 100 mg by mouth daily.       . montelukast (SINGULAIR) 10 MG tablet Take 10 mg by mouth every morning.      Marland Kitchen omeprazole (PRILOSEC) 10 MG capsule Take 10 mg by mouth daily.      Marland Kitchen oxycodone (OXY-IR) 5 MG capsule Take 5 mg by mouth every 4 (four) hours as needed.      Marland Kitchen RAMIPRIL PO Take 10 mg by mouth 2 (two) times daily.       . SERTRALINE HCL PO Take 100 mg by mouth daily. ZOLOFT      . UNKNOWN TO PATIENT Take by mouth 3 times daily with meals, bedtime and 2 AM. Takes "something for diarrhea" tid PRN       No current facility-administered medications on file prior to visit.   Assessment/Plan: This is a pleasant, 72 year old, African American, female, with the following issues:  #1 left foot edema.  This possibly could be gout.  I am going to go ahead and get a Doppler ultrasound of the left lower extremity to rule out a DVT.  If in fact, this is a DVT, we will go ahead and start anticoagulation.  If she does not have a DVT, I advised her to followup with her primary care physician for possible gout.  #2.  Anemia, secondary to renal insufficiency/sickle cell.  Her hemoglobin is above 11.  Today, as, such, she will not require an Aranesp injection.  #3.  Hemoglobin [place] disease.  She remains on folic acid 1 mg  daily.  #4.  Chronic thrombocytopenia, secondary to splenomegaly.  Her platelet count is stable.  She does not have any obvious, bleeding.  #5.  Rachel Palmer will follow up with [place] on 01/30/2013, but before then should there be questions or concerns.

## 2013-01-16 ENCOUNTER — Other Ambulatory Visit: Payer: Medicare Other | Admitting: Lab

## 2013-01-16 ENCOUNTER — Ambulatory Visit: Payer: Medicare Other

## 2013-01-30 ENCOUNTER — Ambulatory Visit (HOSPITAL_BASED_OUTPATIENT_CLINIC_OR_DEPARTMENT_OTHER): Payer: Medicare Other | Admitting: Hematology & Oncology

## 2013-01-30 ENCOUNTER — Ambulatory Visit (HOSPITAL_BASED_OUTPATIENT_CLINIC_OR_DEPARTMENT_OTHER): Payer: Medicare Other | Admitting: Lab

## 2013-01-30 ENCOUNTER — Ambulatory Visit (HOSPITAL_BASED_OUTPATIENT_CLINIC_OR_DEPARTMENT_OTHER): Payer: Medicare Other

## 2013-01-30 DIAGNOSIS — N189 Chronic kidney disease, unspecified: Secondary | ICD-10-CM

## 2013-01-30 DIAGNOSIS — D631 Anemia in chronic kidney disease: Secondary | ICD-10-CM

## 2013-01-30 DIAGNOSIS — D469 Myelodysplastic syndrome, unspecified: Secondary | ICD-10-CM

## 2013-01-30 DIAGNOSIS — D5720 Sickle-cell/Hb-C disease without crisis: Secondary | ICD-10-CM

## 2013-01-30 LAB — CBC WITH DIFFERENTIAL (CANCER CENTER ONLY)
BASO%: 0.7 % (ref 0.0–2.0)
EOS%: 2.3 % (ref 0.0–7.0)
Eosinophils Absolute: 0.1 10*3/uL (ref 0.0–0.5)
LYMPH%: 35.1 % (ref 14.0–48.0)
MCH: 22.7 pg — ABNORMAL LOW (ref 26.0–34.0)
MCHC: 34 g/dL (ref 32.0–36.0)
MCV: 67 fL — ABNORMAL LOW (ref 81–101)
MONO%: 6.9 % (ref 0.0–13.0)
Platelets: 100 10*3/uL — ABNORMAL LOW (ref 145–400)
RDW: 22.3 % — ABNORMAL HIGH (ref 11.1–15.7)

## 2013-01-30 LAB — BASIC METABOLIC PANEL
BUN: 18 mg/dL (ref 6–23)
Creatinine, Ser: 1.55 mg/dL — ABNORMAL HIGH (ref 0.50–1.10)
Potassium: 3.9 mEq/L (ref 3.5–5.3)

## 2013-01-30 LAB — IRON AND TIBC: Iron: 73 ug/dL (ref 42–145)

## 2013-01-30 LAB — FERRITIN: Ferritin: 842 ng/mL — ABNORMAL HIGH (ref 10–291)

## 2013-01-30 MED ORDER — EPOETIN ALFA 40000 UNIT/ML IJ SOLN
40000.0000 [IU] | Freq: Once | INTRAMUSCULAR | Status: AC
  Administered 2013-01-30: 40000 [IU] via SUBCUTANEOUS

## 2013-01-30 NOTE — Progress Notes (Signed)
This office note has been dictated.

## 2013-01-30 NOTE — Patient Instructions (Signed)
Epoetin Alfa injection What is this medicine? EPOETIN ALFA (e POE e tin AL fa) helps your body make more red blood cells. This medicine is used to treat anemia caused by chronic kidney failure, cancer chemotherapy, or HIV-therapy. It may also be used before surgery if you have anemia. This medicine may be used for other purposes; ask your health care provider or pharmacist if you have questions. What should I tell my health care provider before I take this medicine? They need to know if you have any of these conditions: -blood clotting disorders -cancer patient not on chemotherapy -cystic fibrosis -heart disease, such as angina or heart failure -hemoglobin level of 12 g/dL or greater -high blood pressure -low levels of folate, iron, or vitamin B12 -seizures -an unusual or allergic reaction to erythropoietin, albumin, benzyl alcohol, hamster proteins, other medicines, foods, dyes, or preservatives -pregnant or trying to get pregnant -breast-feeding How should I use this medicine? This medicine is for injection into a vein or under the skin. It is usually given by a health care professional in a hospital or clinic setting. If you get this medicine at home, you will be taught how to prepare and give this medicine. Use exactly as directed. Take your medicine at regular intervals. Do not take your medicine more often than directed. It is important that you put your used needles and syringes in a special sharps container. Do not put them in a trash can. If you do not have a sharps container, call your pharmacist or healthcare provider to get one. Talk to your pediatrician regarding the use of this medicine in children. While this drug may be prescribed for selected conditions, precautions do apply. Overdosage: If you think you have taken too much of this medicine contact a poison control center or emergency room at once. NOTE: This medicine is only for you. Do not share this medicine with  others. What if I miss a dose? If you miss a dose, take it as soon as you can. If it is almost time for your next dose, take only that dose. Do not take double or extra doses. What may interact with this medicine? Do not take this medicine with any of the following medications: -darbepoetin alfa This list may not describe all possible interactions. Give your health care provider a list of all the medicines, herbs, non-prescription drugs, or dietary supplements you use. Also tell them if you smoke, drink alcohol, or use illegal drugs. Some items may interact with your medicine. What should I watch for while using this medicine? Visit your prescriber or health care professional for regular checks on your progress and for the needed blood tests and blood pressure measurements. It is especially important for the doctor to make sure your hemoglobin level is in the desired range, to limit the risk of potential side effects and to give you the best benefit. Keep all appointments for any recommended tests. Check your blood pressure as directed. Ask your doctor what your blood pressure should be and when you should contact him or her. As your body makes more red blood cells, you may need to take iron, folic acid, or vitamin B supplements. Ask your doctor or health care provider which products are right for you. If you have kidney disease continue dietary restrictions, even though this medication can make you feel better. Talk with your doctor or health care professional about the foods you eat and the vitamins that you take. What side effects may I notice   from receiving this medicine? Side effects that you should report to your doctor or health care professional as soon as possible: -allergic reactions like skin rash, itching or hives, swelling of the face, lips, or tongue -breathing problems -changes in vision -chest pain -confusion, trouble speaking or understanding -feeling faint or lightheaded,  falls -high blood pressure -muscle aches or pains -pain, swelling, warmth in the leg -rapid weight gain -severe headaches -sudden numbness or weakness of the face, arm or leg -trouble walking, dizziness, loss of balance or coordination -seizures (convulsions) -swelling of the ankles, feet, hands -unusually weak or tired Side effects that usually do not require medical attention (report to your doctor or health care professional if they continue or are bothersome): -diarrhea -fever, chills (flu-like symptoms) -headaches -nausea, vomiting -redness, stinging, or swelling at site where injected This list may not describe all possible side effects. Call your doctor for medical advice about side effects. You may report side effects to FDA at 1-800-FDA-1088. Where should I keep my medicine? Keep out of the reach of children. Store in a refrigerator between 2 and 8 degrees C (36 and 46 degrees F). Do not freeze or shake. Throw away any unused portion if using a single-dose vial. Multi-dose vials can be kept in the refrigerator for up to 21 days after the initial dose. Throw away unused medicine. NOTE: This sheet is a summary. It may not cover all possible information. If you have questions about this medicine, talk to your doctor, pharmacist, or health care provider.  2012, [employer]. (08/18/2008 10:25:44 AM) 

## 2013-01-31 NOTE — Progress Notes (Signed)
DIAGNOSES: 1. Hemoglobin [place] disease. 2. 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 3. Renal insufficiency with anemia.  CURRENT THERAPY: 1. Folic acid 1 mg p.o. daily. 2. Procrit 40,000 units subcu as needed for hemoglobin less than 11.  INTERIM HISTORY:  Rachel Palmer comes in for followup.  She has decided she is probably going to have bariatric surgery.  I do not think this is a bad idea for her.  I think this will really help her out and make life easier for her.  She has diabetes.  She is on I think insulin and oral hypoglycemics.  She really wants to get off these.  She is having no worsening issues with respect to her renal function. There has been no fluid retention.  She did have a venous Doppler of her lower legs.  There is some concern about some swelling in the left leg.  On 04/30 Doppler was negative.  We are following her iron studies.  Her last ferritin back in early April was 981.  PHYSICAL EXAMINATION:  General:  This is a somewhat obese black female in no obvious distress.  Vital signs:  Show temperature of 98, pulse 59, respiratory rate 16, blood pressure 139/55.  Weight is 187.  Head and neck:  Shows a normocephalic, atraumatic skull.  There are no ocular or oral lesions.  There are no palpable cervical or supraclavicular lymph nodes.  Lungs:  Clear to percussion and auscultation bilaterally. Cardiac:  Regular rate and rhythm with a normal S1 and S2.  There are no murmurs, rubs or bruits.  Abdomen:  Soft, mildly obese.  She has good bowel sounds.  There is no fluid wave.  There is no palpable hepatosplenomegaly.  Extremities:  Show some trace edema in her lower legs.  LABORATORY STUDIES:  White cell count is 5.6, hemoglobin 9.8, hematocrit 28.8, platelet count 100,000.  IMPRESSION:  Rachel Palmer is a very nice 72 year old African American female with hemoglobin [place] disease.  She actually has done very well with this.  We will go ahead and give a dose  of Procrit today.  Procrit seems to work best for her.  I do not see any indication of iron overload.  Despite the high ferritin, her iron saturation studies have not shown a high iron level.  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  I just want to make sure that we optimize her hemoglobin for the procedure.  We will continue to get her back to have her labs checked every 2 weeks.  I will see her back myself in another 4-6 weeks.    ______________________________ Rachel Palmer, M.D. PRE/MEDQ  D:  01/30/2013  T:  01/31/2013  Job:  [id number]

## 2013-02-03 LAB — HEMOGLOBINOPATHY EVALUATION
Hemoglobin Other: 43.1 % — ABNORMAL HIGH
Hgb F Quant: 2.5 % — ABNORMAL HIGH (ref 0.0–2.0)
Hgb S Quant: 53 % — ABNORMAL HIGH

## 2013-02-13 ENCOUNTER — Ambulatory Visit (HOSPITAL_BASED_OUTPATIENT_CLINIC_OR_DEPARTMENT_OTHER): Payer: Medicare Other

## 2013-02-13 ENCOUNTER — Other Ambulatory Visit (HOSPITAL_BASED_OUTPATIENT_CLINIC_OR_DEPARTMENT_OTHER): Payer: Medicare Other | Admitting: Lab

## 2013-02-13 VITALS — BP 144/85 | HR 62 | Temp 98.1°F | Resp 18

## 2013-02-13 DIAGNOSIS — D4620 Refractory anemia with excess of blasts, unspecified: Secondary | ICD-10-CM

## 2013-02-13 DIAGNOSIS — N289 Disorder of kidney and ureter, unspecified: Secondary | ICD-10-CM

## 2013-02-13 DIAGNOSIS — N189 Chronic kidney disease, unspecified: Secondary | ICD-10-CM

## 2013-02-13 DIAGNOSIS — D649 Anemia, unspecified: Secondary | ICD-10-CM

## 2013-02-13 DIAGNOSIS — D469 Myelodysplastic syndrome, unspecified: Secondary | ICD-10-CM

## 2013-02-13 DIAGNOSIS — D631 Anemia in chronic kidney disease: Secondary | ICD-10-CM

## 2013-02-13 LAB — CBC WITH DIFFERENTIAL (CANCER CENTER ONLY)
Eosinophils Absolute: 0.1 10*3/uL (ref 0.0–0.5)
HCT: 28.9 % — ABNORMAL LOW (ref 34.8–46.6)
LYMPH%: 35 % (ref 14.0–48.0)
MCH: 23 pg — ABNORMAL LOW (ref 26.0–34.0)
MCV: 67 fL — ABNORMAL LOW (ref 81–101)
MONO#: 0.5 10*3/uL (ref 0.1–0.9)
NEUT%: 55 % (ref 39.6–80.0)
RBC: 4.34 10*6/uL (ref 3.70–5.32)
RDW: 22.4 % — ABNORMAL HIGH (ref 11.1–15.7)
WBC: 6.3 10*3/uL (ref 3.9–10.0)

## 2013-02-13 LAB — BASIC METABOLIC PANEL
BUN: 31 mg/dL — ABNORMAL HIGH (ref 6–23)
CO2: 19 mEq/L (ref 19–32)
Chloride: 111 mEq/L (ref 96–112)
Creatinine, Ser: 1.56 mg/dL — ABNORMAL HIGH (ref 0.50–1.10)

## 2013-02-13 MED ORDER — EPOETIN ALFA 40000 UNIT/ML IJ SOLN
40000.0000 [IU] | Freq: Once | INTRAMUSCULAR | Status: AC
  Administered 2013-02-13: 40000 [IU] via SUBCUTANEOUS

## 2013-02-13 NOTE — Patient Instructions (Signed)
Epoetin Alfa injection What is this medicine? EPOETIN ALFA (e POE e tin AL fa) helps your body make more red blood cells. This medicine is used to treat anemia caused by chronic kidney failure, cancer chemotherapy, or HIV-therapy. It may also be used before surgery if you have anemia. This medicine may be used for other purposes; ask your health care provider or pharmacist if you have questions. What should I tell my health care provider before I take this medicine? They need to know if you have any of these conditions: -blood clotting disorders -cancer patient not on chemotherapy -cystic fibrosis -heart disease, such as angina or heart failure -hemoglobin level of 12 g/dL or greater -high blood pressure -low levels of folate, iron, or vitamin B12 -seizures -an unusual or allergic reaction to erythropoietin, albumin, benzyl alcohol, hamster proteins, other medicines, foods, dyes, or preservatives -pregnant or trying to get pregnant -breast-feeding How should I use this medicine? This medicine is for injection into a vein or under the skin. It is usually given by a health care professional in a hospital or clinic setting. If you get this medicine at home, you will be taught how to prepare and give this medicine. Use exactly as directed. Take your medicine at regular intervals. Do not take your medicine more often than directed. It is important that you put your used needles and syringes in a special sharps container. Do not put them in a trash can. If you do not have a sharps container, call your pharmacist or healthcare provider to get one. Talk to your pediatrician regarding the use of this medicine in children. While this drug may be prescribed for selected conditions, precautions do apply. Overdosage: If you think you have taken too much of this medicine contact a poison control center or emergency room at once. NOTE: This medicine is only for you. Do not share this medicine with  others. What if I miss a dose? If you miss a dose, take it as soon as you can. If it is almost time for your next dose, take only that dose. Do not take double or extra doses. What may interact with this medicine? Do not take this medicine with any of the following medications: -darbepoetin alfa This list may not describe all possible interactions. Give your health care provider a list of all the medicines, herbs, non-prescription drugs, or dietary supplements you use. Also tell them if you smoke, drink alcohol, or use illegal drugs. Some items may interact with your medicine. What should I watch for while using this medicine? Visit your prescriber or health care professional for regular checks on your progress and for the needed blood tests and blood pressure measurements. It is especially important for the doctor to make sure your hemoglobin level is in the desired range, to limit the risk of potential side effects and to give you the best benefit. Keep all appointments for any recommended tests. Check your blood pressure as directed. Ask your doctor what your blood pressure should be and when you should contact him or her. As your body makes more red blood cells, you may need to take iron, folic acid, or vitamin B supplements. Ask your doctor or health care provider which products are right for you. If you have kidney disease continue dietary restrictions, even though this medication can make you feel better. Talk with your doctor or health care professional about the foods you eat and the vitamins that you take. What side effects may I notice   from receiving this medicine? Side effects that you should report to your doctor or health care professional as soon as possible: -allergic reactions like skin rash, itching or hives, swelling of the face, lips, or tongue -breathing problems -changes in vision -chest pain -confusion, trouble speaking or understanding -feeling faint or lightheaded,  falls -high blood pressure -muscle aches or pains -pain, swelling, warmth in the leg -rapid weight gain -severe headaches -sudden numbness or weakness of the face, arm or leg -trouble walking, dizziness, loss of balance or coordination -seizures (convulsions) -swelling of the ankles, feet, hands -unusually weak or tired Side effects that usually do not require medical attention (report to your doctor or health care professional if they continue or are bothersome): -diarrhea -fever, chills (flu-like symptoms) -headaches -nausea, vomiting -redness, stinging, or swelling at site where injected This list may not describe all possible side effects. Call your doctor for medical advice about side effects. You may report side effects to FDA at 1-800-FDA-1088. Where should I keep my medicine? Keep out of the reach of children. Store in a refrigerator between 2 and 8 degrees C (36 and 46 degrees F). Do not freeze or shake. Throw away any unused portion if using a single-dose vial. Multi-dose vials can be kept in the refrigerator for up to 21 days after the initial dose. Throw away unused medicine. NOTE: This sheet is a summary. It may not cover all possible information. If you have questions about this medicine, talk to your doctor, pharmacist, or health care provider.  2013, [employer]. (08/18/2008 10:25:44 AM)  

## 2013-02-25 ENCOUNTER — Other Ambulatory Visit (HOSPITAL_COMMUNITY): Payer: Self-pay | Admitting: Internal Medicine

## 2013-02-25 DIAGNOSIS — E042 Nontoxic multinodular goiter: Secondary | ICD-10-CM

## 2013-02-27 ENCOUNTER — Ambulatory Visit (HOSPITAL_BASED_OUTPATIENT_CLINIC_OR_DEPARTMENT_OTHER): Payer: Medicare Other

## 2013-02-27 ENCOUNTER — Telehealth: Payer: Self-pay | Admitting: Hematology & Oncology

## 2013-02-27 ENCOUNTER — Ambulatory Visit (HOSPITAL_BASED_OUTPATIENT_CLINIC_OR_DEPARTMENT_OTHER): Payer: Medicare Other | Admitting: Lab

## 2013-02-27 VITALS — BP 130/76 | HR 63 | Temp 97.6°F | Resp 20

## 2013-02-27 DIAGNOSIS — D5720 Sickle-cell/Hb-C disease without crisis: Secondary | ICD-10-CM

## 2013-02-27 DIAGNOSIS — N039 Chronic nephritic syndrome with unspecified morphologic changes: Secondary | ICD-10-CM

## 2013-02-27 DIAGNOSIS — D631 Anemia in chronic kidney disease: Secondary | ICD-10-CM

## 2013-02-27 DIAGNOSIS — N189 Chronic kidney disease, unspecified: Secondary | ICD-10-CM

## 2013-02-27 LAB — CBC WITH DIFFERENTIAL (CANCER CENTER ONLY)
BASO%: 0.5 % (ref 0.0–2.0)
EOS%: 2 % (ref 0.0–7.0)
HCT: 29.2 % — ABNORMAL LOW (ref 34.8–46.6)
LYMPH%: 40.1 % (ref 14.0–48.0)
MCHC: 34.6 g/dL (ref 32.0–36.0)
MCV: 67 fL — ABNORMAL LOW (ref 81–101)
MONO%: 6.4 % (ref 0.0–13.0)
NEUT%: 51 % (ref 39.6–80.0)
Platelets: 101 10*3/uL — ABNORMAL LOW (ref 145–400)
RDW: 22.6 % — ABNORMAL HIGH (ref 11.1–15.7)

## 2013-02-27 MED ORDER — EPOETIN ALFA 20000 UNIT/ML IJ SOLN
40000.0000 [IU] | Freq: Once | INTRAMUSCULAR | Status: AC
  Administered 2013-02-27: 40000 [IU] via SUBCUTANEOUS

## 2013-02-27 NOTE — Patient Instructions (Signed)
Epoetin Alfa injection What is this medicine? EPOETIN ALFA (e POE e tin AL fa) helps your body make more red blood cells. This medicine is used to treat anemia caused by chronic kidney failure, cancer chemotherapy, or HIV-therapy. It may also be used before surgery if you have anemia. This medicine may be used for other purposes; ask your health care provider or pharmacist if you have questions. What should I tell my health care provider before I take this medicine? They need to know if you have any of these conditions: -blood clotting disorders -cancer patient not on chemotherapy -cystic fibrosis -heart disease, such as angina or heart failure -hemoglobin level of 12 g/dL or greater -high blood pressure -low levels of folate, iron, or vitamin B12 -seizures -an unusual or allergic reaction to erythropoietin, albumin, benzyl alcohol, hamster proteins, other medicines, foods, dyes, or preservatives -pregnant or trying to get pregnant -breast-feeding How should I use this medicine? This medicine is for injection into a vein or under the skin. It is usually given by a health care professional in a hospital or clinic setting. If you get this medicine at home, you will be taught how to prepare and give this medicine. Use exactly as directed. Take your medicine at regular intervals. Do not take your medicine more often than directed. It is important that you put your used needles and syringes in a special sharps container. Do not put them in a trash can. If you do not have a sharps container, call your pharmacist or healthcare provider to get one. Talk to your pediatrician regarding the use of this medicine in children. While this drug may be prescribed for selected conditions, precautions do apply. Overdosage: If you think you have taken too much of this medicine contact a poison control center or emergency room at once. NOTE: This medicine is only for you. Do not share this medicine with  others. What if I miss a dose? If you miss a dose, take it as soon as you can. If it is almost time for your next dose, take only that dose. Do not take double or extra doses. What may interact with this medicine? Do not take this medicine with any of the following medications: -darbepoetin alfa This list may not describe all possible interactions. Give your health care provider a list of all the medicines, herbs, non-prescription drugs, or dietary supplements you use. Also tell them if you smoke, drink alcohol, or use illegal drugs. Some items may interact with your medicine. What should I watch for while using this medicine? Visit your prescriber or health care professional for regular checks on your progress and for the needed blood tests and blood pressure measurements. It is especially important for the doctor to make sure your hemoglobin level is in the desired range, to limit the risk of potential side effects and to give you the best benefit. Keep all appointments for any recommended tests. Check your blood pressure as directed. Ask your doctor what your blood pressure should be and when you should contact him or her. As your body makes more red blood cells, you may need to take iron, folic acid, or vitamin B supplements. Ask your doctor or health care provider which products are right for you. If you have kidney disease continue dietary restrictions, even though this medication can make you feel better. Talk with your doctor or health care professional about the foods you eat and the vitamins that you take. What side effects may I notice   from receiving this medicine? Side effects that you should report to your doctor or health care professional as soon as possible: -allergic reactions like skin rash, itching or hives, swelling of the face, lips, or tongue -breathing problems -changes in vision -chest pain -confusion, trouble speaking or understanding -feeling faint or lightheaded,  falls -high blood pressure -muscle aches or pains -pain, swelling, warmth in the leg -rapid weight gain -severe headaches -sudden numbness or weakness of the face, arm or leg -trouble walking, dizziness, loss of balance or coordination -seizures (convulsions) -swelling of the ankles, feet, hands -unusually weak or tired Side effects that usually do not require medical attention (report to your doctor or health care professional if they continue or are bothersome): -diarrhea -fever, chills (flu-like symptoms) -headaches -nausea, vomiting -redness, stinging, or swelling at site where injected This list may not describe all possible side effects. Call your doctor for medical advice about side effects. You may report side effects to FDA at 1-800-FDA-1088. Where should I keep my medicine? Keep out of the reach of children. Store in a refrigerator between 2 and 8 degrees C (36 and 46 degrees F). Do not freeze or shake. Throw away any unused portion if using a single-dose vial. Multi-dose vials can be kept in the refrigerator for up to 21 days after the initial dose. Throw away unused medicine. NOTE: This sheet is a summary. It may not cover all possible information. If you have questions about this medicine, talk to your doctor, pharmacist, or health care provider.  2013, [employer]. (08/18/2008 10:25:44 AM)  

## 2013-02-27 NOTE — Telephone Encounter (Signed)
Pt cx [id number] said would call to reschedule she will be gone till the end of July.

## 2013-02-27 NOTE — Progress Notes (Unsigned)
Patient states her R lower arm has been swollen over the past few weeks. States it was red and irritated so she used Benadryl and some neosporin. The swelling has gone away about 75% but still some mild swelling. Denies any pain today at the site. She verbalized that she will continue to watch and if further complications occur will notify the office.

## 2013-03-04 ENCOUNTER — Ambulatory Visit (HOSPITAL_COMMUNITY): Payer: Medicare Other

## 2013-03-14 ENCOUNTER — Other Ambulatory Visit: Payer: Medicare Other | Admitting: Lab

## 2013-03-14 ENCOUNTER — Ambulatory Visit: Payer: Medicare Other

## 2013-03-14 ENCOUNTER — Ambulatory Visit: Payer: Medicare Other | Admitting: Hematology & Oncology

## 2013-03-26 NOTE — Progress Notes (Signed)
DIAGNOSES: 1. Hemoglobin [place] disease. 2. Chronic thrombocytopenia. 3. Splenomegaly. 4. 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 5. Insulin-dependent diabetes.  CURRENT THERAPY: 1. Procrit 40,000 units subcu as needed for hemoglobin less than 11. 2. Folic acid 1 mg p.o. daily.  INTERIM HISTORY:  Ms. Alonzo comes in for followup.  She is doing okay. She does have some fatigue.  We last saw her back in August.  She had been going up to [place], [place].  she has family up there.  She is seen out at [facility] every now and then.  She has had no problems with her blood sugars.  She has had no fevers, sweats or chills.  She has had no change in bowel or bladder habits.  She still is on quite a few medications.  PHYSICAL EXAMINATION:  General:  This is a well-developed, well- nourished African American female in no obvious distress.  Vital signs: Temperature of 97.6, pulse 90, respiratory rate 18, blood pressure 128/58.  Weight is 187 pounds.  Head and neck:  Normocephalic, atraumatic skull.  There are no ocular or oral lesions.  There are no palpable cervical or supraclavicular lymph nodes.  Lungs:  Clear bilaterally.  Cardiac:  Regular rate and rhythm with a normal S1 and S2. There are no murmurs, rubs or bruits.  Abdomen:  Soft with good bowel sounds.  There is no palpable hepatomegaly.  Spleen tip is about 2 or 3 cm below the left costal margin.  Extremities:  Show some trace edema in her legs.  Neurological:  No focal neurological deficits.  LABORATORY STUDIES:  Pending.  IMPRESSION:  Ms. is a 72 year old African American female with hemoglobin [place] disease.  She has anemia secondary to renal insufficiency. She also has chronic thrombocytopenia.  This is been asymptomatic.  For now, we will go ahead and just give her Procrit.  This does seem to help her quite a bit.  With the holidays coming up, she wants to travel back up to [place].  I do not see a problem with  this.  We have continued to have her come back monthly for her lab checks.  I will plan to see her back myself, probably in about 2 or 3 months.    ______________________________ Josph Macho, M.D. PRE/MEDQ  D:  03/25/2013  T:  03/26/2013  Job:  [id number]

## 2013-04-23 ENCOUNTER — Other Ambulatory Visit: Payer: Self-pay

## 2013-05-14 ENCOUNTER — Encounter: Payer: Self-pay | Admitting: *Deleted
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
## 2013-07-24 ENCOUNTER — Other Ambulatory Visit: Payer: Self-pay

## 2015-06-22 ENCOUNTER — Telehealth: Payer: Self-pay | Admitting: Hematology & Oncology

## 2015-06-22 NOTE — Telephone Encounter (Signed)
Patient called and cx 06/23/15 apt and stated she will call back to resch

## 2015-06-23 ENCOUNTER — Ambulatory Visit: Payer: Medicare Other | Admitting: Hematology & Oncology

## 2015-06-23 ENCOUNTER — Other Ambulatory Visit: Payer: Medicare Other

## 2015-06-23 ENCOUNTER — Ambulatory Visit: Payer: Medicare Other

## 2015-07-29 ENCOUNTER — Encounter (HOSPITAL_BASED_OUTPATIENT_CLINIC_OR_DEPARTMENT_OTHER): Payer: Self-pay

## 2015-07-29 ENCOUNTER — Ambulatory Visit: Payer: Medicare Other

## 2015-07-29 ENCOUNTER — Other Ambulatory Visit (HOSPITAL_BASED_OUTPATIENT_CLINIC_OR_DEPARTMENT_OTHER): Payer: Medicare Other

## 2015-07-29 ENCOUNTER — Ambulatory Visit (HOSPITAL_BASED_OUTPATIENT_CLINIC_OR_DEPARTMENT_OTHER): Payer: Medicare Other | Admitting: Hematology & Oncology

## 2015-07-29 ENCOUNTER — Other Ambulatory Visit: Payer: Self-pay | Admitting: *Deleted

## 2015-07-29 ENCOUNTER — Inpatient Hospital Stay (HOSPITAL_BASED_OUTPATIENT_CLINIC_OR_DEPARTMENT_OTHER)
Admission: EM | Admit: 2015-07-29 | Discharge: 2015-08-06 | DRG: 683 | Disposition: A | Discharge status: Skilled Nursing Facility | Payer: Medicare Other | Attending: Internal Medicine | Admitting: Internal Medicine

## 2015-07-29 VITALS — BP 134/65 | HR 99 | Temp 98.0°F | Resp 20 | Wt 197.0 lb

## 2015-07-29 DIAGNOSIS — D696 Thrombocytopenia, unspecified: Secondary | ICD-10-CM | POA: Diagnosis present

## 2015-07-29 DIAGNOSIS — Z87891 Personal history of nicotine dependence: Secondary | ICD-10-CM

## 2015-07-29 DIAGNOSIS — D5720 Sickle-cell/Hb-C disease without crisis: Secondary | ICD-10-CM

## 2015-07-29 DIAGNOSIS — K760 Fatty (change of) liver, not elsewhere classified: Secondary | ICD-10-CM | POA: Diagnosis present

## 2015-07-29 DIAGNOSIS — K7689 Other specified diseases of liver: Secondary | ICD-10-CM | POA: Diagnosis present

## 2015-07-29 DIAGNOSIS — J45909 Unspecified asthma, uncomplicated: Secondary | ICD-10-CM | POA: Diagnosis present

## 2015-07-29 DIAGNOSIS — D631 Anemia in chronic kidney disease: Secondary | ICD-10-CM | POA: Diagnosis present

## 2015-07-29 DIAGNOSIS — Z823 Family history of stroke: Secondary | ICD-10-CM

## 2015-07-29 DIAGNOSIS — N183 Chronic kidney disease, stage 3 unspecified: Secondary | ICD-10-CM | POA: Insufficient documentation

## 2015-07-29 DIAGNOSIS — E1165 Type 2 diabetes mellitus with hyperglycemia: Secondary | ICD-10-CM | POA: Diagnosis present

## 2015-07-29 DIAGNOSIS — D509 Iron deficiency anemia, unspecified: Secondary | ICD-10-CM | POA: Diagnosis present

## 2015-07-29 DIAGNOSIS — Z794 Long term (current) use of insulin: Secondary | ICD-10-CM | POA: Diagnosis not present

## 2015-07-29 DIAGNOSIS — I10 Essential (primary) hypertension: Secondary | ICD-10-CM | POA: Diagnosis not present

## 2015-07-29 DIAGNOSIS — E1122 Type 2 diabetes mellitus with diabetic chronic kidney disease: Secondary | ICD-10-CM | POA: Insufficient documentation

## 2015-07-29 DIAGNOSIS — Z9049 Acquired absence of other specified parts of digestive tract: Secondary | ICD-10-CM

## 2015-07-29 DIAGNOSIS — D649 Anemia, unspecified: Secondary | ICD-10-CM | POA: Diagnosis present

## 2015-07-29 DIAGNOSIS — R161 Splenomegaly, not elsewhere classified: Secondary | ICD-10-CM | POA: Diagnosis present

## 2015-07-29 DIAGNOSIS — E119 Type 2 diabetes mellitus without complications: Secondary | ICD-10-CM

## 2015-07-29 DIAGNOSIS — N184 Chronic kidney disease, stage 4 (severe): Secondary | ICD-10-CM | POA: Diagnosis present

## 2015-07-29 DIAGNOSIS — N189 Chronic kidney disease, unspecified: Secondary | ICD-10-CM

## 2015-07-29 DIAGNOSIS — N179 Acute kidney failure, unspecified: Secondary | ICD-10-CM | POA: Diagnosis present

## 2015-07-29 DIAGNOSIS — E872 Acidosis: Secondary | ICD-10-CM | POA: Diagnosis present

## 2015-07-29 DIAGNOSIS — Z79899 Other long term (current) drug therapy: Secondary | ICD-10-CM

## 2015-07-29 DIAGNOSIS — M109 Gout, unspecified: Secondary | ICD-10-CM | POA: Diagnosis present

## 2015-07-29 DIAGNOSIS — I129 Hypertensive chronic kidney disease with stage 1 through stage 4 chronic kidney disease, or unspecified chronic kidney disease: Principal | ICD-10-CM | POA: Diagnosis present

## 2015-07-29 DIAGNOSIS — N289 Disorder of kidney and ureter, unspecified: Secondary | ICD-10-CM | POA: Diagnosis not present

## 2015-07-29 HISTORY — DX: Encounter for other specified aftercare: Z51.89

## 2015-07-29 LAB — COMPREHENSIVE METABOLIC PANEL
ALBUMIN: 4.3 g/dL (ref 3.5–5.0)
ALT: 16 U/L (ref 14–54)
AST: 42 U/L — AB (ref 15–41)
Alkaline Phosphatase: 98 U/L (ref 38–126)
Anion gap: 8 (ref 5–15)
BUN: 48 mg/dL — AB (ref 6–20)
CHLORIDE: 115 mmol/L — AB (ref 101–111)
CO2: 15 mmol/L — ABNORMAL LOW (ref 22–32)
CREATININE: 2.36 mg/dL — AB (ref 0.44–1.00)
Calcium: 8.5 mg/dL — ABNORMAL LOW (ref 8.9–10.3)
GFR calc Af Amer: 23 mL/min — ABNORMAL LOW (ref >60)
GFR, EST NON AFRICAN AMERICAN: 19 mL/min — AB (ref >60)
GLUCOSE: 231 mg/dL — AB (ref 65–99)
Potassium: 4.5 mmol/L (ref 3.5–5.1)
Sodium: 138 mmol/L (ref 135–145)
Total Bilirubin: 1.4 mg/dL — ABNORMAL HIGH (ref 0.3–1.2)
Total Protein: 7.6 g/dL (ref 6.5–8.1)

## 2015-07-29 LAB — CBC WITH DIFFERENTIAL (CANCER CENTER ONLY)
BASO#: 0.1 10*3/uL (ref 0.0–0.2)
BASO%: 0.8 % (ref 0.0–2.0)
EOS%: 1.6 % (ref 0.0–7.0)
Eosinophils Absolute: 0.2 10*3/uL (ref 0.0–0.5)
HCT: 15.5 % — ABNORMAL LOW (ref 34.8–46.6)
HEMOGLOBIN: 5.1 g/dL — AB (ref 11.6–15.9)
LYMPH#: 4.7 10*3/uL — ABNORMAL HIGH (ref 0.9–3.3)
LYMPH%: 32.2 % (ref 14.0–48.0)
MCH: 22 pg — AB (ref 26.0–34.0)
MCHC: 32.9 g/dL (ref 32.0–36.0)
MCV: 67 fL — ABNORMAL LOW (ref 81–101)
MONO#: 0.8 10*3/uL (ref 0.1–0.9)
MONO%: 5.7 % (ref 0.0–13.0)
NEUT%: 59.7 % (ref 39.6–80.0)
NEUTROS ABS: 8.7 10*3/uL — AB (ref 1.5–6.5)
PLATELETS: 51 10*3/uL — AB (ref 145–400)
RBC: 2.32 10*6/uL — AB (ref 3.70–5.32)
RDW: 23.8 % — ABNORMAL HIGH (ref 11.1–15.7)

## 2015-07-29 LAB — CBC WITH DIFFERENTIAL/PLATELET
BASOS PCT: 0 %
Basophils Absolute: 0 10*3/uL (ref 0.0–0.1)
EOS PCT: 5 %
Eosinophils Absolute: 0.6 10*3/uL (ref 0.0–0.7)
HEMATOCRIT: 16.2 % — AB (ref 36.0–46.0)
HEMOGLOBIN: 5.2 g/dL — AB (ref 12.0–15.0)
Lymphocytes Relative: 39 %
Lymphs Abs: 4.8 10*3/uL — ABNORMAL HIGH (ref 0.7–4.0)
MCH: 21.3 pg — ABNORMAL LOW (ref 26.0–34.0)
MCHC: 32.1 g/dL (ref 30.0–36.0)
MCV: 66.4 fL — AB (ref 78.0–100.0)
MYELOCYTES: 2 %
Monocytes Absolute: 0.1 10*3/uL (ref 0.1–1.0)
Monocytes Relative: 1 %
NEUTROS ABS: 6.8 10*3/uL (ref 1.7–7.7)
NRBC: 17 /100{WBCs} — AB
Neutrophils Relative %: 53 %
Platelets: 63 10*3/uL — ABNORMAL LOW (ref 150–400)
RBC: 2.44 MIL/uL — AB (ref 3.87–5.11)
RDW: 23.8 % — ABNORMAL HIGH (ref 11.5–15.5)
WBC: 12.3 10*3/uL — AB (ref 4.0–10.5)

## 2015-07-29 LAB — FERRITIN: Ferritin: 822 ng/mL — ABNORMAL HIGH (ref 11–307)

## 2015-07-29 LAB — IRON AND TIBC
IRON: 62 ug/dL (ref 28–170)
SATURATION RATIOS: 20 % (ref 10.4–31.8)
TIBC: 305 ug/dL (ref 250–450)
UIBC: 243 ug/dL

## 2015-07-29 LAB — PROTIME-INR
INR: 1.3 (ref 0.00–1.49)
PROTHROMBIN TIME: 16.3 s — AB (ref 11.6–15.2)

## 2015-07-29 LAB — RETICULOCYTES
RBC.: 2.38 MIL/uL — ABNORMAL LOW (ref 3.87–5.11)
RETIC CT PCT: 6.9 % — AB (ref 0.4–3.1)
Retic Count, Absolute: 164.2 10*3/uL (ref 19.0–186.0)

## 2015-07-29 LAB — APTT: aPTT: 29 seconds (ref 24–37)

## 2015-07-29 LAB — TECHNOLOGIST REVIEW CHCC SATELLITE: Tech Review: 16

## 2015-07-29 MED ORDER — INSULIN ASPART 100 UNIT/ML ~~LOC~~ SOLN
0.0000 [IU] | Freq: Three times a day (TID) | SUBCUTANEOUS | Status: DC
  Administered 2015-07-30: 5 [IU] via SUBCUTANEOUS
  Administered 2015-07-30: 3 [IU] via SUBCUTANEOUS
  Administered 2015-07-30: 5 [IU] via SUBCUTANEOUS
  Administered 2015-07-31: 8 [IU] via SUBCUTANEOUS
  Administered 2015-07-31: 3 [IU] via SUBCUTANEOUS
  Administered 2015-07-31: 5 [IU] via SUBCUTANEOUS
  Administered 2015-08-01: 3 [IU] via SUBCUTANEOUS
  Administered 2015-08-01: 8 [IU] via SUBCUTANEOUS
  Administered 2015-08-01: 3 [IU] via SUBCUTANEOUS
  Administered 2015-08-02: 5 [IU] via SUBCUTANEOUS
  Administered 2015-08-02 – 2015-08-03 (×5): 3 [IU] via SUBCUTANEOUS
  Administered 2015-08-04: 5 [IU] via SUBCUTANEOUS
  Administered 2015-08-04 – 2015-08-05 (×4): 3 [IU] via SUBCUTANEOUS
  Administered 2015-08-05: 5 [IU] via SUBCUTANEOUS
  Administered 2015-08-06 (×2): 2 [IU] via SUBCUTANEOUS

## 2015-07-29 MED ORDER — ACETAMINOPHEN 650 MG RE SUPP: 650.0000 mg | Freq: Four times a day (QID) | RECTAL | Status: DC | PRN

## 2015-07-29 MED ORDER — INSULIN ASPART 100 UNIT/ML ~~LOC~~ SOLN
0.0000 [IU] | Freq: Every day | SUBCUTANEOUS | Status: DC
  Administered 2015-07-30 (×2): 2 [IU] via SUBCUTANEOUS
  Administered 2015-08-02: 3 [IU] via SUBCUTANEOUS
  Administered 2015-08-03: 2 [IU] via SUBCUTANEOUS

## 2015-07-29 MED ORDER — SODIUM CHLORIDE 0.9 % IV SOLN
Freq: Once | INTRAVENOUS | Status: AC
  Administered 2015-07-30: 01:00:00 via INTRAVENOUS

## 2015-07-29 MED ORDER — SODIUM CHLORIDE 0.9 % IV BOLUS (SEPSIS)
500.0000 mL | Freq: Once | INTRAVENOUS | Status: AC
  Administered 2015-07-29: 500 mL via INTRAVENOUS

## 2015-07-29 MED ORDER — ACETAMINOPHEN 325 MG PO TABS: 650.0000 mg | ORAL_TABLET | Freq: Four times a day (QID) | ORAL | Status: DC | PRN

## 2015-07-29 NOTE — ED Provider Notes (Signed)
CSN: [id number]     Arrival date & time 07/29/15  1702 History   First MD Initiated Contact with Patient 07/29/15 1717     Chief Complaint  Patient presents with  . Anemia     (Consider location/radiation/quality/duration/timing/severity/associated sxs/prior Treatment) Patient is a 74 y.o. female presenting with anemia. The history is provided by the patient.  Anemia This is a chronic problem. Pertinent negatives include no chest pain, no abdominal pain, no headaches and no shortness of breath.   patient presents transferred from the hematology office. Transit for anemia. She's been fatigued. Has a history hemoglobin [place] and chronic anemia. The knee was thought to be due to her renal insufficiency. She had been getting Procrit contractions but has not had one in a while. She had been living in [place] now is back in [place]. Found to have a hemoglobin of 5. Also has thrombocytopenia which is also not new for her. Has a baseline renal insufficiency. Guaiac negative at Dr. Antonieta Pert office.  no chest pain. She states she's been falling asleep a lot. She is diabetic. States she has been on her medicines.  Past Medical History  Diagnosis Date  . Hypertension   . Sickle cell disease ([place])   . Diabetes mellitus   . Anemia, chronic renal failure 08/04/2011  . Sickle cell disease, type [place] ([place]) 08/04/2011  . Blood transfusion without reported diagnosis    Past Surgical History  Procedure Laterality Date  . Cholecystectomy    . Shoulder surgery    . Appendectomy    . Knee surgery     No family history on file. Social History  Substance Use Topics  . Smoking status: Former Research scientist (life sciences)  . Smokeless tobacco: None  . Alcohol Use: No   OB History    No data available     Review of Systems  Constitutional: Positive for fatigue. Negative for activity change and appetite change.  Eyes: Negative for pain.  Respiratory: Negative for shortness of breath.   Cardiovascular: Negative for  chest pain and leg swelling.  Gastrointestinal: Negative for nausea, vomiting, abdominal pain and diarrhea.  Genitourinary: Negative for flank pain.  Musculoskeletal: Negative for back pain and neck stiffness.  Skin: Positive for pallor. Negative for rash.  Neurological: Negative for weakness, numbness and headaches.  Hematological: Does not bruise/bleed easily.  Psychiatric/Behavioral: Negative for behavioral problems.      Allergies  Codeine; Heparin cross reactors; Iohexol; Ivp dye; and Keflex  Home Medications   Prior to Admission medications   Medication Sig Start Date End Date Taking? Authorizing Provider  albuterol (PROVENTIL HFA;VENTOLIN HFA) 108 (90 BASE) MCG/ACT inhaler Inhale 2 puffs into the lungs every 6 (six) hours as needed.     Historical Provider, MD  cetirizine (ZYRTEC) 10 MG tablet Take 10 mg by mouth daily.     Historical Provider, MD  FOLIC ACID PO Take 1 mg by mouth daily.     Historical Provider, MD  hydrochlorothiazide (MICROZIDE) 12.5 MG capsule Take 12.5 mg by mouth daily.    Historical Provider, MD  insulin aspart protamine- aspart (NOVOLOG MIX 70/30) (70-30) 100 UNIT/ML injection 33 Units 2 (two) times daily with a meal.    Historical Provider, MD  Metoprolol Succinate (TOPROL XL PO) Take 100 mg by mouth daily.     Historical Provider, MD  montelukast (SINGULAIR) 10 MG tablet Take 10 mg by mouth every morning.    Historical Provider, MD  omeprazole (PRILOSEC) 10 MG capsule Take 10 mg by  mouth daily.    Historical Provider, MD  oxycodone (OXY-IR) 5 MG capsule Take 5 mg by mouth every 4 (four) hours as needed.    Historical Provider, MD  RAMIPRIL PO Take 10 mg by mouth 2 (two) times daily.     Historical Provider, MD  SERTRALINE HCL PO Take 100 mg by mouth daily. ZOLOFT    Historical Provider, MD   BP 125/68 mmHg  Pulse 101  Temp(Src) 97.6 F (36.4 C) (Oral)  Resp 18  Ht 5\' 5"  (1.651 m)  Wt 198 lb 8 oz (90.039 kg)  BMI 33.03 kg/m2  SpO2 99% Physical  Exam  Constitutional: She is oriented to person, place, and time. She appears well-developed and well-nourished.  HENT:  Head: Normocephalic and atraumatic.  Eyes: Pupils are equal, round, and reactive to light.  Neck: Normal range of motion. Neck supple.  Cardiovascular: Normal rate and regular rhythm.   Pulmonary/Chest: Effort normal and breath sounds normal. No respiratory distress. She has no wheezes. She has no rales.  Abdominal: Soft. Bowel sounds are normal. She exhibits mass. She exhibits no distension. There is no tenderness. There is no rebound and no guarding.  Left upper quadrant fullness  Musculoskeletal: Normal range of motion. She exhibits no edema.  Neurological: She is alert and oriented to person, place, and time. No cranial nerve deficit.  Skin: Skin is warm and dry. There is pallor.  Psychiatric: Her speech is normal.  Nursing note and vitals reviewed.   ED Course  Procedures (including critical care time) Labs Review Labs Reviewed  CBC WITH DIFFERENTIAL/PLATELET - Abnormal; Notable for the following:    WBC 12.3 (*)    RBC 2.44 (*)    Hemoglobin 5.2 (*)    HCT 16.2 (*)    MCV 66.4 (*)    MCH 21.3 (*)    RDW 23.8 (*)    Platelets 63 (*)    nRBC 17 (*)    Lymphs Abs 4.8 (*)    All other components within normal limits  COMPREHENSIVE METABOLIC PANEL - Abnormal; Notable for the following:    Chloride 115 (*)    CO2 15 (*)    Glucose, Bld 231 (*)    BUN 48 (*)    Creatinine, Ser 2.36 (*)    Calcium 8.5 (*)    AST 42 (*)    Total Bilirubin 1.4 (*)    GFR calc non Af Amer 19 (*)    GFR calc Af Amer 23 (*)    All other components within normal limits  PROTIME-INR - Abnormal; Notable for the following:    Prothrombin Time 16.3 (*)    All other components within normal limits  FERRITIN - Abnormal; Notable for the following:    Ferritin 822 (*)    All other components within normal limits  RETICULOCYTES - Abnormal; Notable for the following:    Retic Ct  Pct 6.9 (*)    RBC. 2.38 (*)    All other components within normal limits  APTT  IRON AND TIBC    Imaging Review No results found. I have personally reviewed and evaluated these images and lab results as part of my medical decision-making.   EKG Interpretation None      MDM   Final diagnoses:  Anemia, unspecified anemia type  Thrombocytopenia (HCC)  Renal insufficiency  Sickle cell disease, type [place], without crisis ([place])    Patient with anemia. History same. No active bleeding. Discuss with oncology hematology and will admit  to [facility]. Creatinine has increased.     Davonna Belling, MD 07/29/15 [phone]

## 2015-07-29 NOTE — Progress Notes (Signed)
Transfer requested for anemia, hgb 5.2  Telemetry admission  Rachel Palmer [facility] [id number]

## 2015-07-29 NOTE — ED Notes (Signed)
Reports weakness on Monday so came to have blood checked by PCP and hgb was 5.
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
## 2015-07-30 ENCOUNTER — Inpatient Hospital Stay (HOSPITAL_COMMUNITY): Payer: Medicare Other

## 2015-07-30 ENCOUNTER — Encounter (HOSPITAL_COMMUNITY): Payer: Self-pay | Admitting: Family Medicine

## 2015-07-30 DIAGNOSIS — E1165 Type 2 diabetes mellitus with hyperglycemia: Secondary | ICD-10-CM

## 2015-07-30 LAB — COMPREHENSIVE METABOLIC PANEL
ALK PHOS: 78 U/L (ref 38–126)
ALT: 12 U/L — AB (ref 14–54)
ANION GAP: 7 (ref 5–15)
AST: 29 U/L (ref 15–41)
Albumin: 3.6 g/dL (ref 3.5–5.0)
BUN: 41 mg/dL — ABNORMAL HIGH (ref 6–20)
CALCIUM: 8.1 mg/dL — AB (ref 8.9–10.3)
CHLORIDE: 116 mmol/L — AB (ref 101–111)
CO2: 16 mmol/L — ABNORMAL LOW (ref 22–32)
CREATININE: 2.2 mg/dL — AB (ref 0.44–1.00)
GFR, EST AFRICAN AMERICAN: 25 mL/min — AB (ref >60)
GFR, EST NON AFRICAN AMERICAN: 21 mL/min — AB (ref >60)
Glucose, Bld: 248 mg/dL — ABNORMAL HIGH (ref 65–99)
Potassium: 4.3 mmol/L (ref 3.5–5.1)
Sodium: 139 mmol/L (ref 135–145)
Total Bilirubin: 1.2 mg/dL (ref 0.3–1.2)
Total Protein: 6.5 g/dL (ref 6.5–8.1)

## 2015-07-30 LAB — GLUCOSE, CAPILLARY
GLUCOSE-CAPILLARY: 186 mg/dL — AB (ref 65–99)
GLUCOSE-CAPILLARY: 207 mg/dL — AB (ref 65–99)
GLUCOSE-CAPILLARY: 218 mg/dL — AB (ref 65–99)
GLUCOSE-CAPILLARY: 222 mg/dL — AB (ref 65–99)
GLUCOSE-CAPILLARY: 224 mg/dL — AB (ref 65–99)

## 2015-07-30 LAB — IRON AND TIBC CHCC
%SAT: 30 % (ref 21–57)
IRON: 74 ug/dL (ref 41–142)
TIBC: 244 ug/dL (ref 236–444)
UIBC: 170 ug/dL (ref 120–384)

## 2015-07-30 LAB — FERRITIN CHCC

## 2015-07-30 LAB — PREPARE RBC (CROSSMATCH)

## 2015-07-30 LAB — DIRECT ANTIGLOBULIN TEST (NOT AT ARMC)
DAT, IgG: NEGATIVE
DAT, complement: NEGATIVE

## 2015-07-30 MED ORDER — INSULIN GLARGINE 100 UNIT/ML ~~LOC~~ SOLN
25.0000 [IU] | Freq: Two times a day (BID) | SUBCUTANEOUS | Status: DC
  Administered 2015-07-30 – 2015-08-02 (×7): 25 [IU] via SUBCUTANEOUS
  Filled 2015-07-30 (×8): qty 0.25

## 2015-07-30 MED ORDER — FOLIC ACID 1 MG PO TABS
1.0000 mg | ORAL_TABLET | Freq: Every day | ORAL | Status: DC
  Administered 2015-07-30 – 2015-08-04 (×6): 1 mg via ORAL
  Filled 2015-07-30 (×6): qty 1

## 2015-07-30 MED ORDER — MONTELUKAST SODIUM 10 MG PO TABS
10.0000 mg | ORAL_TABLET | Freq: Every morning | ORAL | Status: DC
  Administered 2015-07-30 – 2015-08-06 (×8): 10 mg via ORAL
  Filled 2015-07-30 (×8): qty 1

## 2015-07-30 MED ORDER — VITAMIN D (ERGOCALCIFEROL) 1.25 MG (50000 UNIT) PO CAPS
50000.0000 [IU] | ORAL_CAPSULE | ORAL | Status: DC
  Administered 2015-08-03: 50000 [IU] via ORAL
  Filled 2015-07-30: qty 1

## 2015-07-30 MED ORDER — METOPROLOL SUCCINATE ER 100 MG PO TB24
100.0000 mg | ORAL_TABLET | Freq: Every day | ORAL | Status: DC
  Administered 2015-07-30 – 2015-08-06 (×8): 100 mg via ORAL
  Filled 2015-07-30 (×8): qty 1

## 2015-07-30 MED ORDER — SERTRALINE HCL 100 MG PO TABS
100.0000 mg | ORAL_TABLET | Freq: Every day | ORAL | Status: DC
  Administered 2015-07-30 – 2015-08-06 (×8): 100 mg via ORAL
  Filled 2015-07-30 (×8): qty 1

## 2015-07-30 MED ORDER — EPOETIN ALFA 20000 UNIT/ML IJ SOLN
40000.0000 [IU] | Freq: Once | INTRAMUSCULAR | Status: AC
  Administered 2015-07-30: 40000 [IU] via SUBCUTANEOUS
  Filled 2015-07-30: qty 1

## 2015-07-30 MED ORDER — PANTOPRAZOLE SODIUM 40 MG PO TBEC
40.0000 mg | DELAYED_RELEASE_TABLET | Freq: Every day | ORAL | Status: DC
  Administered 2015-07-30 – 2015-08-06 (×8): 40 mg via ORAL
  Filled 2015-07-30 (×8): qty 1

## 2015-07-30 MED ORDER — HYDROCHLOROTHIAZIDE 12.5 MG PO CAPS
12.5000 mg | ORAL_CAPSULE | Freq: Every day | ORAL | Status: DC
  Administered 2015-07-30 – 2015-08-04 (×6): 12.5 mg via ORAL
  Filled 2015-07-30 (×6): qty 1

## 2015-07-30 MED ORDER — MOMETASONE FURO-FORMOTEROL FUM 200-5 MCG/ACT IN AERO
2.0000 | INHALATION_SPRAY | Freq: Two times a day (BID) | RESPIRATORY_TRACT | Status: DC
  Administered 2015-07-30 – 2015-08-06 (×14): 2 via RESPIRATORY_TRACT
  Filled 2015-07-30: qty 8.8

## 2015-07-30 MED ORDER — RAMIPRIL 10 MG PO CAPS: 10.0000 mg | ORAL_CAPSULE | Freq: Two times a day (BID) | ORAL | Status: DC

## 2015-07-30 MED ORDER — OXYCODONE HCL 5 MG PO TABS
5.0000 mg | ORAL_TABLET | ORAL | Status: DC | PRN
  Administered 2015-08-02 – 2015-08-04 (×2): 5 mg via ORAL
  Filled 2015-07-30 (×2): qty 1

## 2015-07-30 NOTE — Progress Notes (Signed)
[facility] PROGRESS NOTE  RENATE KLIMEK [id number] DOB: September 02, 1941 DOA: 07/29/2015 PCP: Maylon Peppers, MD  Assessment/Plan: Principal Problem:   Anemia in chronic kidney disease - awaiting transfusion - difficult to transfuse because of multiple antibodies - Patient given Procrit dose  Active Problems:   Sickle cell disease, type [place] ([place]) - hematology on board.   Chronic kidney disease (CKD), stage IV (severe) (HCC) - stable and S creatinine trending down   Type 2 diabetes mellitus ([place]) - continue carb modified diet, lantus, ssi.   Enlargement of spleen   Thrombocytopenia ([place])  Code Status: full Family Communication: none at bedside. Disposition Plan: Pending improvement in hgb levels   Consultants:  Oncology/hematology  Procedures:  none  Antibiotics:  none   HPI/Subjective: Pt has no new complaints. Looking forward to transfusion  Objective: Filed Vitals:   07/30/15 1330  BP: 130/54  Pulse: 101  Temp: 98.2 F (36.8 C)  Resp: 20    Intake/Output Summary (Last 24 hours) at 07/30/15 1335 Last data filed at 07/30/15 1300  Gross per 24 hour  Intake    360 ml  Output    600 ml  Net   -240 ml   Filed Weights   07/29/15 1707 07/29/15 2255  Weight: 89.359 kg (197 lb) 90.039 kg (198 lb 8 oz)    Exam:   General:  Pt in nad, alert and awake3  Cardiovascular: rrr, no rubs  Respiratory: no increased wob, equal chest rise  Abdomen: soft, nd, nt  Musculoskeletal: equal tone BL   Data Reviewed: Basic Metabolic Panel:  Recent Labs Lab 07/29/15 1459 07/29/15 1725 07/30/15 0430  NA 135 138 139  K 4.5 4.5 4.3  CL 110 115* 116*  CO2 16* 15* 16*  GLUCOSE 325* 231* 248*  BUN 48* 48* 41*  CREATININE 2.27* 2.36* 2.20*  CALCIUM 8.4* 8.5* 8.1*   Liver Function Tests:  Recent Labs Lab 07/29/15 1459 07/29/15 1725 07/30/15 0430  AST 35 42* 29  ALT 12 16 12*  ALKPHOS 100 98 78  BILITOT 1.2 1.4* 1.2  PROT 7.3 7.6 6.5  ALBUMIN  4.4 4.3 3.6   No results for input(s): LIPASE, AMYLASE in the last 168 hours. No results for input(s): AMMONIA in the last 168 hours. CBC:  Recent Labs Lab 07/29/15 1459 07/29/15 1725  WBC 12.6 Corrected for nRBC* 12.3*  NEUTROABS 8.7* 6.8  HGB 5.1* 5.2*  HCT 15.5* 16.2*  MCV 67* 66.4*  PLT 51* 63*   Cardiac Enzymes: No results for input(s): CKTOTAL, CKMB, CKMBINDEX, TROPONINI in the last 168 hours. BNP (last 3 results) No results for input(s): BNP in the last 8760 hours.  ProBNP (last 3 results) No results for input(s): PROBNP in the last 8760 hours.  CBG:  Recent Labs Lab 07/30/15 0008 07/30/15 0725 07/30/15 1139  GLUCAP 224* 186* 222*    No results found for this or any previous visit (from the past 240 hour(s)).   Studies: US Abdomen Complete  07/30/2015  CLINICAL DATA:  Splenomegaly EXAM: ULTRASOUND ABDOMEN COMPLETE COMPARISON:  Abdominal ultrasound dated 06/22/2011. CT abdomen pelvis dated 02/04/2010. FINDINGS: Gallbladder: Surgically absent. Common bile duct: Diameter: 4 mm Liver: Hyperechoic hepatic parenchyma, suggesting hepatic steatosis. 2.0 x 1.9 x 2.0 cm complex/septated probable cyst in the lateral segment left hepatic lobe, previously 1.5 cm in 2011. Additional 1.3 x 1.0 x 0.9 cm simple cyst. IVC: No abnormality visualized. Pancreas: Visualized portion unremarkable. Spleen: Enlarged, measuring 15.5 x 16.5 x 6.5 cm (calculated volume  869 mL), decreased (previously calculated volume 1215 mL). Right Kidney: Length: 9.6 cm. Echogenic renal parenchyma with cortical thinning/ atrophy. No hydronephrosis. Left Kidney: Length: 9.5 cm. Echogenic renal parenchyma with cortical thinning/ atrophy. No hydronephrosis. Abdominal aorta: No aneurysm visualized. Other findings: None. IMPRESSION: Hepatic steatosis. Two probable hepatic cysts in the left hepatic lobe, one of which is complex/ septated and mildly increased from 2011, but still likely benign. Splenomegaly, measuring  up to 16.5 cm (calculated volume 869 mL), decreased from 2012. Status post cholecystectomy. Bilateral renal atrophy with suspected medical renal disease. No hydronephrosis. Electronically Signed   By: Julian Hy M.D.   On: 07/30/2015 08:20    Scheduled Meds: . folic acid  1 mg Oral Daily  . hydrochlorothiazide  12.5 mg Oral Daily  . insulin aspart  0-15 Units Subcutaneous TID WC  . insulin aspart  0-5 Units Subcutaneous QHS  . insulin glargine  25 Units Subcutaneous BID  . metoprolol succinate  100 mg Oral Daily  . mometasone-formoterol  2 puff Inhalation BID  . montelukast  10 mg Oral q morning - 10a  . pantoprazole  40 mg Oral Daily  . sertraline  100 mg Oral Daily  . [START ON 08/03/2015] Vitamin D (Ergocalciferol)  50,000 Units Oral Weekly   Continuous Infusions:    Time spent: > 20 minutes    [name]  [facility] Pager [phone] If 7PM-7AM, please contact night-coverage at [email or web address], password [facility] 07/30/2015, 1:35 PM  LOS: 1 day
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
## 2015-07-30 NOTE — Progress Notes (Signed)
Received a call from blood bank informing  This RN  that  As of now  no blood is  Available and  That they cannot find any unit compatible/matches  with the patient.  [facility] " might " have it on Monday after their weekend collection , but still not sure if it will be available . Dr. Wendee Beavers paged made aware.
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
## 2015-07-30 NOTE — Progress Notes (Signed)
I followed up paient's requisition for blood at Blood bank. Blood is not yet available. According to the lab, patient has a lot of antibodies that they have to request it from outside. Dr. Marin Olp was made aware.

## 2015-07-31 LAB — GLUCOSE, CAPILLARY
GLUCOSE-CAPILLARY: 190 mg/dL — AB (ref 65–99)
Glucose-Capillary: 180 mg/dL — ABNORMAL HIGH (ref 65–99)
Glucose-Capillary: 256 mg/dL — ABNORMAL HIGH (ref 65–99)
Glucose-Capillary: 227 mg/dL — ABNORMAL HIGH (ref 65–99)

## 2015-07-31 LAB — ERYTHROPOIETIN: ERYTHROPOIETIN: 303.8 m[IU]/mL — AB (ref 2.6–18.5)

## 2015-07-31 MED ORDER — VITAMINS A & D EX OINT
TOPICAL_OINTMENT | CUTANEOUS | Status: AC
  Administered 2015-07-31: 5
  Filled 2015-07-31: qty 5

## 2015-07-31 NOTE — Progress Notes (Signed)
[facility] PROGRESS NOTE  Rachel Palmer [id number] DOB: Jan 05, 1941 DOA: 07/29/2015 PCP: Maylon Peppers, MD  Assessment/Plan: Principal Problem:   Anemia in chronic kidney disease - awaiting transfusion, difficult to transfuse because of multiple antibodies - Patient given Procrit dose  Active Problems:   Sickle cell disease, type [place] ([place]) - hematology on board.   Chronic kidney disease (CKD), stage IV (severe) ([facility]) - stable and S creatinine trending down   Type 2 diabetes mellitus ([place]) - continue carb modified diet, lantus, ssi.   Enlargement of spleen   Thrombocytopenia ([place])  Code Status: full Family Communication: none at bedside. Disposition Plan: Pending improvement in hgb levels   Consultants:  Oncology/hematology  Procedures:  none  Antibiotics:  none   HPI/Subjective: Pt has no new complaints. Still looking forward to transfusion  Objective: Filed Vitals:   07/31/15 1303  BP: 140/75  Pulse: 95  Temp: 97.9 F (36.6 C)  Resp: 16    Intake/Output Summary (Last 24 hours) at 07/31/15 1609 Last data filed at 07/31/15 1500  Gross per 24 hour  Intake    480 ml  Output   1700 ml  Net  -1220 ml   Filed Weights   07/29/15 1707 07/29/15 2255  Weight: 89.359 kg (197 lb) 90.039 kg (198 lb 8 oz)    Exam:   General:  Pt in nad, alert and awake3  Cardiovascular: rrr, no rubs  Respiratory: no increased wob, equal chest rise  Abdomen: soft, nd, nt  Musculoskeletal: equal tone BL   Data Reviewed: Basic Metabolic Panel:  Recent Labs Lab 07/29/15 1459 07/29/15 1725 07/30/15 0430  NA 135 138 139  K 4.5 4.5 4.3  CL 110 115* 116*  CO2 16* 15* 16*  GLUCOSE 325* 231* 248*  BUN 48* 48* 41*  CREATININE 2.27* 2.36* 2.20*  CALCIUM 8.4* 8.5* 8.1*   Liver Function Tests:  Recent Labs Lab 07/29/15 1459 07/29/15 1725 07/30/15 0430  AST 35 42* 29  ALT 12 16 12*  ALKPHOS 100 98 78  BILITOT 1.2 1.4* 1.2  PROT 7.3 7.6 6.5  ALBUMIN  4.4 4.3 3.6   No results for input(s): LIPASE, AMYLASE in the last 168 hours. No results for input(s): AMMONIA in the last 168 hours. CBC:  Recent Labs Lab 07/29/15 1459 07/29/15 1725  WBC 12.6 Corrected for nRBC* 12.3*  NEUTROABS 8.7* 6.8  HGB 5.1* 5.2*  HCT 15.5* 16.2*  MCV 67* 66.4*  PLT 51* 63*   Cardiac Enzymes: No results for input(s): CKTOTAL, CKMB, CKMBINDEX, TROPONINI in the last 168 hours. BNP (last 3 results) No results for input(s): BNP in the last 8760 hours.  ProBNP (last 3 results) No results for input(s): PROBNP in the last 8760 hours.  CBG:  Recent Labs Lab 07/30/15 1139 07/30/15 1611 07/30/15 2127 07/31/15 0807 07/31/15 1244  GLUCAP 222* 207* 218* 180* 256*    No results found for this or any previous visit (from the past 240 hour(s)).   Studies: US Abdomen Complete  07/30/2015  CLINICAL DATA:  Splenomegaly EXAM: ULTRASOUND ABDOMEN COMPLETE COMPARISON:  Abdominal ultrasound dated 06/22/2011. CT abdomen pelvis dated 02/04/2010. FINDINGS: Gallbladder: Surgically absent. Common bile duct: Diameter: 4 mm Liver: Hyperechoic hepatic parenchyma, suggesting hepatic steatosis. 2.0 x 1.9 x 2.0 cm complex/septated probable cyst in the lateral segment left hepatic lobe, previously 1.5 cm in 2011. Additional 1.3 x 1.0 x 0.9 cm simple cyst. IVC: No abnormality visualized. Pancreas: Visualized portion unremarkable. Spleen: Enlarged, measuring 15.5 x 16.5 x  6.5 cm (calculated volume 869 mL), decreased (previously calculated volume 1215 mL). Right Kidney: Length: 9.6 cm. Echogenic renal parenchyma with cortical thinning/ atrophy. No hydronephrosis. Left Kidney: Length: 9.5 cm. Echogenic renal parenchyma with cortical thinning/ atrophy. No hydronephrosis. Abdominal aorta: No aneurysm visualized. Other findings: None. IMPRESSION: Hepatic steatosis. Two probable hepatic cysts in the left hepatic lobe, one of which is complex/ septated and mildly increased from 2011, but still  likely benign. Splenomegaly, measuring up to 16.5 cm (calculated volume 869 mL), decreased from 2012. Status post cholecystectomy. Bilateral renal atrophy with suspected medical renal disease. No hydronephrosis. Electronically Signed   By: Julian Hy M.D.   On: 07/30/2015 08:20    Scheduled Meds: . folic acid  1 mg Oral Daily  . hydrochlorothiazide  12.5 mg Oral Daily  . insulin aspart  0-15 Units Subcutaneous TID WC  . insulin aspart  0-5 Units Subcutaneous QHS  . insulin glargine  25 Units Subcutaneous BID  . metoprolol succinate  100 mg Oral Daily  . mometasone-formoterol  2 puff Inhalation BID  . montelukast  10 mg Oral q morning - 10a  . pantoprazole  40 mg Oral Daily  . sertraline  100 mg Oral Daily  . [START ON 08/03/2015] Vitamin D (Ergocalciferol)  50,000 Units Oral Weekly   Continuous Infusions:    Time spent: > 20 minutes    [name]  [facility] Pager [phone] If 7PM-7AM, please contact night-coverage at [email or web address], password [facility] 07/31/2015, 4:09 PM  LOS: 2 days

## 2015-08-01 LAB — GLUCOSE, CAPILLARY
GLUCOSE-CAPILLARY: 173 mg/dL — AB (ref 65–99)
GLUCOSE-CAPILLARY: 277 mg/dL — AB (ref 65–99)
GLUCOSE-CAPILLARY: 198 mg/dL — AB (ref 65–99)
Glucose-Capillary: 182 mg/dL — ABNORMAL HIGH (ref 65–99)

## 2015-08-01 MED ORDER — VITAMINS A & D EX OINT
TOPICAL_OINTMENT | CUTANEOUS | Status: DC | PRN
  Administered 2015-08-02: 5 via TOPICAL
  Filled 2015-08-01: qty 5

## 2015-08-01 NOTE — Progress Notes (Signed)
RN notified the PCP on call that the patient would like A&D ointment. Will continue to monitor the patient.

## 2015-08-01 NOTE — Progress Notes (Addendum)
Patient concerned with her CBGs being elevated and that she is not receiving adequate coverage from her SSI doses and requested to speak with her attending, Dr Wendee Beavers paged and returned call, was updated on pt's concerns and he stated to tell her that he can not come to unit and see her this evening, that he is fine with current report of what her CBGs have been running and that he will speak with her about it in the morning   Pt updated and is not pleased with md's response RE: her CBGs

## 2015-08-01 NOTE — Progress Notes (Signed)
[facility] PROGRESS NOTE  Rachel Palmer [id number] DOB: 1941-06-15 DOA: 07/29/2015 PCP: Maylon Peppers, MD  Assessment/Plan: Principal Problem:   Anemia in chronic kidney disease - still awaiting transfusion, difficult to transfuse because of multiple antibodies - Patient given Procrit dose - Hematology on board  Active Problems:   Sickle cell disease, type [place] ([place]) - hematology on board.    Chronic kidney disease (CKD), stage IV (severe) ([facility]) - stable and S creatinine trending down    Type 2 diabetes mellitus ([place]) - continue carb modified diet, lantus, ssi.    Enlargement of spleen   Thrombocytopenia ([place])  Code Status: full Family Communication: none at bedside. Disposition Plan: Pending improvement in hgb levels   Consultants:  Oncology/hematology  Procedures:  none  Antibiotics:  none   HPI/Subjective: Pt has no new complaints. Still looking forward to transfusion  Objective: Filed Vitals:   08/01/15 1322  BP: 133/66  Pulse: 89  Temp: 98.4 F (36.9 C)  Resp: 12    Intake/Output Summary (Last 24 hours) at 08/01/15 1459 Last data filed at 08/01/15 0930  Gross per 24 hour  Intake    720 ml  Output   1450 ml  Net   -730 ml   Filed Weights   07/29/15 1707 07/29/15 2255  Weight: 89.359 kg (197 lb) 90.039 kg (198 lb 8 oz)    Exam:   General:  Pt in nad, alert and awake3  Cardiovascular: rrr, no rubs  Respiratory: no increased wob, equal chest rise  Abdomen: soft, nd, nt  Musculoskeletal: equal tone BL   Data Reviewed: Basic Metabolic Panel:  Recent Labs Lab 07/29/15 1459 07/29/15 1725 07/30/15 0430  NA 135 138 139  K 4.5 4.5 4.3  CL 110 115* 116*  CO2 16* 15* 16*  GLUCOSE 325* 231* 248*  BUN 48* 48* 41*  CREATININE 2.27* 2.36* 2.20*  CALCIUM 8.4* 8.5* 8.1*   Liver Function Tests:  Recent Labs Lab 07/29/15 1459 07/29/15 1725 07/30/15 0430  AST 35 42* 29  ALT 12 16 12*  ALKPHOS 100 98 78  BILITOT 1.2  1.4* 1.2  PROT 7.3 7.6 6.5  ALBUMIN 4.4 4.3 3.6   No results for input(s): LIPASE, AMYLASE in the last 168 hours. No results for input(s): AMMONIA in the last 168 hours. CBC:  Recent Labs Lab 07/29/15 1459 07/29/15 1725  WBC 12.6 Corrected for nRBC* 12.3*  NEUTROABS 8.7* 6.8  HGB 5.1* 5.2*  HCT 15.5* 16.2*  MCV 67* 66.4*  PLT 51* 63*   Cardiac Enzymes: No results for input(s): CKTOTAL, CKMB, CKMBINDEX, TROPONINI in the last 168 hours. BNP (last 3 results) No results for input(s): BNP in the last 8760 hours.  ProBNP (last 3 results) No results for input(s): PROBNP in the last 8760 hours.  CBG:  Recent Labs Lab 07/31/15 1244 07/31/15 1642 07/31/15 2158 08/01/15 0826 08/01/15 1232  GLUCAP 256* 227* 190* 173* 277*    No results found for this or any previous visit (from the past 240 hour(s)).   Studies: No results found.  Scheduled Meds: . folic acid  1 mg Oral Daily  . hydrochlorothiazide  12.5 mg Oral Daily  . insulin aspart  0-15 Units Subcutaneous TID WC  . insulin aspart  0-5 Units Subcutaneous QHS  . insulin glargine  25 Units Subcutaneous BID  . metoprolol succinate  100 mg Oral Daily  . mometasone-formoterol  2 puff Inhalation BID  . montelukast  10 mg Oral q morning - 10a  .  pantoprazole  40 mg Oral Daily  . sertraline  100 mg Oral Daily  . [START ON 08/03/2015] Vitamin D (Ergocalciferol)  50,000 Units Oral Weekly   Continuous Infusions:    Time spent: > 20 minutes    [name]  [facility] Pager [phone] If 7PM-7AM, please contact night-coverage at [email or web address], password [facility] 08/01/2015, 2:59 PM  LOS: 3 days

## 2015-08-02 LAB — COMPREHENSIVE METABOLIC PANEL
ALT: 12 U/L (ref 6–29)
AST: 35 U/L (ref 10–35)
Albumin: 4.4 g/dL (ref 3.6–5.1)
Alkaline Phosphatase: 100 U/L (ref 33–130)
BUN: 48 mg/dL — AB (ref 7–25)
CHLORIDE: 110 mmol/L (ref 98–110)
CO2: 16 mmol/L — ABNORMAL LOW (ref 20–31)
CREATININE: 2.27 mg/dL — AB (ref 0.60–0.93)
Calcium: 8.4 mg/dL — ABNORMAL LOW (ref 8.6–10.4)
GLUCOSE: 325 mg/dL — AB (ref 65–99)
POTASSIUM: 4.5 mmol/L (ref 3.5–5.3)
SODIUM: 135 mmol/L (ref 135–146)
Total Bilirubin: 1.2 mg/dL (ref 0.2–1.2)
Total Protein: 7.3 g/dL (ref 6.1–8.1)

## 2015-08-02 LAB — GLUCOSE, CAPILLARY
GLUCOSE-CAPILLARY: 207 mg/dL — AB (ref 65–99)
GLUCOSE-CAPILLARY: 176 mg/dL — AB (ref 65–99)
Glucose-Capillary: 184 mg/dL — ABNORMAL HIGH (ref 65–99)
Glucose-Capillary: 264 mg/dL — ABNORMAL HIGH (ref 65–99)

## 2015-08-02 LAB — CBC
HEMATOCRIT: 14.4 % — AB (ref 36.0–46.0)
HEMOGLOBIN: 4.6 g/dL — AB (ref 12.0–15.0)
MCH: 21.9 pg — ABNORMAL LOW (ref 26.0–34.0)
MCHC: 31.9 g/dL (ref 30.0–36.0)
MCV: 68.6 fL — AB (ref 78.0–100.0)
Platelets: 64 10*3/uL — ABNORMAL LOW (ref 150–400)
RBC: 2.1 MIL/uL — ABNORMAL LOW (ref 3.87–5.11)
RDW: 26.4 % — AB (ref 11.5–15.5)
WBC: 10.7 10*3/uL — ABNORMAL HIGH (ref 4.0–10.5)

## 2015-08-02 LAB — RETICULOCYTES (CHCC)
ABS RETIC: 146.4 10*3/uL (ref 19.0–186.0)
RBC.: 2.44 MIL/uL — AB (ref 3.87–5.11)
Retic Ct Pct: 6 % — ABNORMAL HIGH (ref 0.4–2.3)

## 2015-08-02 LAB — HEMOGLOBINOPATHY EVALUATION
HEMOGLOBIN OTHER: 44 % — AB
HGB F QUANT: 2.8 % — AB (ref 0.0–2.0)
Hgb A2 Quant: 3.6 % — ABNORMAL HIGH (ref 2.2–3.2)
Hgb A: 0 % — ABNORMAL LOW (ref 96.8–97.8)
Hgb S Quant: 49.6 % — ABNORMAL HIGH

## 2015-08-02 LAB — HEMOGLOBIN A1C
HEMOGLOBIN A1C: 5.6 % (ref 4.8–5.6)
MEAN PLASMA GLUCOSE: 114 mg/dL

## 2015-08-02 MED ORDER — SODIUM CHLORIDE 0.9 % IV SOLN
510.0000 mg | Freq: Once | INTRAVENOUS | Status: AC
  Administered 2015-08-02: 510 mg via INTRAVENOUS
  Filled 2015-08-02: qty 17

## 2015-08-02 MED ORDER — INSULIN GLARGINE 100 UNIT/ML ~~LOC~~ SOLN
26.0000 [IU] | Freq: Two times a day (BID) | SUBCUTANEOUS | Status: DC
  Administered 2015-08-02 – 2015-08-06 (×8): 26 [IU] via SUBCUTANEOUS
  Filled 2015-08-02 (×9): qty 0.26

## 2015-08-02 NOTE — Care Management Note (Signed)
Case Management Note  Patient Details  Name: Rachel Palmer MRN: [id number] Date of Birth: Nov 12, 1940  Subjective/Objective:74 y/o f admittes w/anemia.DM,                     Action/Plan:d/c plan home.   Expected Discharge Date:                  Expected Discharge Plan:  Home/Self Care  In-House Referral:     Discharge planning Services  CM Consult  Post Acute Care Choice:    Choice offered to:     DME Arranged:    DME Agency:     HH Arranged:    HH Agency:     Status of Service:  In process, will continue to follow  Medicare Important Message Given:  Yes Date Medicare IM Given:    Medicare IM give by:    Date Additional Medicare IM Given:    Additional Medicare Important Message give by:     If discussed at [place] of Stay Meetings, dates discussed:    Additional Comments:  Dessa Phi, RN 08/02/2015, 3:24 PM

## 2015-08-02 NOTE — Progress Notes (Addendum)
[facility] PROGRESS NOTE  Rachel Palmer [id number] DOB: 04-Jul-1941 DOA: 07/29/2015 PCP: Maylon Peppers, MD  Assessment/Plan: Principal Problem:   Anemia in chronic kidney disease - still awaiting transfusion, difficult to transfuse because of multiple antibodies - Patient given Procrit dose - Hematology on board addendum: ordered IV iron replacement. Still awaiting transfusion due to difficult to match due to history of antibodies  Active Problems:   Sickle cell disease, type [place] ([place]) - hematology on board.    Chronic kidney disease (CKD), stage IV (severe) (HCC) - stable and S creatinine trending down    Type 2 diabetes mellitus (HCC) - continue carb modified diet, lantus, ssi. - Will increase lantus dose for improved blood sugar control    Enlargement of spleen   Thrombocytopenia (HCC)  Code Status: full Family Communication: none at bedside. Disposition Plan: Pending improvement in hgb levels   Consultants:  Oncology/hematology  Procedures:  none  Antibiotics:  none   HPI/Subjective: Pt has no new complaints. Still looking forward to transfusion  Objective: Filed Vitals:   08/02/15 1308  BP: 94/80  Pulse: 79  Temp: 98.2 F (36.8 C)  Resp: 17    Intake/Output Summary (Last 24 hours) at 08/02/15 1546 Last data filed at 08/02/15 1500  Gross per 24 hour  Intake    240 ml  Output   1850 ml  Net  -1610 ml   Filed Weights   07/29/15 1707 07/29/15 2255  Weight: 89.359 kg (197 lb) 90.039 kg (198 lb 8 oz)    Exam:   General:  Pt in nad, alert and awake3  Cardiovascular: rrr, no rubs  Respiratory: no increased wob, equal chest rise  Abdomen: soft, nd, nt  Musculoskeletal: equal tone BL   Data Reviewed: Basic Metabolic Panel:  Recent Labs Lab 07/29/15 1459 07/29/15 1725 07/30/15 0430  NA 135 138 139  K 4.5 4.5 4.3  CL 110 115* 116*  CO2 16* 15* 16*  GLUCOSE 325* 231* 248*  BUN 48* 48* 41*  CREATININE 2.27* 2.36* 2.20*   CALCIUM 8.4* 8.5* 8.1*   Liver Function Tests:  Recent Labs Lab 07/29/15 1459 07/29/15 1725 07/30/15 0430  AST 35 42* 29  ALT 12 16 12*  ALKPHOS 100 98 78  BILITOT 1.2 1.4* 1.2  PROT 7.3 7.6 6.5  ALBUMIN 4.4 4.3 3.6   No results for input(s): LIPASE, AMYLASE in the last 168 hours. No results for input(s): AMMONIA in the last 168 hours. CBC:  Recent Labs Lab 07/29/15 1459 07/29/15 1725 08/02/15 0500  WBC 12.6 Corrected for nRBC* 12.3* 10.7*  NEUTROABS 8.7* 6.8  --   HGB 5.1* 5.2* 4.6*  HCT 15.5* 16.2* 14.4*  MCV 67* 66.4* 68.6*  PLT 51* 63* 64*   Cardiac Enzymes: No results for input(s): CKTOTAL, CKMB, CKMBINDEX, TROPONINI in the last 168 hours. BNP (last 3 results) No results for input(s): BNP in the last 8760 hours.  ProBNP (last 3 results) No results for input(s): PROBNP in the last 8760 hours.  CBG:  Recent Labs Lab 08/01/15 1232 08/01/15 1649 08/01/15 2206 08/02/15 0748 08/02/15 1211  GLUCAP 277* 198* 182* 184* 207*    No results found for this or any previous visit (from the past 240 hour(s)).   Studies: No results found.  Scheduled Meds: . folic acid  1 mg Oral Daily  . hydrochlorothiazide  12.5 mg Oral Daily  . insulin aspart  0-15 Units Subcutaneous TID WC  . insulin aspart  0-5 Units Subcutaneous QHS  .  insulin glargine  26 Units Subcutaneous BID  . metoprolol succinate  100 mg Oral Daily  . mometasone-formoterol  2 puff Inhalation BID  . montelukast  10 mg Oral q morning - 10a  . pantoprazole  40 mg Oral Daily  . sertraline  100 mg Oral Daily  . [START ON 08/03/2015] Vitamin D (Ergocalciferol)  50,000 Units Oral Weekly   Continuous Infusions:    Time spent: > 20 minutes    [name]  [facility] Pager [phone] If 7PM-7AM, please contact night-coverage at [email or web address], password [facility] 08/02/2015, 3:46 PM  LOS: 4 days

## 2015-08-02 NOTE — Progress Notes (Addendum)
Received a call from [facility] of blood bank, stating that there is an  Available unit of [facility]BC [facility] with primary antibody except with GOA, and 2% unlikely to be positive. She wants [place] to clarify with MD if it is ok to transfuse. This RN called  with Dr. Zettie Pho office , spoke with his nurse Roselyn Reef, MD said [facility] to transfuse since patients HGB is very low.

## 2015-08-02 NOTE — Progress Notes (Signed)
She still has not yet been transfused. It is apparent that these antibodies that she has will make it very difficult to transfuse her. I really do not think this is from the sickle cell disease. Her splenomegaly actually is a little better.  She does have some iron deficiency. I'll give her a dose of IV iron.  She had a dose of Procrit on Friday.  Her blood sugars are still quite high. She needs to have better control of her blood sugars. Peripheral renal function is holding pretty stable.  She's had no fever. She's had no obvious bleeding. I did do a rectal exam on her in the office when I saw her last Thursday. The stool was negative for any bleeding.  When she gets up, she does feel a little dizzy. This is understandable given her degree of anemia.  We still may have to do a bone marrow biopsy on her. I would be surprised if she has any underlying bone marrow disorder but yet is hard to explain fully why she is so anemic.  On her physical exam, her vital signs are pretty stable. Her blood pressure is 112/45. Her pulses 77. Temperature 98.1. Her lungs sound pretty clear. I do not hear any wheezing. Heart exam regular rate and rhythm. She'll 1/6 systolic murmur. Abdomen is soft. Bowel sounds are slightly decreased. There is no obvious hepatomegaly. I really have hard time palpating her spleen tip. Extremities shows 1+ edema. Neurological exam is nonfocal.  Hopefully, she'll be able to get a transfusion today. She really needs the transfusion.  I'll give her iron.  Hopefully, her blood sugars will be under better control. It may not be a bad idea to check a hemoglobin A1c on her.  I appreciate all of the great care that she is getting. The staff up on [address] is doing a great job along with the hospitalist.  Lum Keas.  1 Chronicles 29:11

## 2015-08-02 NOTE — Progress Notes (Signed)
CRITICAL VALUE ALERT  Critical value received:  Hgb 4.6  Date of notification: 08/02/15  Time of notification:  0548  Critical value read back:yes  Nurse who received alert:  Dellie Catholic  MD notified (1st page):  yes  Time of first page: [phone]

## 2015-08-02 NOTE — Care Management Important Message (Signed)
Important Message  Patient Details  Name: Rachel Palmer MRN: [id number] Date of Birth: [birth year]   Medicare Important Message Given:  Yes    Camillo Flaming 08/02/2015, 12:05 [place] Message  Patient Details  Name: Rachel Palmer MRN: [id number] Date of Birth: October 01, 1940   Medicare Important Message Given:  Yes    Camillo Flaming 08/02/2015, 12:05 PM

## 2015-08-02 NOTE — Progress Notes (Signed)
Inpatient Diabetes Program Recommendations  AACE/ADA: New Consensus Statement on Inpatient Glycemic Control (2015)  Target Ranges:  Prepandial:   less than 140 mg/dL      Peak postprandial:   less than 180 mg/dL (1-2 hours)      Critically ill patients:  140 - 180 mg/dL   Review of Glycemic Control  Results for HARLIN, VANDERKOOI (MRN [id number]) as of 08/02/2015 14:32  Ref. Range 08/01/2015 12:32 08/01/2015 16:49 08/01/2015 22:06 08/02/2015 07:48 08/02/2015 12:11  Glucose-Capillary Latest Ref Range: 65-99 mg/dL 277 (H) 198 (H) 182 (H) 184 (H) 207 (H)   Pt requesting CHO coverage as she was on 70/30 at home.  Inpatient Diabetes Program Recommendations:  Insulin - Meal Coverage: Add Novolog 4 units tidwc   Will continue to follow. Thank you. Lorenda Peck, RD, LDN, CDE Inpatient Diabetes Coordinator [phone]

## 2015-08-03 LAB — GLUCOSE, CAPILLARY
GLUCOSE-CAPILLARY: 155 mg/dL — AB (ref 65–99)
GLUCOSE-CAPILLARY: 162 mg/dL — AB (ref 65–99)
Glucose-Capillary: 183 mg/dL — ABNORMAL HIGH (ref 65–99)
Glucose-Capillary: 208 mg/dL — ABNORMAL HIGH (ref 65–99)

## 2015-08-03 LAB — CBC
HCT: 17.8 % — ABNORMAL LOW (ref 36.0–46.0)
HEMOGLOBIN: 5.9 g/dL — AB (ref 12.0–15.0)
MCH: 23.6 pg — AB (ref 26.0–34.0)
MCHC: 33.1 g/dL (ref 30.0–36.0)
MCV: 71.2 fL — AB (ref 78.0–100.0)
PLATELETS: 54 10*3/uL — AB (ref 150–400)
RBC: 2.5 MIL/uL — AB (ref 3.87–5.11)
RDW: 27.8 % — ABNORMAL HIGH (ref 11.5–15.5)
WBC: 9.5 10*3/uL (ref 4.0–10.5)

## 2015-08-03 LAB — COMPREHENSIVE METABOLIC PANEL
ALBUMIN: 3.7 g/dL (ref 3.5–5.0)
ALK PHOS: 82 U/L (ref 38–126)
ALT: 13 U/L — ABNORMAL LOW (ref 14–54)
ANION GAP: 8 (ref 5–15)
AST: 22 U/L (ref 15–41)
BUN: 32 mg/dL — ABNORMAL HIGH (ref 6–20)
CALCIUM: 8.6 mg/dL — AB (ref 8.9–10.3)
CHLORIDE: 115 mmol/L — AB (ref 101–111)
CO2: 18 mmol/L — AB (ref 22–32)
Creatinine, Ser: 1.68 mg/dL — ABNORMAL HIGH (ref 0.44–1.00)
GFR calc non Af Amer: 29 mL/min — ABNORMAL LOW (ref >60)
GFR, EST AFRICAN AMERICAN: 34 mL/min — AB (ref >60)
GLUCOSE: 193 mg/dL — AB (ref 65–99)
POTASSIUM: 4.5 mmol/L (ref 3.5–5.1)
SODIUM: 141 mmol/L (ref 135–145)
Total Bilirubin: 1.6 mg/dL — ABNORMAL HIGH (ref 0.3–1.2)
Total Protein: 6.9 g/dL (ref 6.5–8.1)

## 2015-08-03 LAB — HEMOGLOBIN A1C
HEMOGLOBIN A1C: 5.3 % (ref 4.8–5.6)
MEAN PLASMA GLUCOSE: 105 mg/dL

## 2015-08-03 LAB — PREPARE RBC (CROSSMATCH)

## 2015-08-03 MED ORDER — SODIUM CHLORIDE 0.9 % IV SOLN
Freq: Once | INTRAVENOUS | Status: AC
  Administered 2015-08-04: 20:00:00 via INTRAVENOUS

## 2015-08-03 NOTE — Progress Notes (Signed)
[facility] PROGRESS NOTE  Rachel Palmer [id number] DOB: [birth year] DOA: 07/29/2015 PCP: Maylon Peppers, MD  Brief narrative: Patient is a 74 year old woman with severe anemia in context of history of significant for SCD. Presented to the hospital at the recommendation of her hematologist secondary to profound anemia. Hospital course has been complicated in that patient is difficult to transfuse secondary to multiple antibiotics.  Assessment/Plan: Principal Problem:   Anemia in chronic kidney disease - Will place order for another unit (patient had 1 unit transfused 08/02/15), difficult to transfuse because of multiple antibodies - Patient given Procrit dose and IV Iron - No clear plan for disposition from hematologist as such discussed case with hematologist on call. Will obtain PT evaluation. Place order for another unit to be transferred. Dr. Marin Olp considering bone marrow bx  Active Problems:   Sickle cell disease, type [place] ([place]) - hematology on board.    Chronic kidney disease (CKD), stage IV (severe) (HCC) - stable and S creatinine trending down    Type 2 diabetes mellitus ([place]) - continue carb modified diet, ssi. - continue current lantus dose    Enlargement of spleen   Thrombocytopenia (HCC)  Code Status: full Family Communication: none at bedside. Disposition Plan: Pending improvement in hgb levels   Consultants:  Oncology/hematology  Procedures:  none  Antibiotics:  none   HPI/Subjective: Pt has no new complaints.   Objective: Filed Vitals:   08/03/15 1515  BP: 151/96  Pulse: 82  Temp:   Resp: 20    Intake/Output Summary (Last 24 hours) at 08/03/15 1705 Last data filed at 08/03/15 0606  Gross per 24 hour  Intake  712.5 ml  Output   1500 ml  Net -787.5 ml   Filed Weights   07/29/15 1707 07/29/15 2255  Weight: 89.359 kg (197 lb) 90.039 kg (198 lb 8 oz)    Exam:   General:  Pt in nad, alert and awake  Cardiovascular: rrr, no  rubs  Respiratory: no increased wob, equal chest rise  Abdomen: soft, nd, nt  Musculoskeletal: equal tone BL   Data Reviewed: Basic Metabolic Panel:  Recent Labs Lab 07/29/15 1459 07/29/15 1725 07/30/15 0430 08/03/15 0600  NA 135 138 139 141  K 4.5 4.5 4.3 4.5  CL 110 115* 116* 115*  CO2 16* 15* 16* 18*  GLUCOSE 325* 231* 248* 193*  BUN 48* 48* 41* 32*  CREATININE 2.27* 2.36* 2.20* 1.68*  CALCIUM 8.4* 8.5* 8.1* 8.6*   Liver Function Tests:  Recent Labs Lab 07/29/15 1459 07/29/15 1725 07/30/15 0430 08/03/15 0600  AST 35 42* 29 22  ALT 12 16 12* 13*  ALKPHOS 100 98 78 82  BILITOT 1.2 1.4* 1.2 1.6*  PROT 7.3 7.6 6.5 6.9  ALBUMIN 4.4 4.3 3.6 3.7   No results for input(s): LIPASE, AMYLASE in the last 168 hours. No results for input(s): AMMONIA in the last 168 hours. CBC:  Recent Labs Lab 07/29/15 1459 07/29/15 1725 08/02/15 0500 08/02/15 2335  WBC 12.6 Corrected for nRBC* 12.3* 10.7* 9.5  NEUTROABS 8.7* 6.8  --   --   HGB 5.1* 5.2* 4.6* 5.9*  HCT 15.5* 16.2* 14.4* 17.8*  MCV 67* 66.4* 68.6* 71.2*  PLT 51* 63* 64* 54*   Cardiac Enzymes: No results for input(s): CKTOTAL, CKMB, CKMBINDEX, TROPONINI in the last 168 hours. BNP (last 3 results) No results for input(s): BNP in the last 8760 hours.  ProBNP (last 3 results) No results for input(s): PROBNP in the last 8760  hours.  CBG:  Recent Labs Lab 08/02/15 1649 08/02/15 2219 08/03/15 0813 08/03/15 1235 08/03/15 1631  GLUCAP 176* 264* 155* 183* 162*    No results found for this or any previous visit (from the past 240 hour(s)).   Studies: No results found.  Scheduled Meds: . sodium chloride   Intravenous Once  . folic acid  1 mg Oral Daily  . hydrochlorothiazide  12.5 mg Oral Daily  . insulin aspart  0-15 Units Subcutaneous TID WC  . insulin aspart  0-5 Units Subcutaneous QHS  . insulin glargine  26 Units Subcutaneous BID  . metoprolol succinate  100 mg Oral Daily  .  mometasone-formoterol  2 puff Inhalation BID  . montelukast  10 mg Oral q morning - 10a  . pantoprazole  40 mg Oral Daily  . sertraline  100 mg Oral Daily  . Vitamin D (Ergocalciferol)  50,000 Units Oral Weekly   Continuous Infusions:    Time spent: > 20 minutes    [name]  [facility] Pager [phone] If 7PM-7AM, please contact night-coverage at [email or web address], password [facility] 08/03/2015, 5:05 PM  LOS: 5 days

## 2015-08-03 NOTE — Progress Notes (Signed)
COURTESY NOTE:  Called by hospitalist re. this 74 y/o [place] woman with severe anemia in the seeting of SCD. The patient has multiple other problems including CKD, DNM poorly controlled, and some evidence of iron deficiency (s/p feraheme yesterday). Patient was set up for transfusion but because of Abs has been very difficult to match. Did receive one unit with some response but Hb remain <6  The hospitalist expressed inpatience with the patient's contniued stay w/o a definite discharge plan. My suggestion was to discuss with Dr Marin Olp who will be back tomorrow-- according to his note from yesterday he is planning a bone marrow biopsy and suspects a primary bone marrow problem (not just SCD) in this patient. In the meantime a PT evaluation can be helpful in terms of defining what patient's needs are going to be if her condition does not improve. I do not know if transfer to a tertiary care facility would facilitate transfusion but that can also be explored.  I am copying Dr Marin Olp and Dr Rolanda Jay on this note.

## 2015-08-04 DIAGNOSIS — D509 Iron deficiency anemia, unspecified: Secondary | ICD-10-CM | POA: Diagnosis present

## 2015-08-04 DIAGNOSIS — R161 Splenomegaly, not elsewhere classified: Secondary | ICD-10-CM | POA: Insufficient documentation

## 2015-08-04 DIAGNOSIS — N183 Chronic kidney disease, stage 3 unspecified: Secondary | ICD-10-CM | POA: Insufficient documentation

## 2015-08-04 DIAGNOSIS — I10 Essential (primary) hypertension: Secondary | ICD-10-CM | POA: Diagnosis present

## 2015-08-04 LAB — CBC WITH DIFFERENTIAL/PLATELET
BASOS ABS: 0.1 10*3/uL (ref 0.0–0.1)
BASOS PCT: 1 %
EOS PCT: 2 %
Eosinophils Absolute: 0.2 10*3/uL (ref 0.0–0.7)
HEMATOCRIT: 21.2 % — AB (ref 36.0–46.0)
HEMOGLOBIN: 6.8 g/dL — AB (ref 12.0–15.0)
LYMPHS ABS: 2.5 10*3/uL (ref 0.7–4.0)
Lymphocytes Relative: 32 %
MCH: 23.4 pg — ABNORMAL LOW (ref 26.0–34.0)
MCHC: 32.1 g/dL (ref 30.0–36.0)
MCV: 73.1 fL — AB (ref 78.0–100.0)
MONOS PCT: 6 %
Monocytes Absolute: 0.5 10*3/uL (ref 0.1–1.0)
NEUTROS ABS: 4.5 10*3/uL (ref 1.7–7.7)
Neutrophils Relative %: 59 %
Platelets: 54 10*3/uL — ABNORMAL LOW (ref 150–400)
RBC: 2.9 MIL/uL — AB (ref 3.87–5.11)
RDW: 28.2 % — AB (ref 11.5–15.5)
WBC: 7.8 10*3/uL (ref 4.0–10.5)
nRBC: 21 /100 WBC — ABNORMAL HIGH

## 2015-08-04 LAB — TYPE AND SCREEN
ABO/RH(D): O POS
ANTIBODY SCREEN: POSITIVE
DAT, IgG: NEGATIVE
Unit division: 0

## 2015-08-04 LAB — COMPREHENSIVE METABOLIC PANEL
ALT: 10 U/L — AB (ref 14–54)
ANION GAP: 7 (ref 5–15)
AST: 22 U/L (ref 15–41)
Albumin: 3.6 g/dL (ref 3.5–5.0)
Alkaline Phosphatase: 79 U/L (ref 38–126)
BILIRUBIN TOTAL: 1.3 mg/dL — AB (ref 0.3–1.2)
BUN: 37 mg/dL — ABNORMAL HIGH (ref 6–20)
CHLORIDE: 115 mmol/L — AB (ref 101–111)
CO2: 18 mmol/L — ABNORMAL LOW (ref 22–32)
Calcium: 8.7 mg/dL — ABNORMAL LOW (ref 8.9–10.3)
Creatinine, Ser: 1.66 mg/dL — ABNORMAL HIGH (ref 0.44–1.00)
GFR, EST AFRICAN AMERICAN: 34 mL/min — AB (ref >60)
GFR, EST NON AFRICAN AMERICAN: 30 mL/min — AB (ref >60)
Glucose, Bld: 217 mg/dL — ABNORMAL HIGH (ref 65–99)
POTASSIUM: 4.5 mmol/L (ref 3.5–5.1)
SODIUM: 140 mmol/L (ref 135–145)
TOTAL PROTEIN: 6.5 g/dL (ref 6.5–8.1)

## 2015-08-04 LAB — GLUCOSE, CAPILLARY
GLUCOSE-CAPILLARY: 245 mg/dL — AB (ref 65–99)
GLUCOSE-CAPILLARY: 169 mg/dL — AB (ref 65–99)
Glucose-Capillary: 192 mg/dL — ABNORMAL HIGH (ref 65–99)
Glucose-Capillary: 163 mg/dL — ABNORMAL HIGH (ref 65–99)

## 2015-08-04 LAB — RETICULOCYTES
RBC.: 2.9 MIL/uL — AB (ref 3.87–5.11)
RETIC COUNT ABSOLUTE: 281.3 10*3/uL — AB (ref 19.0–186.0)
Retic Ct Pct: 9.7 % — ABNORMAL HIGH (ref 0.4–3.1)

## 2015-08-04 MED ORDER — INSULIN ASPART 100 UNIT/ML ~~LOC~~ SOLN
3.0000 [IU] | Freq: Three times a day (TID) | SUBCUTANEOUS | Status: DC
  Administered 2015-08-04 – 2015-08-06 (×6): 3 [IU] via SUBCUTANEOUS

## 2015-08-04 NOTE — Progress Notes (Signed)
Notified K. Schorr,NP that the pt has not received ordered blood transfusion due to pt not having any compatible units available locally to transfuse per Blood Bank. Blood Bank states that pt could receive frozen units for [facility] but the shelf-life is reduced to 24 hours and that the process is expensive. Lamar Blinks, NP agrees to obtain units from the [employer] and [employer].

## 2015-08-04 NOTE — Progress Notes (Signed)
Inpatient Diabetes Program Recommendations  AACE/ADA: New Consensus Statement on Inpatient Glycemic Control (2015)  Target Ranges:  Prepandial:   less than 140 mg/dL      Peak postprandial:   less than 180 mg/dL (1-2 hours)      Critically ill patients:  140 - 180 mg/dL   Results for Rachel Palmer, AASE (MRN [id number]) as of 08/04/2015 13:36  Ref. Range 08/04/2015 07:44 08/04/2015 11:50  Glucose-Capillary Latest Ref Range: 65-99 mg/dL 192 (H) 245 (H)    Admit Anemia   History: DM, CKD4  Home Insulin: 70/30 insulin- 35 units AM/ 38 units PM  Current Orders: Lantus 26 units bid      Novolog Moderate SSI (0-15 units) TID AC + HS     MD- Please consider starting Novolog Meal Coverage-  Novolog 4 units tid with meals  Patient can resume her 70/30 insulin at time of discharge     --Will follow patient during hospitalization--  Wyn Quaker RN, MSN, CDE Diabetes Coordinator Inpatient Glycemic Control Team Team Pager: [phone] (8a-5p)

## 2015-08-04 NOTE — Progress Notes (Signed)
PROGRESS NOTE    Rachel Palmer:[id number] DOB: [birth year] DOA: 07/29/2015 PCP: Maylon Peppers, MD  HPI/Brief narrative 74 year old female with history of hemoglobin [place] disease, chronic anemia, stage IV chronic kidney disease, gout and 2 DM, HTN, recently returned to the [place] area from [place], had outpatient follow-up with Dr. Marin Olp, hematologist, complained of fatigue, dizziness and weakness off 3-4 days duration. In the office, hemoglobin found to be 5.2 and patient was referred to ED for admission. No symptoms to suggest bleeding from anywhere. Reported that she had a colonoscopy 3 years ago and benign polyps were removed.   Assessment/Plan:  Anemia - Unclear etiology. - Hematology consultation and follow-up appreciated. Dr. Marin Olp reviewed her records from [place] and her hemoglobin apparently was pretty stable. He does not think that this is from sickle cell. - No symptoms to suggest bleeding as etiology. - Dr. Marin Olp has requested bone marrow biopsy by interventional radiology. - Hemoglobin has improved to 6.8 from 5.1 on admission after a unit of PRBC. Supposed to get additional unit on 11/16. Difficulty obtaining blood for transfusion easily due to multiple antibodies - Status post dose of Procrit and IV iron   Acute on Stage III chronic kidney disease  - Last creatinine prior to admission: 1.56 on 02/13/13. Admitted with creatinine of 2.36. - Creatinine has improved and plateaued off in the 1.6 range which may be her baseline. Acute renal failure resolved.  Thrombocytopenia Seems chronic impaired to her lab work in 2014. Relatively stable and no bleeding reported.   Type 2 DM with renal complications - At home patient takes 70/30 insulin-35 units a.m./38 units p.m. - In the hospital she is on Lantus 26 units twice a day along with NovoLog SSI - CBGs fluctuating as below between 162-245.  - diabetes coordinator input appreciated and we'll add  mealtime NovoLog   Essential hypertension  - Controlled. DC HCTZ secondary to advanced chronic kidney disease. Continue metoprolol.   Non-anion gap metabolic acidosis - Probably related to chronic kidney disease. May consider starting bicarbonate supplements orally.  Gout  Asthma  Hemoglobin [place] disease - Management per hematology. Not in acute crisis.   DVT prophylaxis: SCDs Code Status: Full Family Communication: None at bedside Disposition Plan: DC home when medically stable-may be another 3-4 days.   Consultants:  Hematology  Procedures:  None  Antibiotics:  None   Subjective: Patient states that he did not feel so well yesterday for no particular reason and no specific complaints. She feels much better today and denies complaints. No dizziness, lightheadedness, chest pain or pain elsewhere.  Objective: Filed Vitals:   08/04/15 0648 08/04/15 0800 08/04/15 0859 08/04/15 1430  BP: 132/61 154/70  157/75  Pulse: 71 70  72  Temp: 98.1 F (36.7 C) 98.3 F (36.8 C)  98.1 F (36.7 C)  TempSrc: Oral Oral  Oral  Resp: _0 Height:      Weight:      SpO2: 100% 100% 96% 100%    Intake/Output Summary (Last 24 hours) at 08/04/15 1621 Last data filed at 08/04/15 1140  Gross per 24 hour  Intake    120 ml  Output   2350 ml  Net  -2230 ml   Filed Weights   07/29/15 1707 07/29/15 2255  Weight: 89.359 kg (197 lb) 90.039 kg (198 lb 8 oz)     Exam:  General exam: Pleasant elderly female sitting up comfortably in bed. Respiratory system: Clear. No increased work  of breathing. Cardiovascular system: S1 & S2 heard, RRR. No JVD, murmurs, gallops, clicks or pedal edema. Telemetry: Sinus rhythm  Gastrointestinal system: Abdomen is nondistended, soft and nontender. Normal bowel sounds heard. Central nervous system: Alert and oriented. No focal neurological deficits. Extremities: Symmetric 5 x 5 power.   Data Reviewed: Basic Metabolic Panel:  Recent Labs Lab  07/29/15 1459 07/29/15 1725 07/30/15 0430 08/03/15 0600 08/04/15 0516  NA 135 138 139 141 140  K 4.5 4.5 4.3 4.5 4.5  CL 110 115* 116* 115* 115*  CO2 16* 15* 16* 18* 18*  GLUCOSE 325* 231* 248* 193* 217*  BUN 48* 48* 41* 32* 37*  CREATININE 2.27* 2.36* 2.20* 1.68* 1.66*  CALCIUM 8.4* 8.5* 8.1* 8.6* 8.7*   Liver Function Tests:  Recent Labs Lab 07/29/15 1459 07/29/15 1725 07/30/15 0430 08/03/15 0600 08/04/15 0516  AST 35 42* _0 ALT 12 16 12* 13* 10*  ALKPHOS 100 98 78 82 79  BILITOT 1.2 1.4* 1.2 1.6* 1.3*  PROT 7.3 7.6 6.5 6.9 6.5  ALBUMIN 4.4 4.3 3.6 3.7 3.6   No results for input(s): LIPASE, AMYLASE in the last 168 hours. No results for input(s): AMMONIA in the last 168 hours. CBC:  Recent Labs Lab 07/29/15 1459 07/29/15 1725 08/02/15 0500 08/02/15 2335 08/04/15 0616  WBC 12.6 Corrected for nRBC* 12.3* 10.7* 9.5 7.8  NEUTROABS 8.7* 6.8  --   --  4.5  HGB 5.1* 5.2* 4.6* 5.9* 6.8*  HCT 15.5* 16.2* 14.4* 17.8* 21.2*  MCV 67* 66.4* 68.6* 71.2* 73.1*  PLT 51* 63* 64* 54* 54*   Cardiac Enzymes: No results for input(s): CKTOTAL, CKMB, CKMBINDEX, TROPONINI in the last 168 hours. BNP (last 3 results) No results for input(s): PROBNP in the last 8760 hours. CBG:  Recent Labs Lab 08/03/15 1235 08/03/15 1631 08/03/15 2120 08/04/15 0744 08/04/15 1150  GLUCAP 183* 162* 208* 192* 245*    No results found for this or any previous visit (from the past 240 hour(s)).        Studies: No results found.      Scheduled Meds: . sodium chloride   Intravenous Once  . folic acid  1 mg Oral Daily  . hydrochlorothiazide  12.5 mg Oral Daily  . insulin aspart  0-15 Units Subcutaneous TID WC  . insulin aspart  0-5 Units Subcutaneous QHS  . insulin glargine  26 Units Subcutaneous BID  . metoprolol succinate  100 mg Oral Daily  . mometasone-formoterol  2 puff Inhalation BID  . montelukast  10 mg Oral q morning - 10a  . pantoprazole  40 mg Oral Daily  .  sertraline  100 mg Oral Daily  . 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 [facility] Pager [phone]  If 7PM-7AM, please contact night-coverage [email or web address] Password [email or web address] 08/04/2015, 4:21 PM    LOS: 6 days

## 2015-08-04 NOTE — Progress Notes (Signed)
Rachel Palmer looks a little better. She is only had 1 unit of blood. It looks like from the record, that there is another unit available. I totally agree that she would be benefited from a transfusion.  I did get some records from [place]. Her hemoglobin up there was pretty stable. Again, I'm not sure as to why she had this drop. I don't think is from the sickle cell.  I really think that we are going to have to put her through a bone marrow biopsy. I just want to make sure that there is nothing going on in the bone marrow that might be causing this anemia to be more profound. I talked to her about this today. I explained to her how this is done. Radiology will do this for [place]. She agrees.  Her blood sugars are still on the high side. I know that the hospitalist on doing a great job and try to manage this.  Renal function is improving slowly. Today, her creatinine is 1.66.  I don't see any CBC. I will had to make sure one is done daily. I want to make sure that her reticulocyte count is done with it.  Her appetite has been pretty good. She's had no nausea or vomiting. Per she does complain of some discomfort over the right side of the abdomen.  On her physical exam, her vital signs are all stable. Her blood pressure is 150/61. Her temperature is 98.1. Pulse is 71. Head and neck exam shows no ocular or oral lesions. She has no adenopathy in the neck. Lungs are clear bilaterally. Cardiac exam regular rate and rhythm with no murmurs, rubs or bruits. Abdomen is soft. She is slightly distended. There is some slight tenderness to palpation along the right side. I cannot palpate her liver. Her spleen is non-palpable. She is somewhat obese. Extremities shows some trace edema in her legs.  We will have to set of the bone marrow biopsy. I suspect they probably will be done tomorrow.  We need to follow along her blood counts. I will set this up also. I want to make sure check her reticulocyte count.  A  lot of this just might be the fact that she needs Procrit.  We did give her some IV iron while she has been in the hospital.  I appreciate all the great care that she is getting from everybody up on [place].  [place] 2:14

## 2015-08-04 NOTE — Progress Notes (Signed)
Rec'd t/c back from [facility] @ Dr. Antonieta Pert office that Dr. Marin Olp would like pt to receive 2nd unit of blood.  Lind Guest, RN

## 2015-08-04 NOTE — Consult Note (Signed)
Chief Complaint: Patient was seen in consultation today for CT guided bone marrow biopsy Chief Complaint  Patient presents with  . Anemia    Referring Physician(s): Ennever  History of Present Illness: Rachel Palmer is a 74 y.o. female with history of HTN, DM, CKD, splenomegaly, thrombocytopenia and severe anemia in the setting of sickle cell disease. Request now received from oncology for CT-guided bone marrow biopsy for further evaluation.  Past Medical History  Diagnosis Date  . Hypertension   . Sickle cell disease ([place])   . Diabetes mellitus   . Anemia, chronic renal failure 08/04/2011  . Sickle cell disease, type [place] ([place]) 08/04/2011  . Blood transfusion without reported diagnosis     Past Surgical History  Procedure Laterality Date  . Cholecystectomy    . Shoulder surgery    . Appendectomy    . Knee surgery      Allergies: Codeine; Heparin cross reactors; Iohexol; Ivp dye; Keflex; Propoxyphene; and Tape  Medications: Prior to Admission medications   Medication Sig Start Date End Date Taking? Authorizing Provider  albuterol (PROVENTIL HFA;VENTOLIN HFA) 108 (90 BASE) MCG/ACT inhaler Inhale 2 puffs into the lungs every 6 (six) hours as needed for wheezing or shortness of breath.    Yes Historical Provider, MD  allopurinol (ZYLOPRIM) 100 MG tablet Take 100 mg by mouth every other day.  06/24/15  Yes Historical Provider, MD  cetirizine (ZYRTEC) 10 MG tablet Take 10 mg by mouth daily.    Yes Historical Provider, MD  ergocalciferol (VITAMIN D2) 50000 UNITS capsule Take 50,000 Units by mouth once a week. Tuesday   Yes Historical Provider, MD  Fluticasone-Salmeterol (ADVAIR) 500-50 MCG/DOSE AEPB Inhale 1 puff into the lungs 2 (two) times daily.   Yes Historical Provider, MD  folic acid (FOLVITE) 1 MG tablet Take 1 mg by mouth daily.   Yes Historical Provider, MD  furosemide (LASIX) 20 MG tablet Take 20 mg by mouth as needed for fluid.  06/24/15  Yes Historical Provider,  MD  hydrochlorothiazide (MICROZIDE) 12.5 MG capsule Take 12.5 mg by mouth daily.   Yes Historical Provider, MD  insulin aspart protamine- aspart (NOVOLOG MIX 70/30) (70-30) 100 UNIT/ML injection Inject 35-38 Units into the skin 2 (two) times daily with a meal. 35 in the morning and 38 in the evening   Yes Historical Provider, MD  Metoprolol Succinate (TOPROL XL PO) Take 100 mg by mouth daily.    Yes Historical Provider, MD  montelukast (SINGULAIR) 10 MG tablet Take 10 mg by mouth every morning.   Yes Historical Provider, MD  Multiple Vitamin (MULTIVITAMIN WITH MINERALS) TABS tablet Take 1 tablet by mouth daily.   Yes Historical Provider, MD  omeprazole (PRILOSEC) 40 MG capsule Take 40 mg by mouth daily. 07/08/15  Yes Historical Provider, MD  oxycodone (OXY-IR) 5 MG capsule Take 5 mg by mouth every 4 (four) hours as needed for pain.    Yes Historical Provider, MD  ramipril (ALTACE) 10 MG capsule Take 10 mg by mouth 2 (two) times daily. 05/31/15  Yes Historical Provider, MD  sertraline (ZOLOFT) 100 MG tablet Take 100 mg by mouth daily.   Yes Historical Provider, MD     Family History  Problem Relation Age of Onset  . Stroke Mother     Social History   Social History  . Marital Status: Married    Spouse Name: N/A  . Number of Children: N/A  . Years of Education: N/A   Social History  Main Topics  . Smoking status: Former Research scientist (life sciences)  . Smokeless tobacco: None  . Alcohol Use: No  . Drug Use: No  . Sexual Activity: Not Asked   Other Topics Concern  . None   Social History Narrative      Review of Systems  Constitutional: Positive for fatigue. Negative for fever and chills.  Respiratory: Positive for shortness of breath. Negative for cough.   Cardiovascular: Negative for chest pain.  Gastrointestinal: Positive for abdominal pain. Negative for nausea, vomiting and blood in stool.  Genitourinary: Negative for dysuria and hematuria.  Musculoskeletal: Negative for back pain.    Neurological: Negative for headaches.    Vital Signs: BP 157/75 mmHg  Pulse 72  Temp(Src) 98.1 F (36.7 C) (Oral)  Resp 20  Ht $R'5\' 5"'pZ$  (1.651 m)  Wt 198 lb 8 oz (90.039 kg)  BMI 33.03 kg/m2  SpO2 100%  Physical Exam  Constitutional: She is oriented to person, place, and time. She appears well-developed and well-nourished.  Cardiovascular: Normal rate and regular rhythm.   Pulmonary/Chest: Effort normal and breath sounds normal.  Abdominal: Soft. Bowel sounds are normal.  Obese; Mild to moderate tenderness to palpation right lat/ lower abdominal region  Musculoskeletal: Normal range of motion. She exhibits no edema.  Neurological: She is alert and oriented to person, place, and time.    Mallampati Score:     Imaging: US Abdomen Complete  07/30/2015  CLINICAL DATA:  Splenomegaly EXAM: ULTRASOUND ABDOMEN COMPLETE COMPARISON:  Abdominal ultrasound dated 06/22/2011. CT abdomen pelvis dated 02/04/2010. FINDINGS: Gallbladder: Surgically absent. Common bile duct: Diameter: 4 mm Liver: Hyperechoic hepatic parenchyma, suggesting hepatic steatosis. 2.0 x 1.9 x 2.0 cm complex/septated probable cyst in the lateral segment left hepatic lobe, previously 1.5 cm in 2011. Additional 1.3 x 1.0 x 0.9 cm simple cyst. IVC: No abnormality visualized. Pancreas: Visualized portion unremarkable. Spleen: Enlarged, measuring 15.5 x 16.5 x 6.5 cm (calculated volume 869 mL), decreased (previously calculated volume 1215 mL). Right Kidney: Length: 9.6 cm. Echogenic renal parenchyma with cortical thinning/ atrophy. No hydronephrosis. Left Kidney: Length: 9.5 cm. Echogenic renal parenchyma with cortical thinning/ atrophy. No hydronephrosis. Abdominal aorta: No aneurysm visualized. Other findings: None. IMPRESSION: Hepatic steatosis. Two probable hepatic cysts in the left hepatic lobe, one of which is complex/ septated and mildly increased from 2011, but still likely benign. Splenomegaly, measuring up to 16.5 cm  (calculated volume 869 mL), decreased from 2012. Status post cholecystectomy. Bilateral renal atrophy with suspected medical renal disease. No hydronephrosis. Electronically Signed   By: Julian Hy M.D.   On: 07/30/2015 08:20    Labs:  CBC:  Recent Labs  07/29/15 1725 08/02/15 0500 08/02/15 2335 08/04/15 0616  WBC 12.3* 10.7* 9.5 7.8  HGB 5.2* 4.6* 5.9* 6.8*  HCT 16.2* 14.4* 17.8* 21.2*  PLT 63* 64* 54* 54*    COAGS:  Recent Labs  07/29/15 1725  INR 1.30  APTT 29    BMP:  Recent Labs  07/29/15 1725 07/30/15 0430 08/03/15 0600 08/04/15 0516  NA 138 139 141 140  K 4.5 4.3 4.5 4.5  CL 115* 116* 115* 115*  CO2 15* 16* 18* 18*  GLUCOSE 231* 248* 193* 217*  BUN 48* 41* 32* 37*  CALCIUM 8.5* 8.1* 8.6* 8.7*  CREATININE 2.36* 2.20* 1.68* 1.66*  GFRNONAA 19* 21* 29* 30*  GFRAA 23* 25* 34* 34*    LIVER FUNCTION TESTS:  Recent Labs  07/29/15 1725 07/30/15 0430 08/03/15 0600 08/04/15 0516  BILITOT 1.4* 1.2 1.6* 1.3*  AST 42* $Remov'29 22 22  'MmQpRE$ ALT 16 12* 13* 10*  ALKPHOS 98 78 82 79  PROT 7.6 6.5 6.9 6.5  ALBUMIN 4.3 3.6 3.7 3.6    TUMOR MARKERS: No results for input(s): AFPTM, CEA, CA199, CHROMGRNA in the last 8760 hours.  Assessment and Plan:  Pt with history of HTN, DM, CKD, splenomegaly, thrombocytopenia and severe anemia in the setting of sickle cell disease. Request now received from oncology for CT-guided bone marrow biopsy for further evaluation.Risks and benefits discussed with the patient including, but not limited to bleeding, infection, damage to adjacent structures or low yield requiring additional tests. All of the patient's questions were answered, patient is agreeable to proceed. Consent signed and in chart. Procedure planned for 11/17 a.m.     Thank you for this interesting consult.  I greatly enjoyed meeting Rachel Palmer and look forward to participating in their care.  A copy of this report was sent to the requesting provider on this  date.  Signed: D. Rowe Robert 08/04/2015, 4:24 PM   I spent a total of 20 minutes in face to face in clinical consultation, greater than 50% of which was counseling/coordinating care for CT guided bone marrow biopsy

## 2015-08-04 NOTE — Progress Notes (Signed)
PT Cancellation Note  Patient Details Name: Rachel Palmer MRN: [id number] DOB: 07-10-1941   Cancelled Treatment:    Reason Eval/Treat Not Completed: Medical issues which prohibited therapy. Hgb 6.8. Will hold PT at this time and check back another time to complete eval. Thanks.    Weston Anna, MPT Pager: [phone]

## 2015-08-04 NOTE — Progress Notes (Signed)
As per Dr. Janelle Floor request, l/m on RN voicemail @ Dr. Antonieta Pert office requesting call back for question of whether Dr. Marin Olp wants pt to receive the 2nd unit of blood since Hgb has risen to 6.8 today.   Lind Guest, RN

## 2015-08-05 ENCOUNTER — Inpatient Hospital Stay (HOSPITAL_COMMUNITY): Payer: Medicare Other

## 2015-08-05 DIAGNOSIS — E119 Type 2 diabetes mellitus without complications: Secondary | ICD-10-CM

## 2015-08-05 LAB — TYPE AND SCREEN
ABO/RH(D): O POS
ANTIBODY SCREEN: POSITIVE
DAT, IgG: NEGATIVE
Unit division: 0

## 2015-08-05 LAB — GLUCOSE, CAPILLARY
Glucose-Capillary: 154 mg/dL — ABNORMAL HIGH (ref 65–99)
Glucose-Capillary: 217 mg/dL — ABNORMAL HIGH (ref 65–99)
Glucose-Capillary: 196 mg/dL — ABNORMAL HIGH (ref 65–99)
Glucose-Capillary: 125 mg/dL — ABNORMAL HIGH (ref 65–99)

## 2015-08-05 LAB — RETICULOCYTES
RBC.: 3.65 MIL/uL — AB (ref 3.87–5.11)
RETIC COUNT ABSOLUTE: 299.3 10*3/uL — AB (ref 19.0–186.0)
RETIC CT PCT: 8.2 % — AB (ref 0.4–3.1)

## 2015-08-05 LAB — BONE MARROW EXAM

## 2015-08-05 LAB — PROTIME-INR
INR: 1.1 (ref 0.00–1.49)
PROTHROMBIN TIME: 14.4 s (ref 11.6–15.2)

## 2015-08-05 MED ORDER — FENTANYL CITRATE (PF) 100 MCG/2ML IJ SOLN
INTRAMUSCULAR | Status: AC
  Filled 2015-08-05: qty 4

## 2015-08-05 MED ORDER — FOLIC ACID 1 MG PO TABS
2.0000 mg | ORAL_TABLET | Freq: Every day | ORAL | Status: DC
  Administered 2015-08-05 – 2015-08-06 (×2): 2 mg via ORAL
  Filled 2015-08-05 (×2): qty 2

## 2015-08-05 MED ORDER — FOLIC ACID 1 MG PO TABS: 2.0000 mg | ORAL_TABLET | Freq: Every day | ORAL | Status: DC

## 2015-08-05 MED ORDER — FENTANYL CITRATE (PF) 100 MCG/2ML IJ SOLN
INTRAMUSCULAR | Status: AC | PRN
  Administered 2015-08-05: 50 ug via INTRAVENOUS

## 2015-08-05 MED ORDER — MIDAZOLAM HCL 2 MG/2ML IJ SOLN
INTRAMUSCULAR | Status: AC
  Filled 2015-08-05: qty 6

## 2015-08-05 MED ORDER — MIDAZOLAM HCL 2 MG/2ML IJ SOLN
INTRAMUSCULAR | Status: AC | PRN
  Administered 2015-08-05 (×4): 1 mg via INTRAVENOUS

## 2015-08-05 NOTE — Progress Notes (Signed)
Rachel Palmer is feeling okay. She is scheduled for a bone marrow biopsy today.  Her hemoglobin has improved nicely. Hemoglobin is now up to 8.8. She still has some microcytic indices. She did get iron earlier this week.  Her platelet count is holding steady at 55.  Her reticulocyte count seems to be doing better. It's indicative, I think, of her bone marrow making more blood.  Her blood sugars might be a little better. My concern is how that is is going to be managed as an outpatient. She really needs to have a family doctor or endocrinologist to manage her blood sugars.  She did have a hemoglobin A1c on admission, surprising, it was only 5.3.  She is not hurting. She is getting out of bed. She is not having diarrhea.  I did change her folic acid up to 2 mg a day.  Her physical exam is premature unchanged. Her temperature is 97.7. Pulse 65. Blood pressure 133/72. Her hent exam shows no ocular or oral lesions. She has no palpable cervical or supraclavicular lymph nodes. Lungs are clear. Cardiac exam regular rate and rhythm with no murmurs, rubs or bruits. Abdomen is soft. She has good bowel sounds. There is no fluid wave. I really cannot palpate her liver or spleen. Extremities shows some trace edema in her legs. Neurological exam is nonfocal.  From a hematologic point of view, she really can be discharged after the bone marrow biopsy. I can't think of any else that needs to be done with her sickle cell during this hospitalization.  I think that if we could just get her on a schedule for Proc[place], I think this will help with her anemia.  I very much appreciate everybody's help while she was hospitalized. Everybody did a fantastic job take care of her. I very much appreciate all the compassion that was shown to her.  I will follow-up with her in about one week.  Pete E.  Rodman Key 7:7

## 2015-08-05 NOTE — Discharge Instructions (Signed)
Anemia, Nonspecific Anemia is a condition in which the concentration of red blood cells or hemoglobin in the blood is below normal. Hemoglobin is a substance in red blood cells that carries oxygen to the tissues of the body. Anemia results in not enough oxygen reaching these tissues.  CAUSES  Common causes of anemia include:   Excessive bleeding. Bleeding may be internal or external. This includes excessive bleeding from periods (in women) or from the intestine.   Poor nutrition.   Chronic kidney, thyroid, and liver disease.  Bone marrow disorders that decrease red blood cell production.  Cancer and treatments for cancer.  HIV, AIDS, and their treatments.  Spleen problems that increase red blood cell destruction.  Blood disorders.  Excess destruction of red blood cells due to infection, medicines, and autoimmune disorders. SIGNS AND SYMPTOMS   Minor weakness.   Dizziness.   Headache.  Palpitations.   Shortness of breath, especially with exercise.   Paleness.  Cold sensitivity.  Indigestion.  Nausea.  Difficulty sleeping.  Difficulty concentrating. Symptoms may occur suddenly or they may develop slowly.  DIAGNOSIS  Additional blood tests are often needed. These help your health care provider determine the best treatment. Your health care provider will check your stool for blood and look for other causes of blood loss.  TREATMENT  Treatment varies depending on the cause of the anemia. Treatment can include:   Supplements of iron, vitamin B12, or folic acid.   Hormone medicines.   A blood transfusion. This may be needed if blood loss is severe.   Hospitalization. This may be needed if there is significant continual blood loss.   Dietary changes.  Spleen removal. HOME CARE INSTRUCTIONS Keep all follow-up appointments. It often takes many weeks to correct anemia, and having your health care provider check on your condition and your response to  treatment is very important. SEEK IMMEDIATE MEDICAL CARE IF:   You develop extreme weakness, shortness of breath, or chest pain.   You become dizzy or have trouble concentrating.  You develop heavy vaginal bleeding.   You develop a rash.   You have bloody or black, tarry stools.   You faint.   You vomit up blood.   You vomit repeatedly.   You have abdominal pain.  You have a fever or persistent symptoms for more than 2-3 days.   You have a fever and your symptoms suddenly get worse.   You are dehydrated.  MAKE SURE YOU:  Understand these instructions.  Will watch your condition.  Will get help right away if you are not doing well or get worse.   This information is not intended to replace advice given to you by your health care provider. Make sure you discuss any questions you have with your health care provider.   Document Released: 10/12/2004 Document Revised: 05/07/2013 Document Reviewed: 02/28/2013 Elsevier Interactive Patient Education 2016 [employer]  

## 2015-08-05 NOTE — Progress Notes (Signed)
Addendum  Initial plan was to discharge patient home pending PT evaluation. Physical therapy recommends SNF which patient is agreeable to. Social worker consulted and we'll await SNF bed availability.  Vernell Leep, MD, FACP, FHM. [facility] Pager [phone]  If 7PM-7AM, please contact night-coverage [email or web address] Password [email or web address] 08/05/2015, 3:19 PM

## 2015-08-05 NOTE — Progress Notes (Signed)
PT Cancellation Note  Patient Details Name: Rachel Palmer MRN: [id number] DOB: 05-16-1941   Cancelled Treatment:    Reason Eval/Treat Not Completed: Attempted PT eval-pt currently eating lunch. Will check back. Thanks.   Weston Anna, MPT Pager: [phone]

## 2015-08-05 NOTE — Procedures (Signed)
Technically successful CT guided bone marrow aspiration and biopsy of left iliac crest. No immediate complications.    SignedSandi Mariscal [phone] 08/05/2015, 8:51 AM

## 2015-08-05 NOTE — Discharge Summary (Signed)
Physician Discharge Summary  CLARKE AMBURN EOF:[id number] DOB: August 17, 1941 DOA: 07/29/2015  PCP: Maylon Peppers, MD  Admit date: 07/29/2015 Discharge date: 08/05/2015  Time spent: Greater than 30 minutes  Recommendations for Outpatient Follow-up:  1. [facility]/PCP on 08/24/15 at 10:45 AM with repeat labs (CBC & BMP). 2. Recommend nephrology consultation and follow-up as outpatient. 3. Dr. Burney Gauze, Hematology in one week with repeat labs (CBC & BMP).  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  Filed Weights   07/29/15 1707 07/29/15 2255  Weight: 89.359 kg (197 lb) 90.039 kg (198 lb 8 oz)    History of present illness:  74 year old female with history of hemoglobin [place] disease, chronic anemia, stage III/IV chronic kidney disease, gout and 2 DM, HTN, recently returned to the [place] area from [place], had outpatient follow-up with Dr. Marin Olp, hematologist, complained of fatigue, dizziness and weakness off 3-4 days duration. In the office, hemoglobin found to be 5.2 and patient was referred to ED for admission. No symptoms to suggest bleeding from anywhere. Reported that she had a colonoscopy 3 years ago and benign polyps were removed.  Hospital Course:   Anemia - Unclear etiology. - Hematology consultation and follow-up appreciated. Dr. Marin Olp reviewed her records from [place] and her hemoglobin apparently was pretty stable. He does not think that this is from sickle cell. - No symptoms to suggest bleeding as etiology. -  Status post dose of Procrit and IV iron  - After 2 units of PRBCs, hemoglobin has improved to 8.8 g per DL. Difficult to find appropriate blood for patient due to multiple antibodies. - Hematology requested and interventional radiology performed a bone marrow biopsy on 11/17. Hematology suspecting that patient may have underlying bone marrow disease as etiology for anemia. Dr. Marin Olp has cleared patient for discharge home after bone marrow biopsy today. He will follow-up the results of the same and labs. - May consider outpatient GI consultation, follow-up and GI evaluation as deemed necessary.  Acute on Stage III chronic kidney disease  - Last creatinine prior to admission: 1.56 on 02/13/13. Admitted with creatinine of 2.36. - Creatinine has improved and plateaued off in the 1.6 range which may be her baseline. Acute renal failure resolved.  Thrombocytopenia Seems chronic impaired to her lab work in 2014. Relatively stable and no bleeding reported.   Type 2 DM with renal complications - At home patient takes 70/30 insulin-35 units a.m./38 units p.m. - In the hospital she is on Lantus 26 units twice a day along with NovoLog SSI - Hemoglobin A1c: 5.3 suggesting good outpatient control. Continue prior home dose of insulin's which can be adjusted by PCP as deemed necessary.  Essential hypertension  - Fluctuating and mildly uncontrolled at times. DC HCTZ & ACEI secondary to advanced chronic kidney disease. Continue metoprolol. May have to add a second agent during outpatient follow-up if her blood pressures remain persistently markedly elevated and may consider amlodipine.  Non-anion gap metabolic acidosis - Probably related to chronic kidney disease. May consider starting bicarbonate supplements orally as  outpatient. Recommend nephrology consultation as outpatient.  Gout - Continue allopurinol  Asthma - Stable   Hemoglobin [place] disease - Management per hematology. Not in acute crisis.  Patient [facility] care needs.  Fatty liver - Seen on ultrasound  Hepatic cysts - As per ultrasound abdomen, 2 probable hepatic cysts in the left hepatic lobe one of which is complex/septated mildly increased from 2011, but still likely benign. Outpatient follow-up and evaluation as deemed necessary.  Splenomegaly - Seen on abdominal ultrasound.    Consultants:  Hematology  Procedures:  Bone marrow biopsy 11/17   Discharge Exam:  Complaints: Denies complaints. As per nursing, no acute issues.  Filed Vitals:   08/05/15 0818 08/05/15 0822 08/05/15 1000 08/05/15 1200  BP: 154/77 157/88 146/67   Pulse: 76 73 68   Temp:   98.3 F (36.8 C)   TempSrc:      Resp: _0 Height:      Weight:      SpO2: 100% 100% 100% 100%     General exam: Pleasant elderly female lying comfortably in bed. Patient was seen after bone marrow biopsy. Respiratory system: Clear. No increased work of breathing. Cardiovascular system: S1 & S2 heard, RRR. No JVD, murmurs, gallops, clicks or pedal edema. Telemetry: Sinus rhythm  Gastrointestinal system: Abdomen is nondistended, soft and nontender. Normal bowel sounds heard. Central nervous system: Alert and oriented. No focal neurological deficits. Extremities: Symmetric 5 x 5 power.  Discharge Instructions      Discharge Instructions    Call MD for:  difficulty breathing, headache or visual disturbances    Complete by:  As directed      Call MD for:  extreme fatigue    Complete by:  As directed      Call MD for:  persistant dizziness or light-headedness    Complete by:  As directed      Call MD for:  persistant nausea and vomiting    Complete by:  As directed      Call MD for:  redness, tenderness, or signs of infection (pain, swelling, redness, odor or green/yellow discharge around incision site)    Complete by:  As directed      Call MD for:  severe uncontrolled pain    Complete by:   As directed      Call MD for:  temperature >100.4    Complete by:  As directed      Diet - low sodium heart healthy    Complete by:  As directed      Diet Carb Modified    Complete by:  As directed      Increase activity slowly    Complete by:  As directed             Medication List    STOP taking these medications        hydrochlorothiazide 12.5 MG capsule  Commonly known as:  MICROZIDE     ramipril 10 MG capsule  Commonly known as:  ALTACE      TAKE these medications        albuterol 108 (90 BASE) MCG/ACT inhaler  Commonly known as:  PROVENTIL HFA;VENTOLIN HFA  Inhale 2 puffs into the lungs every 6 (six) hours as needed for wheezing or shortness of breath.     allopurinol 100 MG tablet  Commonly known as:  ZYLOPRIM  Take 100 mg by mouth every other day.  cetirizine 10 MG tablet  Commonly known as:  ZYRTEC  Take 10 mg by mouth daily.     ergocalciferol 50000 UNITS capsule  Commonly known as:  VITAMIN D2  Take 50,000 Units by mouth once a week. Tuesday     Fluticasone-Salmeterol 500-50 MCG/DOSE Aepb  Commonly known as:  ADVAIR  Inhale 1 puff into the lungs 2 (two) times daily.     folic acid 1 MG tablet  Commonly known as:  FOLVITE  Take 2 tablets (2 mg total) by mouth daily.     furosemide 20 MG tablet  Commonly known as:  LASIX  Take 20 mg by mouth as needed for fluid.     insulin aspart protamine- aspart (70-30) 100 UNIT/ML injection  Commonly known as:  NOVOLOG MIX 70/30  Inject 35-38 Units into the skin 2 (two) times daily with a meal. 35 in the morning and 38 in the evening     montelukast 10 MG tablet  Commonly known as:  SINGULAIR  Take 10 mg by mouth every morning.     multivitamin with minerals Tabs tablet  Take 1 tablet by mouth daily.     omeprazole 40 MG capsule  Commonly known as:  PRILOSEC  Take 40 mg by mouth daily.     oxycodone 5 MG capsule  Commonly known as:  OXY-IR  Take 5 mg by mouth every 4 (four) hours as needed for  pain.     sertraline 100 MG tablet  Commonly known as:  ZOLOFT  Take 100 mg by mouth daily.     TOPROL XL PO  Take 100 mg by mouth daily.       Follow-up Information    Follow up with [facility].   Why:  Appointment Dec. 6 at 10:45 AM. To be seen with repeat labs (CBC & BMP).   Contact information:   [place] [id number]       Follow up with Volanda Napoleon, MD. Schedule an appointment as soon as possible for a visit in 1 week.   Specialty:  Oncology   Why:  To be seen with repeat labs (CBC & BMP).   Contact information:   [place], [address] [place] [address] [phone]        The results of significant diagnostics from this hospitalization (including imaging, microbiology, ancillary and laboratory) are listed below for reference.    Significant Diagnostic Studies: US Abdomen Complete  07/30/2015  CLINICAL DATA:  Splenomegaly EXAM: ULTRASOUND ABDOMEN COMPLETE COMPARISON:  Abdominal ultrasound dated 06/22/2011. CT abdomen pelvis dated 02/04/2010. FINDINGS: Gallbladder: Surgically absent. Common bile duct: Diameter: 4 mm Liver: Hyperechoic hepatic parenchyma, suggesting hepatic steatosis. 2.0 x 1.9 x 2.0 cm complex/septated probable cyst in the lateral segment left hepatic lobe, previously 1.5 cm in 2011. Additional 1.3 x 1.0 x 0.9 cm simple cyst. IVC: No abnormality visualized. Pancreas: Visualized portion unremarkable. Spleen: Enlarged, measuring 15.5 x 16.5 x 6.5 cm (calculated volume 869 mL), decreased (previously calculated volume 1215 mL). Right Kidney: Length: 9.6 cm. Echogenic renal parenchyma with cortical thinning/ atrophy. No hydronephrosis. Left Kidney: Length: 9.5 cm. Echogenic renal parenchyma with cortical thinning/ atrophy. No hydronephrosis. Abdominal aorta: No aneurysm visualized. Other findings: None. IMPRESSION: Hepatic steatosis. Two probable hepatic cysts in the left hepatic lobe, one of which  is complex/ septated and mildly increased from 2011, but still likely benign. Splenomegaly, measuring up to 16.5 cm (calculated volume 869 mL), decreased from  2012. Status post cholecystectomy. Bilateral renal atrophy with suspected medical renal disease. No hydronephrosis. Electronically Signed   By: Julian Hy M.D.   On: 07/30/2015 08:20   Ct Biopsy  08/05/2015  INDICATION: History of sickle cell disease, now with anemia. Please perform CT-guided bone marrow biopsy and aspiration for tissue diagnostic purposes. EXAM: CT-GUIDED BONE MARROW BIOPSY AND ASPIRATION MEDICATIONS: Fentanyl 50 mcg IV; Versed 4 mg IV ANESTHESIA/SEDATION: Sedation Time 17 minutes CONTRAST:  None COMPLICATIONS: None immediate. PROCEDURE: Informed consent was obtained from the patient following an explanation of the procedure, risks, benefits and alternatives. The patient understands, agrees and consents for the procedure. All questions were addressed. A time out was performed prior to the initiation of the procedure. The patient was positioned prone and non-contrast localization CT was performed of the pelvis to demonstrate the iliac marrow spaces. The operative site was prepped and draped in the usual sterile fashion. Under sterile conditions and local anesthesia, a 22 gauge spinal needle was utilized for procedural planning. Next, an 11 gauge coaxial bone biopsy needle was advanced into the left iliac marrow space. Needle position was confirmed with CT imaging. Initially, bone marrow aspiration was performed. Next, a bone marrow biopsy was obtained with the 11 gauge outer bone marrow device. Samples were prepared with the cytotechnologist and deemed adequate. The needle was removed intact. Hemostasis was obtained with compression and a dressing was placed. The patient tolerated the procedure well without immediate post procedural complication. IMPRESSION: Successful CT guided left iliac bone marrow aspiration and core biopsies.  Electronically Signed   By: Sandi Mariscal M.D.   On: 08/05/2015 10:32    Microbiology: No results found for this or any previous visit (from the past 240 hour(s)).   Labs: Basic Metabolic Panel:  Recent Labs Lab 07/29/15 1459 07/29/15 1725 07/30/15 0430 08/03/15 0600 08/04/15 0516  NA 135 138 139 141 140  K 4.5 4.5 4.3 4.5 4.5  CL 110 115* 116* 115* 115*  CO2 16* 15* 16* 18* 18*  GLUCOSE 325* 231* 248* 193* 217*  BUN 48* 48* 41* 32* 37*  CREATININE 2.27* 2.36* 2.20* 1.68* 1.66*  CALCIUM 8.4* 8.5* 8.1* 8.6* 8.7*   Liver Function Tests:  Recent Labs Lab 07/29/15 1459 07/29/15 1725 07/30/15 0430 08/03/15 0600 08/04/15 0516  AST 35 42* _0 ALT 12 16 12* 13* 10*  ALKPHOS 100 98 78 82 79  BILITOT 1.2 1.4* 1.2 1.6* 1.3*  PROT 7.3 7.6 6.5 6.9 6.5  ALBUMIN 4.4 4.3 3.6 3.7 3.6   No results for input(s): LIPASE, AMYLASE in the last 168 hours. No results for input(s): AMMONIA in the last 168 hours. CBC:  Recent Labs Lab 07/29/15 1459 07/29/15 1725 08/02/15 0500 08/02/15 2335 08/04/15 0616 08/05/15 0515  WBC 12.6 Corrected for nRBC* 12.3* 10.7* 9.5 7.8 7.6  NEUTROABS 8.7* 6.8  --   --  4.5 3.9  HGB 5.1* 5.2* 4.6* 5.9* 6.8* 8.8*  HCT 15.5* 16.2* 14.4* 17.8* 21.2* 26.8*  MCV 67* 66.4* 68.6* 71.2* 73.1* 73.4*  PLT 51* 63* 64* 54* 54* 55*   Cardiac Enzymes: No results for input(s): CKTOTAL, CKMB, CKMBINDEX, TROPONINI in the last 168 hours. BNP: BNP (last 3 results) No results for input(s): BNP in the last 8760 hours.  ProBNP (last 3 results) No results for input(s): PROBNP in the last 8760 hours.  CBG:  Recent Labs Lab 08/04/15 1150 08/04/15 1659 08/04/15 2153 08/05/15 0730 08/05/15 1204  GLUCAP 245* 163* 169* 154* 217*  Additional labs: 1. Iron panel: Iron 62, TIBC 305, saturation ratios 20 and ferritin 822. 2. Reticulocytes: 299 3. Hemoglobin A1c: 5.3 & 5.6.   Signed:  Vernell Leep, MD, FACP, FHM. [facility] Pager  [phone]  If 7PM-7AM, please contact night-coverage [email or web address] Password [email or web address] 08/05/2015, 1:32 PM

## 2015-08-05 NOTE — Evaluation (Signed)
Physical Therapy Evaluation Patient Details Name: Rachel Palmer MRN: [id number] DOB: Mar 11, 1941 Today's Date: 08/05/2015   History of Present Illness  74 yo female admitted with anemia. Hx of sickle cell, chronic anemia, DM, HTN, CKD, gout  Clinical Impression  On eval, pt required Min assist for mobility-walked ~50'x2 (once with cane, once with RW). Pt is generally weak and unsteady. Fatigues fairly easily with mobility. Dyspnea 2/4 with ambulation-rest breaks needed. Discussed d/c plan-pt states she does not have help available at home and she barely has room to maneuver in her apartment. At this time, do not feel pt could manage safely and efficiently with ADLs and mobility at home alone (pt does not think she can either). Pt would like to discuss rehab options. Made RN aware and requested SW consult to see if ST rehab at SNF is a possibility.     Follow Up Recommendations SNF    Equipment Recommendations  Rolling walker with 5" wheels    Recommendations for Other Services       Precautions / Restrictions Precautions Precautions: Fall Restrictions Weight Bearing Restrictions: No      Mobility  Bed Mobility Overal bed mobility: Modified Independent             General bed mobility comments: Increased time.   Transfers Overall transfer level: Needs assistance Equipment used: Rolling walker (2 wheeled) Transfers: Sit to/from Stand Sit to Stand: Min assist         General transfer comment: Assist to rise, stabilize, control descent. VCs safety, hand placement. Multiple attempts and assist to rise from bed surface  Ambulation/Gait Ambulation/Gait assistance: Min assist Ambulation Distance (Feet): 50 Feet (x2) Assistive device: Rolling walker (2 wheeled);Straight cane Gait Pattern/deviations: Step-through pattern;Antalgic     General Gait Details: walked ~50' x1 with cane- unsteady, pt reaching out for objects to hold on to with opposite hand. switched to  RW-improved stability but still required Min assist to stabilize intemittently. 2 brief standing rest breaks needed. VCs for pursed lip breathing. O2 sats 98%, HR 88 bpm  Stairs            Wheelchair Mobility    Modified Rankin (Stroke Patients Only)       Balance Overall balance assessment: Needs assistance         Standing balance support: During functional activity Standing balance-Leahy Scale: Poor                               Pertinent Vitals/Pain Pain Assessment: Faces Faces Pain Scale: Hurts even more Pain Location: R hip Pain Descriptors / Indicators: Sore Pain Intervention(s): Limited activity within patient's tolerance;Repositioned    Home Living Family/patient expects to be discharged to:: Private residence Living Arrangements: Alone   Type of Home: Apartment Home Access: Level entry     Home Layout: One level Home Equipment: Cane - single point      Prior Function Level of Independence: Independent with assistive device(s)         Comments: uses cane. used rollator until it was destroyed in Physiological scientist        Extremity/Trunk Assessment   Upper Extremity Assessment: Generalized weakness           Lower Extremity Assessment: Generalized weakness      Cervical / Trunk Assessment: Normal  Communication   Communication: No difficulties  Cognition Arousal/Alertness: Awake/alert Behavior During Therapy: WFL for tasks  assessed/performed Overall Cognitive Status: Within Functional Limits for tasks assessed                      General Comments      Exercises        Assessment/Plan    PT Assessment Patient needs continued PT services  PT Diagnosis Difficulty walking;Generalized weakness;Acute pain   PT Problem List Decreased strength;Decreased activity tolerance;Decreased balance;Decreased knowledge of use of DME;Pain;Decreased mobility  PT Treatment Interventions DME instruction;Gait  training;Functional mobility training;Patient/family education;Therapeutic activities;Balance training;Therapeutic exercise   PT Goals (Current goals can be found in the Care Plan section) Acute Rehab PT Goals Patient Stated Goal: to get stronger PT Goal Formulation: With patient Time For Goal Achievement: 08/19/15 Potential to Achieve Goals: Good    Frequency Min 3X/week   Barriers to discharge        Co-evaluation               End of Session Equipment Utilized During Treatment: Gait belt Activity Tolerance: Patient limited by fatigue;Patient limited by pain Patient left: in chair;with call bell/phone within reach           Time: 1422-1452 PT Time Calculation (min) (ACUTE ONLY): 30 min   Charges:   PT Evaluation $Initial PT Evaluation Tier I: 1 Procedure PT Treatments $Gait Training: 8-22 mins   PT G Codes:        Weston Anna, MPT Pager: [phone]

## 2015-08-05 NOTE — Progress Notes (Signed)
Appointment on 08/24/15 at 10:45 AM with [facility].

## 2015-08-05 NOTE — NC FL2 (Signed)
[place] LEVEL OF CARE SCREENING TOOL     IDENTIFICATION  Patient Name: Rachel Palmer Birthdate: September 29, 1940 Sex: female Admission Date (Current Location): 07/29/2015  [facility] and [place] Number: Company secretary and Address:  [facility],  [place] [address], [place]      Provider Number: [id number]  Attending Physician Name and Address:  Modena Jansky, MD  Relative Name and Phone Number:       Current Level of Care: Hospital Recommended Level of Care: [facility] Prior Approval Number:    Date Approved/Denied:   PASRR Number: [id number]  Discharge Plan: SNF    Current Diagnoses: Patient Active Problem List   Diagnosis Date Noted  . Splenomegaly   . Microcytic anemia   . Essential hypertension   . Stage III chronic kidney disease   . Anemia in chronic kidney disease 07/29/2015  . Excess or deficiency of vitamin D 07/26/2012  . Multinodular goiter 07/25/2012  . Enlargement of spleen 07/25/2012  . Thrombocytopenia ([place]) 07/25/2012  . Airway hyperreactivity 03/26/2012  . Obstructive apnea 03/26/2012  . Arthritis of knee 12/18/2011  . Gonalgia 12/18/2011  . Chronic kidney disease (CKD), stage IV (severe) ([place]) 08/18/2011  . Type 2 diabetes mellitus ([place]) 08/18/2011  . Acid reflux 08/18/2011  . BP (high blood pressure) 08/18/2011  . 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 Anemia, chronic renal failure 08/04/2011  . Sickle cell disease, type [place] ([place]) 08/04/2011    Orientation ACTIVITIES/SOCIAL BLADDER RESPIRATION    Time, Self, Situation, Place  Active Continent Normal  BEHAVIORAL SYMPTOMS/MOOD NEUROLOGICAL BOWEL NUTRITION STATUS      Continent Diet (Heart healthy and diabetic diet.)  PHYSICIAN VISITS COMMUNICATION OF NEEDS Height & Weight Skin    Verbally 5\' 5"  (165.1 cm) 198 lbs. Normal          AMBULATORY STATUS RESPIRATION    Supervision limited (Min Assist) Normal       Personal Care Assistance Level of Assistance  Bathing, Feeding, Dressing Bathing Assistance: Limited assistance (supervision) Feeding assistance: Independent Dressing Assistance: Limited assistance (supervision)      Functional Limitations Info  Sight, Hearing, Speech Sight Info: Adequate Hearing Info: Impaired Speech Info: Adequate       SPECIAL CARE FACTORS FREQUENCY  PT (By licensed PT), OT (By licensed OT)     PT Frequency: 5x week OT Frequency: 5x week           Additional Factors Info  Insulin Sliding Scale Code Status Info: FULL Code Staus Allergies Info: Codeine, Heparin Cross Reactors, Iohexol, Ivp Dye, Keflex, Propoxyphene, Tape   Insulin Sliding Scale Info: 2 x a day       Current Medications (08/05/2015): Current Facility-Administered Medications  Medication Dose Route Frequency Provider Last Rate Last Dose  . acetaminophen (TYLENOL) tablet 650 mg  650 mg Oral Q6H PRN Edwin Dada, MD       Or  . acetaminophen (TYLENOL) suppository 650 mg  650 mg Rectal Q6H PRN Edwin Dada, MD      . folic acid (FOLVITE) tablet 2 mg  2 mg Oral Daily Volanda Napoleon, MD   2 mg at 08/05/15 1007  . insulin aspart (novoLOG) injection 0-15 Units  0-15 Units Subcutaneous TID WC Edwin Dada, MD   5 Units at 08/05/15 1249  . insulin aspart (novoLOG) injection 0-5 Units  0-5 Units Subcutaneous QHS Edwin Dada, MD   2 Units at  08/03/15 2138  . insulin aspart (novoLOG) injection 3 Units  3 Units Subcutaneous TID WC Modena Jansky, MD   3 Units at 08/05/15 1248  . insulin glargine (LANTUS) injection 26 Units  26 Units Subcutaneous BID [name], MD   26 Units at 08/05/15 1005  . metoprolol succinate (TOPROL-XL) 24 hr tablet 100 mg  100 mg Oral Daily Edwin Dada, MD   100 mg at 08/05/15 1002  . mometasone-formoterol (DULERA) 200-5 MCG/ACT inhaler 2 puff  2 puff Inhalation BID Edwin Dada, MD   2 puff at 08/04/15 2051  .  montelukast (SINGULAIR) tablet 10 mg  10 mg Oral q morning - 10a Edwin Dada, MD   10 mg at 08/05/15 1002  . oxyCODONE (Oxy IR/ROXICODONE) immediate release tablet 5 mg  5 mg Oral Q4H PRN Edwin Dada, MD   5 mg at 08/04/15 2115  . pantoprazole (PROTONIX) EC tablet 40 mg  40 mg Oral Daily Edwin Dada, MD   40 mg at 08/05/15 1002  . sertraline (ZOLOFT) tablet 100 mg  100 mg Oral Daily Edwin Dada, MD   100 mg at 08/05/15 1005  . vitamin A & D ointment   Topical PRN Pollie Friar, PA-C   5 application at [phone]  . Vitamin D (Ergocalciferol) (DRISDOL) capsule 50,000 Units  50,000 Units Oral Weekly Edwin Dada, MD   50,000 Units at 08/03/15 [id number]   Do not use this list as official medication orders. Please verify with discharge summary.  Discharge Medications:   Medication List    STOP taking these medications        hydrochlorothiazide 12.5 MG capsule  Commonly known as:  MICROZIDE     ramipril 10 MG capsule  Commonly known as:  ALTACE      TAKE these medications        albuterol 108 (90 BASE) MCG/ACT inhaler  Commonly known as:  PROVENTIL HFA;VENTOLIN HFA  Inhale 2 puffs into the lungs every 6 (six) hours as needed for wheezing or shortness of breath.     allopurinol 100 MG tablet  Commonly known as:  ZYLOPRIM  Take 100 mg by mouth every other day.     cetirizine 10 MG tablet  Commonly known as:  ZYRTEC  Take 10 mg by mouth daily.     ergocalciferol 50000 UNITS capsule  Commonly known as:  VITAMIN D2  Take 50,000 Units by mouth once a week. Tuesday     Fluticasone-Salmeterol 500-50 MCG/DOSE Aepb  Commonly known as:  ADVAIR  Inhale 1 puff into the lungs 2 (two) times daily.     folic acid 1 MG tablet  Commonly known as:  FOLVITE  Take 2 tablets (2 mg total) by mouth daily.     furosemide 20 MG tablet  Commonly known as:  LASIX  Take 20 mg by mouth as needed for fluid.     insulin aspart protamine- aspart (70-30)  100 UNIT/ML injection  Commonly known as:  NOVOLOG MIX 70/30  Inject 35-38 Units into the skin 2 (two) times daily with a meal. 35 in the morning and 38 in the evening     montelukast 10 MG tablet  Commonly known as:  SINGULAIR  Take 10 mg by mouth every morning.     multivitamin with minerals Tabs tablet  Take 1 tablet by mouth daily.     omeprazole 40 MG capsule  Commonly known as:  PRILOSEC  Take 40  mg by mouth daily.     oxycodone 5 MG capsule  Commonly known as:  OXY-IR  Take 5 mg by mouth every 4 (four) hours as needed for pain.     sertraline 100 MG tablet  Commonly known as:  ZOLOFT  Take 100 mg by mouth daily.     TOPROL XL PO  Take 100 mg by mouth daily.        Relevant Imaging Results:  Relevant Lab Results:  Recent Labs    Additional Information SSN: [id number]  Marilea Gwynne A, LCSW

## 2015-08-05 NOTE — Clinical Social Work Placement (Signed)
   CLINICAL SOCIAL WORK PLACEMENT  NOTE  Date:  08/05/2015  Patient Details  Name: Rachel Palmer MRN: [id number] Date of Birth: 14-Sep-1941  Clinical Social Work is seeking post-discharge placement for this patient at the [place] level of care (*CSW will initial, date and re-position this form in  chart as items are completed):  Yes   Patient/family provided with [place] Work Department's list of facilities offering this level of care within the geographic area requested by the patient (or if unable, by the patient's family).  Yes   Patient/family informed of their freedom to choose among providers that offer the needed level of care, that participate in Medicare, Medicaid or managed care program needed by the patient, have an available bed and are willing to accept the patient.  Yes   Patient/family informed of [facility]'s ownership interest in [facility] and [facility], as well as of the fact that they are under no obligation to receive care at these facilities.  PASRR submitted to EDS on 08/05/15     PASRR number received on 08/05/15     Existing PASRR number confirmed on       FL2 transmitted to all facilities in geographic area requested by pt/family on 08/05/15     FL2 transmitted to all facilities within larger geographic area on       Patient informed that his/her managed care company has contracts with or will negotiate with certain facilities, including the following:            Patient/family informed of bed offers received.  Patient chooses bed at       Physician recommends and patient chooses bed at      Patient to be transferred to   on  .  Patient to be transferred to facility by       Patient family notified on   of transfer.  Name of family member notified:        PHYSICIAN Please sign FL2     Additional Comment:    _______________________________________________ Ladell Pier, LCSW 08/05/2015,  3:31 PM [phone]

## 2015-08-05 NOTE — Clinical Social Work Note (Cosign Needed)
Clinical Social Work Assessment  Patient Details  Name: Rachel Palmer MRN: [id number] Date of Birth: [birth year]  Date of referral:  08/05/15               Reason for consult:  Facility Placement                Permission sought to share information with:    Permission granted to share information::  Yes, Verbal Permission Granted  Name::        Agency::  [facility] SNF search  Relationship::     Contact Information:     Housing/Transportation Living arrangements for the past 2 months:  [place] of Information:  Patient Patient Interpreter Needed:  None Criminal Activity/Legal Involvement Pertinent to Current Situation/Hospitalization:  No - Comment as needed Significant Relationships:  Spouse Lives with:  Self Do you feel safe going back to the place where you live?  No Need for family participation in patient care:  No (Coment)  Care giving concerns:  Pt lives at home alone. PT recommending short-term rehab at [facility].   Social Worker assessment / plan:  CSW received referral for new SNF for pt.   CSW and BSW Intern met with pt at bedside. Pt agreeable to [facility] search. Pt inquired about transportation resources once she leaves the facility. CSW to provide pt a list of resources regarding transportation.   Pt discussed past expierence with a SNF in [place], [place]. Pt concerned of appropriate medications and services at a facility. CSW reassured pt and will provide pt with list of appointments and medications to take to facility upon dc.   CSW sent pt information via hub to [facility].  Anticipate dc tomorrow to SNF.  CSW to continue to follow and provide support to pt.  Employment status:  Retired Forensic scientist:  Medicare PT Recommendations:  [place] / Referral to community resources:  [place]  Patient/Family's Response to care:  Pt alert and oriented x4. Pt involved in  conversation.  Patient/Family's Understanding of and Emotional Response to Diagnosis, Current Treatment, and Prognosis:  Pt eager to begin short-term rehab at SNF.    Emotional Assessment Appearance:  Appears stated age Attitude/Demeanor/Rapport:  Reactive, Other (Appropriate) Affect (typically observed):  Anxious, Accepting Orientation:  Oriented to Self, Oriented to Place, Oriented to  Time, Oriented to Situation Alcohol / Substance use:  Not Applicable Psych involvement (Current and /or in the community):  No (Comment)  Discharge Needs  Concerns to be addressed:  Discharge Planning Concerns Readmission within the last 30 days:  No Current discharge risk:  None Barriers to Discharge:  Continued Medical Work up   [employer], Student-SW 08/05/2015, 4:08 PM

## 2015-08-05 NOTE — Progress Notes (Signed)
O2 Sats OFF O2 - 100% at rest

## 2015-08-06 DIAGNOSIS — N184 Chronic kidney disease, stage 4 (severe): Secondary | ICD-10-CM

## 2015-08-06 DIAGNOSIS — N183 Chronic kidney disease, stage 3 unspecified: Secondary | ICD-10-CM | POA: Insufficient documentation

## 2015-08-06 DIAGNOSIS — I10 Essential (primary) hypertension: Secondary | ICD-10-CM

## 2015-08-06 DIAGNOSIS — R161 Splenomegaly, not elsewhere classified: Secondary | ICD-10-CM

## 2015-08-06 DIAGNOSIS — D509 Iron deficiency anemia, unspecified: Secondary | ICD-10-CM

## 2015-08-06 DIAGNOSIS — D631 Anemia in chronic kidney disease: Secondary | ICD-10-CM

## 2015-08-06 DIAGNOSIS — N189 Chronic kidney disease, unspecified: Secondary | ICD-10-CM

## 2015-08-06 DIAGNOSIS — E1122 Type 2 diabetes mellitus with diabetic chronic kidney disease: Secondary | ICD-10-CM | POA: Insufficient documentation

## 2015-08-06 LAB — GLUCOSE, CAPILLARY
GLUCOSE-CAPILLARY: 136 mg/dL — AB (ref 65–99)
Glucose-Capillary: 135 mg/dL — ABNORMAL HIGH (ref 65–99)

## 2015-08-06 LAB — CBC WITH DIFFERENTIAL/PLATELET
Basophils Absolute: 0.1 10*3/uL (ref 0.0–0.1)
Basophils Absolute: 0.1 10*3/uL (ref 0.0–0.1)
Basophils Relative: 1 %
Basophils Relative: 1 %
EOS ABS: 0.2 10*3/uL (ref 0.0–0.7)
EOS ABS: 0.1 10*3/uL (ref 0.0–0.7)
EOS PCT: 2 %
Eosinophils Relative: 2 %
HCT: 27.2 % — ABNORMAL LOW (ref 36.0–46.0)
HEMATOCRIT: 26.8 % — AB (ref 36.0–46.0)
HEMOGLOBIN: 8.8 g/dL — AB (ref 12.0–15.0)
HEMOGLOBIN: 8.9 g/dL — AB (ref 12.0–15.0)
LYMPHS ABS: 3 10*3/uL (ref 0.7–4.0)
LYMPHS PCT: 39 %
LYMPHS PCT: 37 %
Lymphs Abs: 2.7 10*3/uL (ref 0.7–4.0)
MCH: 24.1 pg — AB (ref 26.0–34.0)
MCH: 24.2 pg — ABNORMAL LOW (ref 26.0–34.0)
MCHC: 32.8 g/dL (ref 30.0–36.0)
MCHC: 32.7 g/dL (ref 30.0–36.0)
MCV: 73.4 fL — ABNORMAL LOW (ref 78.0–100.0)
MCV: 73.9 fL — ABNORMAL LOW (ref 78.0–100.0)
MONO ABS: 0.6 10*3/uL (ref 0.1–1.0)
MONOS PCT: 8 %
MONOS PCT: 6 %
Monocytes Absolute: 0.4 10*3/uL (ref 0.1–1.0)
NRBC: 8 /100{WBCs} — AB
Neutro Abs: 3.9 10*3/uL (ref 1.7–7.7)
Neutro Abs: 4 10*3/uL (ref 1.7–7.7)
Neutrophils Relative %: 51 %
Neutrophils Relative %: 54 %
PLATELETS: 55 10*3/uL — AB (ref 150–400)
Platelets: 65 10*3/uL — ABNORMAL LOW (ref 150–400)
RBC: 3.65 MIL/uL — ABNORMAL LOW (ref 3.87–5.11)
RBC: 3.68 MIL/uL — AB (ref 3.87–5.11)
RDW: 27.9 % — ABNORMAL HIGH (ref 11.5–15.5)
RDW: 27.8 % — ABNORMAL HIGH (ref 11.5–15.5)
WBC: 7.6 10*3/uL (ref 4.0–10.5)
WBC: 7.3 10*3/uL (ref 4.0–10.5)
nRBC: 3 /100 WBC — ABNORMAL HIGH

## 2015-08-06 LAB — RETICULOCYTES
RBC.: 3.68 MIL/uL — AB (ref 3.87–5.11)
RETIC CT PCT: 7.8 % — AB (ref 0.4–3.1)
Retic Count, Absolute: 287 10*3/uL — ABNORMAL HIGH (ref 19.0–186.0)

## 2015-08-06 LAB — COMPREHENSIVE METABOLIC PANEL
ALBUMIN: 3.7 g/dL (ref 3.5–5.0)
ALT: 11 U/L — ABNORMAL LOW (ref 14–54)
ANION GAP: 7 (ref 5–15)
AST: 18 U/L (ref 15–41)
Alkaline Phosphatase: 82 U/L (ref 38–126)
BILIRUBIN TOTAL: 1.1 mg/dL (ref 0.3–1.2)
BUN: 39 mg/dL — ABNORMAL HIGH (ref 6–20)
CO2: 18 mmol/L — AB (ref 22–32)
Calcium: 8.4 mg/dL — ABNORMAL LOW (ref 8.9–10.3)
Chloride: 115 mmol/L — ABNORMAL HIGH (ref 101–111)
Creatinine, Ser: 1.67 mg/dL — ABNORMAL HIGH (ref 0.44–1.00)
GFR calc Af Amer: 34 mL/min — ABNORMAL LOW (ref >60)
GFR calc non Af Amer: 30 mL/min — ABNORMAL LOW (ref >60)
GLUCOSE: 163 mg/dL — AB (ref 65–99)
POTASSIUM: 4.6 mmol/L (ref 3.5–5.1)
SODIUM: 140 mmol/L (ref 135–145)
TOTAL PROTEIN: 6.8 g/dL (ref 6.5–8.1)

## 2015-08-06 MED ORDER — OXYCODONE HCL 5 MG PO CAPS: 5.0000 mg | ORAL_CAPSULE | ORAL | Status: AC | PRN

## 2015-08-06 NOTE — Progress Notes (Signed)
CSW continuing to follow.   CSW followed up with pt at bedside to provide SNF bed offers. Pt chooses bed at [facility]. CSW contacted facility and awaiting confirmation from facility that facility has bed available.   CSW provided pt with transportation resources per request of pt.   CSW to continue to follow to assist with pt discharge needs this afternoon.  Alison Murray, MSW, [place] Work [phone]

## 2015-08-06 NOTE — Care Management Important Message (Signed)
Important Message  Patient Details  Name: AMITA QUARLES MRN: [id number] Date of Birth: [birth year]   Medicare Important Message Given:  Yes    Shelda Altes 08/06/2015, 12:46 [place] Message  Patient Details  Name: SHAWNTAI GAUR MRN: [id number] Date of Birth: 22-Sep-1940   Medicare Important Message Given:  Yes    Shelda Altes 08/06/2015, 12:45 PM

## 2015-08-06 NOTE — NC FL2 (Signed)
[place] LEVEL OF CARE SCREENING TOOL     IDENTIFICATION  Patient Name: Rachel Palmer Birthdate: Oct 17, 1940 Sex: female Admission Date (Current Location): 07/29/2015  [facility] and [place] Number: Company secretary and Address:  [facility],  [place], [place]      Provider Number: [id number]  Attending Physician Name and Address:  Charlynne Cousins, MD  Relative Name and Phone Number:       Current Level of Care: Hospital Recommended Level of Care: [place] Prior Approval Number:    Date Approved/Denied:   PASRR Number: [id number]  Discharge Plan: SNF    Current Diagnoses: Patient Active Problem List   Diagnosis Date Noted  . Type 2 diabetes mellitus with stage 3 chronic kidney disease ([place])   . Splenomegaly   . Microcytic anemia   . Essential hypertension   . Stage III chronic kidney disease   . Anemia in chronic kidney disease 07/29/2015  . Excess or deficiency of vitamin D 07/26/2012  . Multinodular goiter 07/25/2012  . Enlargement of spleen 07/25/2012  . Thrombocytopenia ([place]) 07/25/2012  . Airway hyperreactivity 03/26/2012  . Obstructive apnea 03/26/2012  . Arthritis of knee 12/18/2011  . Gonalgia 12/18/2011  . Chronic kidney disease (CKD), stage IV (severe) ([place]) 08/18/2011  . Type 2 diabetes mellitus ([place]) 08/18/2011  . Acid reflux 08/18/2011  . BP (high blood pressure) 08/18/2011  . 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 Anemia, chronic renal failure 08/04/2011  . Sickle cell disease, type [place] ([place]) 08/04/2011    Orientation ACTIVITIES/SOCIAL BLADDER RESPIRATION    Time, Self, Situation, Place  Active Continent Normal  BEHAVIORAL SYMPTOMS/MOOD NEUROLOGICAL BOWEL NUTRITION STATUS      Continent Diet (Heart healthy and diabetic diet.)  PHYSICIAN VISITS COMMUNICATION OF NEEDS Height & Weight Skin    Verbally 5\' 5"  (165.1 cm) 198 lbs. Normal          AMBULATORY  STATUS RESPIRATION    Supervision limited (Min Assist) Normal      Personal Care Assistance Level of Assistance  Bathing, Feeding, Dressing Bathing Assistance: Limited assistance (supervision) Feeding assistance: Independent Dressing Assistance: Limited assistance (supervision)      Functional Limitations Info  Sight, Hearing, Speech Sight Info: Adequate Hearing Info: Impaired Speech Info: Adequate       SPECIAL CARE FACTORS FREQUENCY  PT (By licensed PT), OT (By licensed OT)     PT Frequency: 5x week OT Frequency: 5x week           Additional Factors Info  Insulin Sliding Scale Code Status Info: FULL Code Staus Allergies Info: Codeine, Heparin Cross Reactors, Iohexol, Ivp Dye, Keflex, Propoxyphene, Tape   Insulin Sliding Scale Info: 2 x a day       Current Medications (08/06/2015): Current Facility-Administered Medications  Medication Dose Route Frequency Provider Last Rate Last Dose  . acetaminophen (TYLENOL) tablet 650 mg  650 mg Oral Q6H PRN Edwin Dada, MD       Or  . acetaminophen (TYLENOL) suppository 650 mg  650 mg Rectal Q6H PRN Edwin Dada, MD      . folic acid (FOLVITE) tablet 2 mg  2 mg Oral Daily Volanda Napoleon, MD   2 mg at 08/06/15 0904  . insulin aspart (novoLOG) injection 0-15 Units  0-15 Units Subcutaneous TID WC Edwin Dada, MD   2 Units at 08/06/15 [phone]  . insulin aspart (novoLOG) injection 0-5 Units  0-5 Units Subcutaneous QHS Edwin Dada, MD   2 Units at 08/03/15 2138  . insulin aspart (novoLOG) injection 3 Units  3 Units Subcutaneous TID WC Modena Jansky, MD   3 Units at 08/06/15 [phone]  . insulin glargine (LANTUS) injection 26 Units  26 Units Subcutaneous BID [name], MD   26 Units at 08/06/15 1006  . metoprolol succinate (TOPROL-XL) 24 hr tablet 100 mg  100 mg Oral Daily Edwin Dada, MD   100 mg at 08/06/15 0904  . mometasone-formoterol (DULERA) 200-5 MCG/ACT inhaler 2 puff  2 puff  Inhalation BID Edwin Dada, MD   2 puff at 08/06/15 0830  . montelukast (SINGULAIR) tablet 10 mg  10 mg Oral q morning - 10a Edwin Dada, MD   10 mg at 08/06/15 0904  . oxyCODONE (Oxy IR/ROXICODONE) immediate release tablet 5 mg  5 mg Oral Q4H PRN Edwin Dada, MD   5 mg at 08/04/15 2115  . pantoprazole (PROTONIX) EC tablet 40 mg  40 mg Oral Daily Edwin Dada, MD   40 mg at 08/06/15 0904  . sertraline (ZOLOFT) tablet 100 mg  100 mg Oral Daily Edwin Dada, MD   100 mg at 08/06/15 0904  . vitamin A & D ointment   Topical PRN Pollie Friar, PA-C   5 application at [phone]  . Vitamin D (Ergocalciferol) (DRISDOL) capsule 50,000 Units  50,000 Units Oral Weekly Edwin Dada, MD   50,000 Units at 08/03/15 [id number]   Do not use this list as official medication orders. Please verify with discharge summary.  Discharge Medications:   Medication List    STOP taking these medications        hydrochlorothiazide 12.5 MG capsule  Commonly known as:  MICROZIDE     ramipril 10 MG capsule  Commonly known as:  ALTACE      TAKE these medications        albuterol 108 (90 BASE) MCG/ACT inhaler  Commonly known as:  PROVENTIL HFA;VENTOLIN HFA  Inhale 2 puffs into the lungs every 6 (six) hours as needed for wheezing or shortness of breath.     allopurinol 100 MG tablet  Commonly known as:  ZYLOPRIM  Take 100 mg by mouth every other day.     cetirizine 10 MG tablet  Commonly known as:  ZYRTEC  Take 10 mg by mouth daily.     ergocalciferol 50000 UNITS capsule  Commonly known as:  VITAMIN D2  Take 50,000 Units by mouth once a week. Tuesday     Fluticasone-Salmeterol 500-50 MCG/DOSE Aepb  Commonly known as:  ADVAIR  Inhale 1 puff into the lungs 2 (two) times daily.     folic acid 1 MG tablet  Commonly known as:  FOLVITE  Take 2 tablets (2 mg total) by mouth daily.     furosemide 20 MG tablet  Commonly known as:  LASIX  Take 20 mg by  mouth as needed for fluid.     insulin aspart protamine- aspart (70-30) 100 UNIT/ML injection  Commonly known as:  NOVOLOG MIX 70/30  Inject 35-38 Units into the skin 2 (two) times daily with a meal. 35 in the morning and 38 in the evening     montelukast 10 MG tablet  Commonly known as:  SINGULAIR  Take 10 mg by mouth every morning.     multivitamin with minerals Tabs tablet  Take 1 tablet by mouth daily.  omeprazole 40 MG capsule  Commonly known as:  PRILOSEC  Take 40 mg by mouth daily.     oxycodone 5 MG capsule  Commonly known as:  OXY-IR  Take 5 mg by mouth every 4 (four) hours as needed for pain.     sertraline 100 MG tablet  Commonly known as:  ZOLOFT  Take 100 mg by mouth daily.     TOPROL XL PO  Take 100 mg by mouth daily.        Relevant Imaging Results:  Relevant Lab Results:  Recent Labs    Additional Information SSN: [id number]  Wei Newbrough A, LCSW

## 2015-08-06 NOTE — Progress Notes (Signed)
Ms. Rachel Palmer will be going to skilled nursing facility. I think this is a great idea. She is actually excited about this. She knows that she can get stronger before she is able to go home.  She actually feels pretty good. Her blood sugars are getting better. This morning, her blood sugar was 163.  Her hemoglobin is holding steady. It was a 0.9. Hopefully, the Procrit and the iron that we gave her earlier this week is helping a little bit. Her platelet count is 65,000 which is also holding steady.  She had her bone marrow test yesterday. She did well with this. I don't expect any results until early next week.  Her renal function appears to be stabilizing. Her creatinine is 1.67.  She sees me eating a little bit better. She's had no nausea or vomiting.  Her physical exam shows stable vital signs. Her blood pressure is 149/58. Her temperature is 97.8. Her head and neck exam shows no ocular or oral lesions. There are no palpable cervical or supraclavicular lymph nodes. Lungs are clear. Cardiac exam regular rate and rhythm with no murmurs, rubs or bruits. Abdomen is soft. She is mildly obese. Bowel sounds present. She has no palpable liver or spleen tip. Extremities shows some trace edema in her legs. Skin exam shows no rashes.  I'm not sure when she will be going to skilled nursing. We will continue to follow her along. If she is here next week, then I will probably will give her another dose of Procrit.  I appreciate very much the outstanding care that she is gotten from everybody up on [address]!!  Rachel E.  [place] 9:22

## 2015-08-06 NOTE — Clinical Social Work Placement (Signed)
   CLINICAL SOCIAL WORK PLACEMENT  NOTE  Date:  08/06/2015  Patient Details  Name: CILLA BOBBITT MRN: [id number] Date of Birth: 1940-10-02  Clinical Social Work is seeking post-discharge placement for this patient at the [place] level of care (*CSW will initial, date and re-position this form in  chart as items are completed):  Yes   Patient/family provided with [place] Work Department's list of facilities offering this level of care within the geographic area requested by the patient (or if unable, by the patient's family).  Yes   Patient/family informed of their freedom to choose among providers that offer the needed level of care, that participate in Medicare, Medicaid or managed care program needed by the patient, have an available bed and are willing to accept the patient.  Yes   Patient/family informed of [facility]'s ownership interest in [facility] and [facility], as well as of the fact that they are under no obligation to receive care at these facilities.  PASRR submitted to EDS on 08/05/15     PASRR number received on 08/05/15     Existing PASRR number confirmed on       FL2 transmitted to all facilities in geographic area requested by pt/family on 08/05/15     FL2 transmitted to all facilities within larger geographic area on       Patient informed that his/her managed care company has contracts with or will negotiate with certain facilities, including the following:        Yes   Patient/family informed of bed offers received.  Patient chooses bed at [facility]     Physician recommends and patient chooses bed at      Patient to be transferred to   on  .  Patient to be transferred to facility by       Patient family notified on   of transfer.  Name of family member notified:        PHYSICIAN Please sign FL2     Additional Comment:    _______________________________________________ Ladell Pier, LCSW 08/06/2015, 12:08 PM

## 2015-08-06 NOTE — Progress Notes (Signed)
[facility] PROGRESS NOTE    Progress Note   Rachel Palmer TDD:[id number] DOB: 1941/07/07 DOA: 07/29/2015 PCP: Maylon Peppers, MD   Brief Narrative:   Rachel Palmer is an 74 y.o. female past medical history is chronic anemia, chronic kidney disease stage IV. Recent flu caution [place] had a follow-up appointment with her hematologist before fatigue and dizzy for the last 3 days duration in the ED her hemoglobin was found to be 5.7 so she was admitted.  Assessment/Plan:   Anemia in chronic kidney disease/  Sickle cell disease, type [place] ([place]): Hemoglobin is stable to follow-up with him as an outpatient. Recommend getting a ferritin as an outpatient. Skilled nursing facility placement patient is stable exam of changes overnight.  Acute on Chronic kidney disease (CKD), stage IV (severe) (HCC) Creatinine on admission was 2.3 g back to her records in 2014 was 1.6. Creatinine has remained stable and probably her new baseline stable for discharge.  Type 2 diabetes mellitus (HCC) no complications: Her W2B is 5.3 which points towards good control as an outpatient, will continue 70/30 insulin 35 units in the morning and 30 in the evening at home.  Essential hypertension: He will be discharged on metoprolol. Can consider adding ACE inhibitor as an outpatient in 2 weeks.  Nonionic gap metabolic acidosis: [place] glycol and supplementation as an outpatient.  Gout: Continue allopurinol.  Splenomegaly: CL ultrasound follow-up with hematology as an outpatient.    DVT Prophylaxis - Lovenox ordered.  Family Communication: none Disposition Plan: Home when stable. Code Status:     Code Status Orders        Start     Ordered   07/29/15 2343  Full code   Continuous     07/29/15 2346        IV Access:    Peripheral IV   Procedures and diagnostic studies:   Ct Biopsy  08/05/2015  INDICATION: History of sickle cell disease, now with anemia. Please perform  CT-guided bone marrow biopsy and aspiration for tissue diagnostic purposes. EXAM: CT-GUIDED BONE MARROW BIOPSY AND ASPIRATION MEDICATIONS: Fentanyl 50 mcg IV; Versed 4 mg IV ANESTHESIA/SEDATION: Sedation Time 17 minutes CONTRAST:  None COMPLICATIONS: None immediate. PROCEDURE: Informed consent was obtained from the patient following an explanation of the procedure, risks, benefits and alternatives. The patient understands, agrees and consents for the procedure. All questions were addressed. A time out was performed prior to the initiation of the procedure. The patient was positioned prone and non-contrast localization CT was performed of the pelvis to demonstrate the iliac marrow spaces. The operative site was prepped and draped in the usual sterile fashion. Under sterile conditions and local anesthesia, a 22 gauge spinal needle was utilized for procedural planning. Next, an 11 gauge coaxial bone biopsy needle was advanced into the left iliac marrow space. Needle position was confirmed with CT imaging. Initially, bone marrow aspiration was performed. Next, a bone marrow biopsy was obtained with the 11 gauge outer bone marrow device. Samples were prepared with the cytotechnologist and deemed adequate. The needle was removed intact. Hemostasis was obtained with compression and a dressing was placed. The patient tolerated the procedure well without immediate post procedural complication. IMPRESSION: Successful CT guided left iliac bone marrow aspiration and core biopsies. Electronically Signed   By: Sandi Mariscal M.D.   On: 08/05/2015 10:32     Medical Consultants:    None.  Anti-Infectives:   Anti-infectives    None      Subjective:  Rachel Palmer no complaints.  Objective:    Filed Vitals:   08/05/15 2112 08/05/15 2133 08/06/15 0512 08/06/15 0831  BP:  125/62 149/58   Pulse:  66 65 68  Temp:  97.8 F (36.6 C) 97.8 F (36.6 C)   TempSrc:  Oral Oral   Resp:  _0 Height:        Weight:      SpO2: 98% 97% 100% 100%    Intake/Output Summary (Last 24 hours) at 08/06/15 1018 Last data filed at 08/06/15 1000  Gross per 24 hour  Intake    600 ml  Output   1500 ml  Net   -900 ml   Filed Weights   07/29/15 1707 07/29/15 2255  Weight: 89.359 kg (197 lb) 90.039 kg (198 lb 8 oz)    Exam: Gen:  NAD Cardiovascular:  RRR, No M/R/G Chest and lungs:   CTAB Abdomen:  Abdomen soft, NT/ND, + BS Extremities:  No C/E/C   Data Reviewed:    Labs: Basic Metabolic Panel:  Recent Labs Lab 08/03/15 0600 08/04/15 0516 08/06/15 0540  NA 141 140 140  K 4.5 4.5 4.6  CL 115* 115* 115*  CO2 18* 18* 18*  GLUCOSE 193* 217* 163*  BUN 32* 37* 39*  CREATININE 1.68* 1.66* 1.67*  CALCIUM 8.6* 8.7* 8.4*   GFR Estimated Creatinine Clearance: 33.7 mL/min (by C-G formula based on Cr of 1.67). Liver Function Tests:  Recent Labs Lab 08/03/15 0600 08/04/15 0516 08/06/15 0540  AST _1 ALT 13* 10* 11*  ALKPHOS 82 79 82  BILITOT 1.6* 1.3* 1.1  PROT 6.9 6.5 6.8  ALBUMIN 3.7 3.6 3.7   No results for input(s): LIPASE, AMYLASE in the last 168 hours. No results for input(s): AMMONIA in the last 168 hours. Coagulation profile  Recent Labs Lab 08/05/15 0515  INR 1.10    CBC:  Recent Labs Lab 08/02/15 0500 08/02/15 2335 08/04/15 0616 08/05/15 0515 08/06/15 0540  WBC 10.7* 9.5 7.8 7.6 7.3  NEUTROABS  --   --  4.5 3.9 4.0  HGB 4.6* 5.9* 6.8* 8.8* 8.9*  HCT 14.4* 17.8* 21.2* 26.8* 27.2*  MCV 68.6* 71.2* 73.1* 73.4* 73.9*  PLT 64* 54* 54* 55* 65*   Cardiac Enzymes: No results for input(s): CKTOTAL, CKMB, CKMBINDEX, TROPONINI in the last 168 hours. BNP (last 3 results) No results for input(s): PROBNP in the last 8760 hours. CBG:  Recent Labs Lab 08/05/15 0730 08/05/15 1204 08/05/15 1710 08/05/15 2129 08/06/15 0810  GLUCAP 154* 217* 196* 125* 135*   D-Dimer: No results for input(s): DDIMER in the last 72 hours. Hgb A1c: No results for  input(s): HGBA1C in the last 72 hours. Lipid Profile: No results for input(s): CHOL, HDL, LDLCALC, TRIG, CHOLHDL, LDLDIRECT in the last 72 hours. Thyroid function studies: No results for input(s): TSH, T4TOTAL, T3FREE, THYROIDAB in the last 72 hours.  Invalid input(s): FREET3 Anemia work up:  Recent Labs  08/05/15 0515 08/06/15 0540  RETICCTPCT 8.2* 7.8*   Sepsis Labs:  Recent Labs Lab 08/02/15 2335 08/04/15 0616 08/05/15 0515 08/06/15 0540  WBC 9.5 7.8 7.6 7.3   Microbiology No results found for this or any previous visit (from the past 240 hour(s)).   Medications:   . folic acid  2 mg Oral Daily  . insulin aspart  0-15 Units Subcutaneous TID WC  . insulin aspart  0-5 Units Subcutaneous QHS  . insulin aspart  3 Units Subcutaneous TID WC  .  insulin glargine  26 Units Subcutaneous BID  . metoprolol succinate  100 mg Oral Daily  . mometasone-formoterol  2 puff Inhalation BID  . montelukast  10 mg Oral q morning - 10a  . pantoprazole  40 mg Oral Daily  . sertraline  100 mg Oral Daily  . Vitamin D (Ergocalciferol)  50,000 Units Oral Weekly   Continuous Infusions:   Time spent: 15 min   LOS: 8 days   Charlynne Cousins  [facility] Pager [phone]  *Please refer to [email or web address], password [email or web address] to get updated schedule on who will round on this patient, as hospitalists switch teams weekly. If 7PM-7AM, please contact night-coverage at [email or web address], password [email or web address] for any overnight needs.  08/06/2015, 10:18 AM

## 2015-08-06 NOTE — Clinical Social Work Placement (Signed)
   CLINICAL SOCIAL WORK PLACEMENT  NOTE  Date:  08/06/2015  Patient Details  Name: Rachel Palmer MRN: [id number] Date of Birth: Mar 11, 1941  Clinical Social Work is seeking post-discharge placement for this patient at the [place] level of care (*CSW will initial, date and re-position this form in  chart as items are completed):  Yes   Patient/family provided with [place] Work Department's list of facilities offering this level of care within the geographic area requested by the patient (or if unable, by the patient's family).  Yes   Patient/family informed of their freedom to choose among providers that offer the needed level of care, that participate in Medicare, Medicaid or managed care program needed by the patient, have an available bed and are willing to accept the patient.  Yes   Patient/family informed of [facility]'s ownership interest in [facility] and [facility], as well as of the fact that they are under no obligation to receive care at these facilities.  PASRR submitted to EDS on 08/05/15     PASRR number received on 08/05/15     Existing PASRR number confirmed on       FL2 transmitted to all facilities in geographic area requested by pt/family on 08/05/15     FL2 transmitted to all facilities within larger geographic area on       Patient informed that his/her managed care company has contracts with or will negotiate with certain facilities, including the following:        Yes   Patient/family informed of bed offers received.  Patient chooses bed at [facility]     Physician recommends and patient chooses bed at      Patient to be transferred to [facility] on 08/06/15.  Patient to be transferred to facility by ambulance [facility])     Patient family notified on 08/06/15 of transfer.  Name of family member notified:  pt notified at bedside and pt sister notified via telephone      PHYSICIAN Please sign FL2     Additional Comment:    _______________________________________________ Ladell Pier, LCSW 08/06/2015, 3:08 PM

## 2015-08-06 NOTE — Progress Notes (Signed)
CSW received confirmation from [facility] that facility can accept pt today.   CSW facilitated pt discharge needs including contacting facility, faxing pt discharge information, discussing with pt at bedside, notifying pt sister via telephone per pt request, providing RN phone number to call report, and arranging ambulance transport for pt to [facility].  No further social work needs identified at this time.  CSW signing off.   Alison Murray, MSW, [place] Work [phone]

## 2015-08-06 NOTE — Progress Notes (Signed)
Discussed with patient discharge instructions, she verbalized agreement and understanding.  Patient's IV was discontinued with no complications.  Report called to [facility] at [facility].  Patient went by stretcher with all belongings to go by transport to facility.

## 2015-08-10 ENCOUNTER — Non-Acute Institutional Stay (SKILLED_NURSING_FACILITY): Payer: Medicare Other | Admitting: Internal Medicine

## 2015-08-10 ENCOUNTER — Telehealth: Payer: Self-pay | Admitting: Hematology & Oncology

## 2015-08-10 ENCOUNTER — Encounter: Payer: Self-pay | Admitting: Internal Medicine

## 2015-08-10 DIAGNOSIS — E872 Acidosis, unspecified: Secondary | ICD-10-CM | POA: Insufficient documentation

## 2015-08-10 DIAGNOSIS — N183 Chronic kidney disease, stage 3 unspecified: Secondary | ICD-10-CM

## 2015-08-10 DIAGNOSIS — D509 Iron deficiency anemia, unspecified: Secondary | ICD-10-CM

## 2015-08-10 DIAGNOSIS — M1A39X0 Chronic gout due to renal impairment, multiple sites, without tophus (tophi): Secondary | ICD-10-CM

## 2015-08-10 DIAGNOSIS — Z794 Long term (current) use of insulin: Secondary | ICD-10-CM

## 2015-08-10 DIAGNOSIS — D696 Thrombocytopenia, unspecified: Secondary | ICD-10-CM

## 2015-08-10 DIAGNOSIS — I10 Essential (primary) hypertension: Secondary | ICD-10-CM

## 2015-08-10 DIAGNOSIS — M109 Gout, unspecified: Secondary | ICD-10-CM | POA: Insufficient documentation

## 2015-08-10 DIAGNOSIS — E1122 Type 2 diabetes mellitus with diabetic chronic kidney disease: Secondary | ICD-10-CM

## 2015-08-10 DIAGNOSIS — D5720 Sickle-cell/Hb-C disease without crisis: Secondary | ICD-10-CM | POA: Diagnosis not present

## 2015-08-10 NOTE — Telephone Encounter (Signed)
Per Md POF to sch patient for 08/11/15.  Apt was sch and i called patient at [phone] and left Vm of appt date/time and asked patient to call back to confirm

## 2015-08-10 NOTE — Assessment & Plan Note (Addendum)
-   At home patient takes 70/30 insulin-35 units a.m./38 units p.m. - In the hospital she is on Lantus 26 units twice a day along with NovoLog SSI - Hemoglobin A1c: 5.3 suggesting good outpatient control.SNF -  Continue prior home dose of insulin's

## 2015-08-10 NOTE — Assessment & Plan Note (Signed)
-   Probably related to chronic kidney disease. May consider starting bicarbonate supplements orally as outpatient. Recommend nephrology consultation as outpatient.

## 2015-08-10 NOTE — Assessment & Plan Note (Signed)
Fluctuating and mildly uncontrolled at times. DC HCTZ & ACEI secondary to advanced chronic kidney disease. SNF - Continue metoprolol. May have to add a second agent during outpatient follow-up if blood pressures remain persistently markedly elevated; consider amlodipine.

## 2015-08-10 NOTE — Assessment & Plan Note (Signed)
-  Unclear etiology. - Hematology consultation and follow-up appreciated. Dr. Marin Olp reviewed her records from [place] and her hemoglobin apparently was pretty stable. He does not think that this is from sickle cell. - No symptoms to suggest bleeding as etiology. - Status post dose of Procrit and IV iron  - After 2 units of PRBCs, hemoglobin has improved to 8.8 g per DL. Difficult to find appropriate blood for patient due to multiple antibodies. - Hematology requested and interventional radiology performed a bone marrow biopsy on 11/17. Hematology suspecting that patient may have underlying bone marrow disease as etiology for anemia. Dr. Marin Olp has cleared patient for discharge home after bone marrow biopsy today. He will follow-up the results of the same and labs. - May consider outpatient GI consultation, follow-up and GI evaluation as deemed necessary

## 2015-08-10 NOTE — Assessment & Plan Note (Signed)
Management per hematology. Not in acute crisis. Patient wished to follow-up at the [facility] for primary care needs.

## 2015-08-10 NOTE — Assessment & Plan Note (Signed)
Last creatinine prior to admission: 1.56 on 02/13/13. Admitted with creatinine of 2.36. - Creatinine has improved and plateaued off in the 1.6 range which may be her baseline. Acute renal failure resolved.

## 2015-08-10 NOTE — Addendum Note (Signed)
Addended by: Burney Gauze R on: 08/10/2015 07:24 AM   Modules accepted: Orders

## 2015-08-10 NOTE — Assessment & Plan Note (Signed)
SNF - cont allopurinol

## 2015-08-10 NOTE — Progress Notes (Signed)
MRN: [id number] Name: Rachel Palmer  Sex: female Age: 74 y.o. DOB: [birth year]  [facility] #: [facility] Facility/Room:110 Level Of Care: SNF Provider: Merrilee Seashore D Emergency Contacts: Extended Emergency Contact Information Primary Emergency Contact: Furgeson,John T Address: [address]          [place], [place] [address] [place] of [place] Home Phone: [phone] Mobile Phone: [phone] Relation: Spouse  Code Status:   Allergies: Codeine; Heparin cross reactors; Iohexol; Ivp dye; Keflex; Propoxyphene; and Tape  Chief Complaint  Patient presents with  . New Admit To SNF    HPI: Patient is 74 y.o. female whhemoglobin [place] disease, chronic anemia, stage III/IV chronic kidney disease, gout and 2 DM, HTN, recently returned to the [place] area from [place], had outpatient follow-up with Dr. Myna Hidalgo, hematologist, complained of fatigue, dizziness and weakness off 3-4 days duration. In the office, hemoglobin found to be 5.2 and patient was referred to ED for admission. No symptoms to suggest bleeding from anywhere. Reported that she had a colonoscopy 3 years ago and benign polyps were removed.oPt was [facility] from [facility]10[facility]17 where her anemia was w/u and pt was transfused. Course was complicated by acute on chronic renal failure.Pt is admitted to SNF with generalized weakness. While at SNF pt will be followed for HTN, tx with metoprolol, gout, tx with allopurinol and DM2, tx with insulin.  Past Medical History  Diagnosis Date  . Hypertension   . Sickle cell disease (HCC)   . Diabetes mellitus   . Anemia, chronic renal failure 08/04/2011  . Sickle cell disease, type [place] (HCC) 08/04/2011  . Blood transfusion without reported diagnosis   . Fatty liver     Past Surgical History  Procedure Laterality Date  . Cholecystectomy    . Shoulder surgery    . Appendectomy    . Knee surgery        Medication List       This list is accurate as of: 08/10/15 11:59 PM.   Always use your most recent med list.               albuterol 108 (90 BASE) MCG/ACT inhaler  Commonly known as:  PROVENTIL HFA;VENTOLIN HFA  Inhale 2 puffs into the lungs every 6 (six) hours as needed for wheezing or shortness of breath.     allopurinol 100 MG tablet  Commonly known as:  ZYLOPRIM  Take 100 mg by mouth every other day.     cetirizine 10 MG tablet  Commonly known as:  ZYRTEC  Take 10 mg by mouth daily.     ergocalciferol 50000 UNITS capsule  Commonly known as:  VITAMIN D2  Take 50,000 Units by mouth once a week. Tuesday     Fluticasone-Salmeterol 500-50 MCG/DOSE Aepb  Commonly known as:  ADVAIR  Inhale 1 puff into the lungs 2 (two) times daily.     folic acid 1 MG tablet  Commonly known as:  FOLVITE  Take 2 tablets (2 mg total) by mouth daily.     furosemide 20 MG tablet  Commonly known as:  LASIX  Take 20 mg by mouth as needed for fluid.     insulin aspart protamine- aspart (70-30) 100 UNIT/ML injection  Commonly known as:  NOVOLOG MIX 70/30  Inject 35-38 Units into the skin 2 (two) times daily with a meal. 35 in the morning and 38 in the evening     montelukast 10 MG tablet  Commonly known as:  SINGULAIR  Take 10 mg by mouth every morning.     multivitamin with minerals Tabs tablet  Take 1 tablet by mouth daily.     omeprazole 40 MG capsule  Commonly known as:  PRILOSEC  Take 40 mg by mouth daily.     oxycodone 5 MG capsule  Commonly known as:  OXY-IR  Take 1 capsule (5 mg total) by mouth every 4 (four) hours as needed for pain.     sertraline 100 MG tablet  Commonly known as:  ZOLOFT  Take 100 mg by mouth daily.     TOPROL XL PO  Take 100 mg by mouth daily.        No orders of the defined types were placed in this encounter.    Immunization History  Administered Date(s) Administered  . Influenza-Unspecified 06/28/2015    Social History  Substance Use Topics  . Smoking status: Former Research scientist (life sciences)  . Smokeless tobacco: Not on file   . Alcohol Use: No    Family history is + CVA , mom    Review of Systems  DATA OBTAINED: from patient GENERAL:  no fevers, fatigue, appetite changes SKIN: No itching, rash or wounds EYES: No eye pain, redness, discharge EARS: No earache, tinnitus, change in hearing NOSE: No congestion, drainage or bleeding  MOUTH/THROAT: No mouth or tooth pain, No sore throat RESPIRATORY: No cough, wheezing, SOB CARDIAC: No chest pain, palpitations, lower extremity edema  GI: No abdominal pain, No N/V/D or constipation, No heartburn or reflux  GU: No dysuria, frequency or urgency, or incontinence  MUSCULOSKELETAL: No unrelieved bone/joint pain NEUROLOGIC: No headache, dizziness or focal weakness PSYCHIATRIC: No c/o anxiety or sadness   Filed Vitals:   08/10/15 1514  BP: 144/74  Pulse: 77  Temp: 96.7 F (35.9 C)  Resp: 18    SpO2 Readings from Last 1 Encounters:  08/06/15 100%        Physical Exam  GENERAL APPEARANCE: Alert, conversant,  No acute distress.  SKIN: No diaphoresis rash HEAD: Normocephalic, atraumatic  EYES: Conjunctiva/lids clear. Pupils round, reactive. EOMs intact.  EARS: External exam WNL, canals clear. Hearing grossly normal.  NOSE: No deformity or discharge.  MOUTH/THROAT: Lips w/o lesions  RESPIRATORY: Breathing is even, unlabored. Lung sounds are clear   CARDIOVASCULAR: Heart RRR no murmurs, rubs or gallops. No peripheral edema.   GASTROINTESTINAL: Abdomen is soft, non-tender, not distended w/ normal bowel sounds. GENITOURINARY: Bladder non tender, not distended  MUSCULOSKELETAL: No abnormal joints or musculature NEUROLOGIC:  Cranial nerves 2-12 grossly intact. Moves all extremities  PSYCHIATRIC: Mood and affect appropriate to situation, no behavioral issues  Patient Active Problem List   Diagnosis Date Noted  . Metabolic acidosis, normal anion gap (NAG) 08/10/2015  . Gout 08/10/2015  . Type 2 diabetes mellitus with stage 3 chronic kidney disease ([place])    . Splenomegaly   . Microcytic anemia   . Essential hypertension   . Stage III chronic kidney disease   . Anemia in chronic kidney disease 07/29/2015  . Excess or deficiency of vitamin D 07/26/2012  . Multinodular goiter 07/25/2012  . Enlargement of spleen 07/25/2012  . Thrombocytopenia ([place]) 07/25/2012  . Airway hyperreactivity 03/26/2012  . Obstructive apnea 03/26/2012  . Arthritis of knee 12/18/2011  . Gonalgia 12/18/2011  . Chronic kidney disease (CKD), stage IV (severe) ([place]) 08/18/2011  . Type 2 diabetes mellitus ([place]) 08/18/2011  . Acid reflux 08/18/2011  . BP (high blood pressure) 08/18/2011  . 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 Anemia, chronic renal failure 08/04/2011  . Sickle cell disease, type [place] ([place]) 08/04/2011    CBC    Component Value Date/Time   WBC 6.7 08/11/2015 1503   WBC 7.3 08/06/2015 0540   WBC 6.0 03/08/2006 0921   RBC 3.98 08/11/2015 1503   RBC 4.04 08/11/2015 1502   RBC 3.68* 08/06/2015 0540   RBC 3.82 03/08/2006 0921   HGB 9.7* 08/11/2015 1503   HGB 8.9* 08/06/2015 0540   HGB 9.2* 03/08/2006 0921   HCT 28.6* 08/11/2015 1503   HCT 27.2* 08/06/2015 0540   HCT 27.4* 03/08/2006 0921   PLT 113* 08/11/2015 1503   PLT 65* 08/06/2015 0540   PLT 140* 03/08/2006 0921   MCV 72* 08/11/2015 1503   MCV 73.9* 08/06/2015 0540   MCV 71.7* 03/08/2006 0921   LYMPHSABS 1.9 08/11/2015 1503   LYMPHSABS 2.7 08/06/2015 0540   LYMPHSABS 1.5 03/08/2006 0921   MONOABS 0.4 08/06/2015 0540   MONOABS 0.5 03/08/2006 0921   EOSABS 0.1 08/11/2015 1503   EOSABS 0.1 08/06/2015 0540   EOSABS 0.1 03/08/2006 0921   BASOSABS 0.0 08/11/2015 1503   BASOSABS 0.1 08/06/2015 0540   BASOSABS 0.1 03/08/2006 0921    CMP     Component Value Date/Time   NA 142 08/11/2015 1503   NA 143 01/02/2013 1433   K 4.6 08/11/2015 1503   K 4.3 01/02/2013 1433   CL 115* 08/11/2015 1503   CL 112* 01/02/2013 1433   CO2 16* 08/11/2015 1503   CO2 24 01/02/2013 1433    GLUCOSE 145* 08/11/2015 1503   GLUCOSE 206* 01/02/2013 1433   BUN 41* 08/11/2015 1503   BUN 21 01/02/2013 1433   CREATININE 1.66* 08/11/2015 1503   CREATININE 1.5* 01/02/2013 1433   CALCIUM 8.4* 08/11/2015 1503   CALCIUM 8.8 01/02/2013 1433   PROT 6.7 08/11/2015 1503   PROT 7.1 08/23/2012 0855   ALBUMIN 4.0 08/11/2015 1503   ALBUMIN 3.9 08/23/2012 0855   AST 12 08/11/2015 1503   AST 16 08/23/2012 0855   ALT 9 08/11/2015 1503   ALT 17 08/23/2012 0855   ALKPHOS 86 08/11/2015 1503   ALKPHOS 104* 08/23/2012 0855   BILITOT 0.8 08/11/2015 1503   BILITOT 0.80 08/23/2012 0855   GFRNONAA 30* 08/06/2015 0540   GFRAA 34* 08/06/2015 0540    Lab Results  Component Value Date   HGBA1C 5.3 08/02/2015     US Abdomen Complete  07/30/2015  CLINICAL DATA:  Splenomegaly EXAM: ULTRASOUND ABDOMEN COMPLETE COMPARISON:  Abdominal ultrasound dated 06/22/2011. CT abdomen pelvis dated 02/04/2010. FINDINGS: Gallbladder: Surgically absent. Common bile duct: Diameter: 4 mm Liver: Hyperechoic hepatic parenchyma, suggesting hepatic steatosis. 2.0 x 1.9 x 2.0 cm complex/septated probable cyst in the lateral segment left hepatic lobe, previously 1.5 cm in 2011. Additional 1.3 x 1.0 x 0.9 cm simple cyst. IVC: No abnormality visualized. Pancreas: Visualized portion unremarkable. Spleen: Enlarged, measuring 15.5 x 16.5 x 6.5 cm (calculated volume 869 mL), decreased (previously calculated volume 1215 mL). Right Kidney: Length: 9.6 cm. Echogenic renal parenchyma with cortical thinning/ atrophy. No hydronephrosis. Left Kidney: Length: 9.5 cm. Echogenic renal parenchyma with cortical thinning/ atrophy. No hydronephrosis. Abdominal aorta: No aneurysm visualized. Other findings: None. IMPRESSION: Hepatic steatosis. Two probable hepatic cysts in the left hepatic lobe, one of which is complex/ septated and mildly increased from 2011, but still likely benign. Splenomegaly, measuring up to 16.5 cm (calculated volume 869 mL),  decreased from 2012. Status post cholecystectomy. Bilateral renal atrophy with  suspected medical renal disease. No hydronephrosis. Electronically Signed   By: Julian Hy M.D.   On: 07/30/2015 08:20    Not all labs, radiology exams or other studies done during hospitalization come through on my EPIC note; however they are reviewed by me.    Assessment and Plan  Microcytic anemia - Unclear etiology. - Hematology consultation and follow-up appreciated. Dr. Marin Olp reviewed her records from [place] and her hemoglobin apparently was pretty stable. He does not think that this is from sickle cell. - No symptoms to suggest bleeding as etiology. - Status post dose of Procrit and IV iron  - After 2 units of PRBCs, hemoglobin has improved to 8.8 g per DL. Difficult to find appropriate blood for patient due to multiple antibodies. - Hematology requested and interventional radiology performed a bone marrow biopsy on 11/17. Hematology suspecting that patient may have underlying bone marrow disease as etiology for anemia. Dr. Marin Olp has cleared patient for discharge home after bone marrow biopsy today. He will follow-up the results of the same and labs. - May consider outpatient GI consultation, follow-up and GI evaluation as deemed necessary  Stage III chronic kidney disease Last creatinine prior to admission: 1.56 on 02/13/13. Admitted with creatinine of 2.36. - Creatinine has improved and plateaued off in the 1.6 range which may be her baseline. Acute renal failure resolved.   Thrombocytopenia ([place]) Seems chronic impaired to her lab work in 2014. Relatively stable and no bleeding reported.    Type 2 diabetes mellitus with stage 3 chronic kidney disease ([place]) - At home patient takes 70/30 insulin-35 units a.m./38 units p.m. - In the hospital she is on Lantus 26 units twice a day along with NovoLog SSI - Hemoglobin A1c: 5.3 suggesting good outpatient control.SNF -  Continue prior home  dose of insulin's   Metabolic acidosis, normal anion gap (NAG) - Probably related to chronic kidney disease. May consider starting bicarbonate supplements orally as outpatient. Recommend nephrology consultation as outpatient.   Essential hypertension Fluctuating and mildly uncontrolled at times. DC HCTZ & ACEI secondary to advanced chronic kidney disease. SNF - Continue metoprolol. May have to add a second agent during outpatient follow-up if blood pressures remain persistently markedly elevated; consider amlodipine.   Gout SNF - cont allopurinol  Sickle cell disease, type [place] ([place]) Management per hematology. Not in acute crisis. Patient wished to follow-up at the [facility] for primary care needs.    Time spent 45 min;> 50% of time with patient was spent reviewing records, labs, tests and studies, counseling and developing plan of care  Hennie Duos, MD

## 2015-08-10 NOTE — Assessment & Plan Note (Signed)
Seems chronic impaired to her lab work in 2014. Relatively stable and no bleeding reported.

## 2015-08-11 ENCOUNTER — Encounter: Payer: Self-pay | Admitting: Family

## 2015-08-11 ENCOUNTER — Ambulatory Visit (HOSPITAL_BASED_OUTPATIENT_CLINIC_OR_DEPARTMENT_OTHER): Payer: Medicare Other

## 2015-08-11 ENCOUNTER — Ambulatory Visit (HOSPITAL_BASED_OUTPATIENT_CLINIC_OR_DEPARTMENT_OTHER): Payer: Medicare Other | Admitting: Family

## 2015-08-11 ENCOUNTER — Other Ambulatory Visit (HOSPITAL_BASED_OUTPATIENT_CLINIC_OR_DEPARTMENT_OTHER): Payer: Medicare Other

## 2015-08-11 VITALS — BP 152/73 | HR 84 | Temp 97.4°F | Resp 16 | Ht 65.0 in | Wt 197.0 lb

## 2015-08-11 DIAGNOSIS — D5720 Sickle-cell/Hb-C disease without crisis: Secondary | ICD-10-CM

## 2015-08-11 DIAGNOSIS — D631 Anemia in chronic kidney disease: Secondary | ICD-10-CM | POA: Diagnosis not present

## 2015-08-11 DIAGNOSIS — N189 Chronic kidney disease, unspecified: Secondary | ICD-10-CM

## 2015-08-11 DIAGNOSIS — N184 Chronic kidney disease, stage 4 (severe): Principal | ICD-10-CM

## 2015-08-11 LAB — CBC WITH DIFFERENTIAL (CANCER CENTER ONLY)
BASO#: 0 10*3/uL (ref 0.0–0.2)
BASO%: 0.6 % (ref 0.0–2.0)
EOS ABS: 0.1 10*3/uL (ref 0.0–0.5)
EOS%: 1.5 % (ref 0.0–7.0)
HEMATOCRIT: 28.6 % — AB (ref 34.8–46.6)
HGB: 9.7 g/dL — ABNORMAL LOW (ref 11.6–15.9)
LYMPH#: 1.9 10*3/uL (ref 0.9–3.3)
LYMPH%: 27.7 % (ref 14.0–48.0)
MCH: 24.4 pg — AB (ref 26.0–34.0)
MCHC: 33.9 g/dL (ref 32.0–36.0)
MCV: 72 fL — AB (ref 81–101)
MONO#: 0.5 10*3/uL (ref 0.1–0.9)
MONO%: 7.3 % (ref 0.0–13.0)
NEUT#: 4.2 10*3/uL (ref 1.5–6.5)
NEUT%: 62.9 % (ref 39.6–80.0)
Platelets: 113 10*3/uL — ABNORMAL LOW (ref 145–400)
RBC: 3.98 10*6/uL (ref 3.70–5.32)
RDW: 27.2 % — ABNORMAL HIGH (ref 11.1–15.7)
WBC: 6.7 10*3/uL (ref 3.9–10.0)

## 2015-08-11 LAB — FERRITIN: FERRITIN: 1310 ng/mL — AB (ref 10–291)

## 2015-08-11 LAB — COMPREHENSIVE METABOLIC PANEL
ALBUMIN: 4 g/dL (ref 3.6–5.1)
ALK PHOS: 86 U/L (ref 33–130)
ALT: 9 U/L (ref 6–29)
AST: 12 U/L (ref 10–35)
BILIRUBIN TOTAL: 0.8 mg/dL (ref 0.2–1.2)
BUN: 41 mg/dL — AB (ref 7–25)
CHLORIDE: 115 mmol/L — AB (ref 98–110)
CO2: 16 mmol/L — ABNORMAL LOW (ref 20–31)
CREATININE: 1.66 mg/dL — AB (ref 0.60–0.93)
Calcium: 8.4 mg/dL — ABNORMAL LOW (ref 8.6–10.4)
Glucose, Bld: 145 mg/dL — ABNORMAL HIGH (ref 65–99)
Potassium: 4.6 mmol/L (ref 3.5–5.3)
SODIUM: 142 mmol/L (ref 135–146)
TOTAL PROTEIN: 6.7 g/dL (ref 6.1–8.1)

## 2015-08-11 LAB — IRON AND TIBC
%SAT: 19 % (ref 11–50)
IRON: 49 ug/dL (ref 45–160)
TIBC: 252 ug/dL (ref 250–450)
UIBC: 203 ug/dL (ref 125–400)

## 2015-08-11 LAB — CHCC SATELLITE - SMEAR

## 2015-08-11 LAB — TECHNOLOGIST REVIEW CHCC SATELLITE

## 2015-08-11 MED ORDER — EPOETIN ALFA 40000 UNIT/ML IJ SOLN
40000.0000 [IU] | Freq: Once | INTRAMUSCULAR | Status: AC
  Administered 2015-08-11: 40000 [IU] via SUBCUTANEOUS

## 2015-08-11 MED ORDER — EPOETIN ALFA 40000 UNIT/ML IJ SOLN
INTRAMUSCULAR | Status: AC
  Filled 2015-08-11: qty 1

## 2015-08-11 NOTE — Patient Instructions (Signed)
Epoetin Alfa injection What is this medicine? EPOETIN ALFA (e POE e tin AL fa) helps your body make more red blood cells. This medicine is used to treat anemia caused by chronic kidney failure, cancer chemotherapy, or HIV-therapy. It may also be used before surgery if you have anemia. This medicine may be used for other purposes; ask your health care provider or pharmacist if you have questions. What should I tell my health care provider before I take this medicine? They need to know if you have any of these conditions: -blood clotting disorders -cancer patient not on chemotherapy -cystic fibrosis -heart disease, such as angina or heart failure -hemoglobin level of 12 g/dL or greater -high blood pressure -low levels of folate, iron, or vitamin B12 -seizures -an unusual or allergic reaction to erythropoietin, albumin, benzyl alcohol, hamster proteins, other medicines, foods, dyes, or preservatives -pregnant or trying to get pregnant -breast-feeding How should I use this medicine? This medicine is for injection into a vein or under the skin. It is usually given by a health care professional in a hospital or clinic setting. If you get this medicine at home, you will be taught how to prepare and give this medicine. Use exactly as directed. Take your medicine at regular intervals. Do not take your medicine more often than directed. It is important that you put your used needles and syringes in a special sharps container. Do not put them in a trash can. If you do not have a sharps container, call your pharmacist or healthcare provider to get one. Talk to your pediatrician regarding the use of this medicine in children. While this drug may be prescribed for selected conditions, precautions do apply. Overdosage: If you think you have taken too much of this medicine contact a poison control center or emergency room at once. NOTE: This medicine is only for you. Do not share this medicine with  others. What if I miss a dose? If you miss a dose, take it as soon as you can. If it is almost time for your next dose, take only that dose. Do not take double or extra doses. What may interact with this medicine? Do not take this medicine with any of the following medications: -darbepoetin alfa This list may not describe all possible interactions. Give your health care provider a list of all the medicines, herbs, non-prescription drugs, or dietary supplements you use. Also tell them if you smoke, drink alcohol, or use illegal drugs. Some items may interact with your medicine. What should I watch for while using this medicine? Visit your prescriber or health care professional for regular checks on your progress and for the needed blood tests and blood pressure measurements. It is especially important for the doctor to make sure your hemoglobin level is in the desired range, to limit the risk of potential side effects and to give you the best benefit. Keep all appointments for any recommended tests. Check your blood pressure as directed. Ask your doctor what your blood pressure should be and when you should contact him or her. As your body makes more red blood cells, you may need to take iron, folic acid, or vitamin B supplements. Ask your doctor or health care provider which products are right for you. If you have kidney disease continue dietary restrictions, even though this medication can make you feel better. Talk with your doctor or health care professional about the foods you eat and the vitamins that you take. What side effects may I notice   from receiving this medicine? Side effects that you should report to your doctor or health care professional as soon as possible: -allergic reactions like skin rash, itching or hives, swelling of the face, lips, or tongue -breathing problems -changes in vision -chest pain -confusion, trouble speaking or understanding -feeling faint or lightheaded,  falls -high blood pressure -muscle aches or pains -pain, swelling, warmth in the leg -rapid weight gain -severe headaches -sudden numbness or weakness of the face, arm or leg -trouble walking, dizziness, loss of balance or coordination -seizures (convulsions) -swelling of the ankles, feet, hands -unusually weak or tired Side effects that usually do not require medical attention (report to your doctor or health care professional if they continue or are bothersome): -diarrhea -fever, chills (flu-like symptoms) -headaches -nausea, vomiting -redness, stinging, or swelling at site where injected This list may not describe all possible side effects. Call your doctor for medical advice about side effects. You may report side effects to FDA at 1-800-FDA-1088. Where should I keep my medicine? Keep out of the reach of children. Store in a refrigerator between 2 and 8 degrees C (36 and 46 degrees F). Do not freeze or shake. Throw away any unused portion if using a single-dose vial. Multi-dose vials can be kept in the refrigerator for up to 21 days after the initial dose. Throw away unused medicine. NOTE: This sheet is a summary. It may not cover all possible information. If you have questions about this medicine, talk to your doctor, pharmacist, or health care provider.    2016, Elsevier/Gold Standard. (2008-08-18 10:25:44)  

## 2015-08-11 NOTE — Progress Notes (Signed)
Hematology and Oncology Follow Up Visit  Rachel Palmer [id number] [birth year] 74 y.o. 08/11/2015   Principle Diagnosis:  1. Sickle cell disease 2. 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 Chronic splenomegaly 4. Chronic thrombocytopenia   Current Therapy:   Procrit 40,000 units for Hgb < 10    Interim History:  Rachel Palmer is here today for a follow-up. She was hospitalized earlier this month with anemia with a Hgb of 5.7. She received blood, iron and procrit during admission. Her Hgb today is now up to 9.7 with an MCV of 72.  She did have a bone marrow biopsy while in the hospital and cytogenetics were normal.  She states that she is currently staying in a skilled nursing facility.  She has had no episodes of bleeding.  She is still having some fatigue and days where she feels "bad." She has SOB when walking a good distance. She is able to ambulate with a cane at home and denies having had any falls. She is in a wheelchair today because she states that it was too far for her to ambulate without getting tired.  No fever, chills, n/v, cough, rash, dizziness, SOB, chest pain, palpitations or changes in her bowel or bladder habits.  Splenomegaly on 07/30/15 abdominal US was slightly improved. This scan also showed hepatic steatosis. She has some mild tenderness on palpation of the right upper quadrant.  She states that she occasionally has a "mini" sickle cell pain crisis. She manages this from home with pain medication, rest and fluids. She has not been hospitalized for a sickle cell crisis in over 2 years. No swelling in her extremities.  She has a healthy appetite and is staying well hydrated. Her weight is stable.   She does not check her blood sugars regularly but her Hgb A1c earlier this month was 5.6.   Medications:    Medication List       This list is accurate as of: 08/11/15  3:21 PM.  Always use your most recent med list.               albuterol 108 (90 BASE) MCG/ACT  inhaler  Commonly known as:  PROVENTIL HFA;VENTOLIN HFA  Inhale 2 puffs into the lungs every 6 (six) hours as needed for wheezing or shortness of breath.     allopurinol 100 MG tablet  Commonly known as:  ZYLOPRIM  Take 100 mg by mouth every other day.     cetirizine 10 MG tablet  Commonly known as:  ZYRTEC  Take 10 mg by mouth daily.     ergocalciferol 50000 UNITS capsule  Commonly known as:  VITAMIN D2  Take 50,000 Units by mouth once a week. Tuesday     Fluticasone-Salmeterol 500-50 MCG/DOSE Aepb  Commonly known as:  ADVAIR  Inhale 1 puff into the lungs 2 (two) times daily.     folic acid 1 MG tablet  Commonly known as:  FOLVITE  Take 2 tablets (2 mg total) by mouth daily.     furosemide 20 MG tablet  Commonly known as:  LASIX  Take 20 mg by mouth as needed for fluid.     insulin aspart protamine- aspart (70-30) 100 UNIT/ML injection  Commonly known as:  NOVOLOG MIX 70/30  Inject 35-38 Units into the skin 2 (two) times daily with a meal. 35 in the morning and 38 in the evening     montelukast 10 MG tablet  Commonly known as:  SINGULAIR  Take 10 mg by mouth every morning.     multivitamin with minerals Tabs tablet  Take 1 tablet by mouth daily.     omeprazole 40 MG capsule  Commonly known as:  PRILOSEC  Take 40 mg by mouth daily.     oxycodone 5 MG capsule  Commonly known as:  OXY-IR  Take 1 capsule (5 mg total) by mouth every 4 (four) hours as needed for pain.     sertraline 100 MG tablet  Commonly known as:  ZOLOFT  Take 100 mg by mouth daily.     TOPROL XL PO  Take 100 mg by mouth daily.        Allergies:  Allergies  Allergen Reactions  . Codeine Hives  . Heparin [facility]  . Iohexol Hives    Synthetic xray dye=iohexol   . Ivp Dye [Iodinated Diagnostic Agents] Hives  . Keflex [Cephalexin] Hives  . Propoxyphene     Loopy  . Tape Rash    Skin peels    Past Medical History, Surgical history, Social history, and Family History were  reviewed and updated.  Review of Systems: All other 10 point review of systems is negative.   Physical Exam:  vitals were not taken for this visit.  Wt Readings from Last 3 Encounters:  07/29/15 198 lb 8 oz (90.039 kg)  07/29/15 197 lb 0.6 oz (89.377 kg)  01/30/13 187 lb (84.823 kg)    Ocular: Sclerae unicteric, pupils equal, round and reactive to light Ear-nose-throat: Oropharynx clear, dentition fair Lymphatic: No cervical supraclavicular or axillary adenopathy Lungs no rales or rhonchi, good excursion bilaterally Heart regular rate and rhythm, no murmur appreciated Abd soft, nontender, positive bowel sounds MSK no focal spinal tenderness, no joint edema Neuro: non-focal, well-oriented, appropriate affect Breasts: Deferred  Lab Results  Component Value Date   WBC 7.3 08/06/2015   HGB 8.9* 08/06/2015   HCT 27.2* 08/06/2015   MCV 73.9* 08/06/2015   PLT 65* 08/06/2015   Lab Results  Component Value Date   FERRITIN 822* 07/29/2015   IRON 62 07/29/2015   TIBC 305 07/29/2015   UIBC 243 07/29/2015   IRONPCTSAT 20 07/29/2015   Lab Results  Component Value Date   RETICCTPCT 7.8* 08/06/2015   RBC 3.68* 08/06/2015   RBC 3.68* 08/06/2015   RETICCTABS 146.4 07/29/2015   No results found for: KPAFRELGTCHN, LAMBDASER, KAPLAMBRATIO No results found for: IGGSERUM, IGA, IGMSERUM No results found for: Odetta Pink, SPEI   Chemistry      Component Value Date/Time   NA 140 08/06/2015 0540   NA 143 01/02/2013 1433   K 4.6 08/06/2015 0540   K 4.3 01/02/2013 1433   CL 115* 08/06/2015 0540   CL 112* 01/02/2013 1433   CO2 18* 08/06/2015 0540   CO2 24 01/02/2013 1433   BUN 39* 08/06/2015 0540   BUN 21 01/02/2013 1433   CREATININE 1.67* 08/06/2015 0540   CREATININE 1.5* 01/02/2013 1433      Component Value Date/Time   CALCIUM 8.4* 08/06/2015 0540   CALCIUM 8.8 01/02/2013 1433   ALKPHOS 82 08/06/2015 0540   ALKPHOS 104*  08/23/2012 0855   AST 18 08/06/2015 0540   AST 16 08/23/2012 0855   ALT 11* 08/06/2015 0540   ALT 17 08/23/2012 0855   BILITOT 1.1 08/06/2015 0540   BILITOT 0.80 08/23/2012 0855     Impression and Plan: Rachel Palmer is a very pleasant 74 yo African American female  with multiple hematologic issues. She was hopsitalized earlier this month with anemia and received blood, feraheme and procrit. She does have sickle cell and stage IV renal failure.  Her bone marrow biopsy cytogenetics were normal.  Her Hgb is now up to 9.7 with an MCV of 72.  She has been able to deal with "mini" pain crisis with her sickle cell at home with pain meds, rest and fluids and has not required hospitalization for this in over 2 years.  We will continue to follow along with her closely and plan to see her back in 2 weeks.  She will get a dose of Procrit today.  She has chronically high ferritin. Her iron saturation today is 19. We will hold off on giving her feraheme.  She will contact us with any questions or concerns. We can certainly see her sooner if need be.   Eliezer Bottom, NP 11/23/20163:21 PM

## 2015-08-16 LAB — HEMOGLOBINOPATHY EVALUATION
HEMOGLOBIN OTHER: 34.3 % — AB
HGB S QUANTITAION: 34.3 % — AB
Hgb A2 Quant: 3.1 % (ref 2.2–3.2)
Hgb A: 26.1 % — ABNORMAL LOW (ref 96.8–97.8)
Hgb F Quant: 2.2 % — ABNORMAL HIGH (ref 0.0–2.0)

## 2015-08-16 LAB — RETICULOCYTES (CHCC)
ABS RETIC: 161.6 10*3/uL (ref 19.0–186.0)
RBC.: 4.04 MIL/uL (ref 3.87–5.11)
Retic Ct Pct: 4 % — ABNORMAL HIGH (ref 0.4–2.3)

## 2015-08-17 ENCOUNTER — Encounter: Payer: Self-pay | Admitting: Internal Medicine

## 2015-08-17 ENCOUNTER — Non-Acute Institutional Stay (SKILLED_NURSING_FACILITY): Payer: Medicare Other | Admitting: Internal Medicine

## 2015-08-17 DIAGNOSIS — D696 Thrombocytopenia, unspecified: Secondary | ICD-10-CM

## 2015-08-17 DIAGNOSIS — E1122 Type 2 diabetes mellitus with diabetic chronic kidney disease: Secondary | ICD-10-CM | POA: Diagnosis not present

## 2015-08-17 DIAGNOSIS — M1A39X0 Chronic gout due to renal impairment, multiple sites, without tophus (tophi): Secondary | ICD-10-CM | POA: Diagnosis not present

## 2015-08-17 DIAGNOSIS — D509 Iron deficiency anemia, unspecified: Secondary | ICD-10-CM

## 2015-08-17 DIAGNOSIS — E872 Acidosis, unspecified: Secondary | ICD-10-CM

## 2015-08-17 DIAGNOSIS — E042 Nontoxic multinodular goiter: Secondary | ICD-10-CM

## 2015-08-17 DIAGNOSIS — Z794 Long term (current) use of insulin: Secondary | ICD-10-CM | POA: Diagnosis not present

## 2015-08-17 DIAGNOSIS — I10 Essential (primary) hypertension: Secondary | ICD-10-CM | POA: Diagnosis not present

## 2015-08-17 DIAGNOSIS — N184 Chronic kidney disease, stage 4 (severe): Secondary | ICD-10-CM

## 2015-08-17 DIAGNOSIS — N183 Chronic kidney disease, stage 3 (moderate): Secondary | ICD-10-CM | POA: Diagnosis not present

## 2015-08-17 NOTE — Progress Notes (Signed)
MRN: [id number] Name: Rachel Palmer  Sex: female Age: 74 y.o. DOB: 1941-06-17  [place] #: [facility] Facility/Room:110 Level Of Care: SNF Provider: Inocencio Homes D Emergency Contacts: Extended Emergency Contact Information Primary Emergency Contact: Navarez,John T Address: [address]          [place], [place] [address] Johnnette Litter of [place] Phone: [phone] Mobile Phone: [phone] Relation: Spouse  Code Status:   Allergies: Codeine; Heparin cross reactors; Iohexol; Ivp dye; Keflex; Propoxyphene; and Tape  Chief Complaint  Patient presents with  . Discharge Note    HPI: Patient is 74 y.o. female with hemoglobin Costilla disease, chronic anemia, stage III/IV chronic kidney disease, gout and 2 DM, HTN, recently returned to the [place] area from [place], had outpatient follow-up with Dr. Marin Olp, hematologist, complained of fatigue, dizziness and weakness off 3-4 days duration. In the office, hemoglobin found to be 5.2 and patient was referred to ED for admission. No symptoms to suggest bleeding from anywhere. Reported that she had a colonoscopy 3 years ago and benign polyps were removed.oPt was admitted to [facility] from 11/10-17 where her anemia was w/u and pt was transfused. Course was complicated by acute on chronic renal failure.Pt is admitted to SNF with generalized weakness.Pt is now ready to be d/c to home. Past Medical History  Diagnosis Date  . Hypertension   . Sickle cell disease ([place])   . Diabetes mellitus   . Anemia, chronic renal failure 08/04/2011  . Sickle cell disease, type [place] ([place]) 08/04/2011  . Blood transfusion without reported diagnosis   . Fatty liver     Past Surgical History  Procedure Laterality Date  . Cholecystectomy    . Shoulder surgery    . Appendectomy    . Knee surgery        Medication List       This list is accurate as of: 08/17/15  5:56 PM.  Always use your most recent med list.               albuterol 108 (90 BASE)  MCG/ACT inhaler  Commonly known as:  PROVENTIL HFA;VENTOLIN HFA  Inhale 2 puffs into the lungs every 6 (six) hours as needed for wheezing or shortness of breath.     allopurinol 100 MG tablet  Commonly known as:  ZYLOPRIM  Take 100 mg by mouth every other day.     cetirizine 10 MG tablet  Commonly known as:  ZYRTEC  Take 10 mg by mouth daily.     ergocalciferol 50000 UNITS capsule  Commonly known as:  VITAMIN D2  Take 50,000 Units by mouth once a week. Tuesday     Fluticasone-Salmeterol 500-50 MCG/DOSE Aepb  Commonly known as:  ADVAIR  Inhale 1 puff into the lungs 2 (two) times daily.     folic acid 1 MG tablet  Commonly known as:  FOLVITE  Take 2 tablets (2 mg total) by mouth daily.     furosemide 20 MG tablet  Commonly known as:  LASIX  Take 20 mg by mouth as needed for fluid.     insulin aspart protamine- aspart (70-30) 100 UNIT/ML injection  Commonly known as:  NOVOLOG MIX 70/30  Inject 35-38 Units into the skin 2 (two) times daily with a meal. 35 in the morning and 38 in the evening     montelukast 10 MG tablet  Commonly known as:  SINGULAIR  Take 10 mg by mouth every morning.     multivitamin with minerals  Tabs tablet  Take 1 tablet by mouth daily.     omeprazole 40 MG capsule  Commonly known as:  PRILOSEC  Take 40 mg by mouth daily.     oxycodone 5 MG capsule  Commonly known as:  OXY-IR  Take 1 capsule (5 mg total) by mouth every 4 (four) hours as needed for pain.     sertraline 100 MG tablet  Commonly known as:  ZOLOFT  Take 100 mg by mouth daily.     TOPROL XL PO  Take 100 mg by mouth daily.        No orders of the defined types were placed in this encounter.    Immunization History  Administered Date(s) Administered  . Influenza-Unspecified 06/28/2015    Social History  Substance Use Topics  . Smoking status: Former Research scientist (life sciences)  . Smokeless tobacco: Not on file  . Alcohol Use: No    Filed Vitals:   08/17/15 1750  BP: 123/89  Pulse: 94   Temp: 97.1 F (36.2 C)  Resp: 16    Physical Exam  GENERAL APPEARANCE: Alert, conversant. No acute distress.  HEENT: Unremarkable. RESPIRATORY: Breathing is even, unlabored. Lung sounds are clear   CARDIOVASCULAR: Heart RRR no murmurs, rubs or gallops. No peripheral edema.  GASTROINTESTINAL: Abdomen is soft, non-tender, not distended w/ normal bowel sounds.  NEUROLOGIC: Cranial nerves 2-12 grossly intact. Moves all extremities  Patient Active Problem List   Diagnosis Date Noted  . Metabolic acidosis, normal anion gap (NAG) 08/10/2015  . Gout 08/10/2015  . Type 2 diabetes mellitus with stage 3 chronic kidney disease ([place])   . Splenomegaly   . Microcytic anemia   . Essential hypertension   . Stage III chronic kidney disease   . Anemia in chronic kidney disease 07/29/2015  . Excess or deficiency of vitamin D 07/26/2012  . Multinodular goiter 07/25/2012  . Enlargement of spleen 07/25/2012  . Thrombocytopenia ([place]) 07/25/2012  . Airway hyperreactivity 03/26/2012  . Obstructive apnea 03/26/2012  . Arthritis of knee 12/18/2011  . Gonalgia 12/18/2011  . Chronic kidney disease (CKD), stage IV (severe) ([place]) 08/18/2011  . Type 2 diabetes mellitus ([place]) 08/18/2011  . Acid reflux 08/18/2011  . BP (high blood pressure) 08/18/2011  . 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 Anemia, chronic renal failure 08/04/2011  . Sickle cell disease, type [place] ([place]) 08/04/2011    CBC    Component Value Date/Time   WBC 6.7 08/11/2015 1503   WBC 7.3 08/06/2015 0540   WBC 6.0 03/08/2006 0921   RBC 3.98 08/11/2015 1503   RBC 4.04 08/11/2015 1502   RBC 3.68* 08/06/2015 0540   RBC 3.82 03/08/2006 0921   HGB 9.7* 08/11/2015 1503   HGB 8.9* 08/06/2015 0540   HGB 9.2* 03/08/2006 0921   HCT 28.6* 08/11/2015 1503   HCT 27.2* 08/06/2015 0540   HCT 27.4* 03/08/2006 0921   PLT 113* 08/11/2015 1503   PLT 65* 08/06/2015 0540   PLT 140* 03/08/2006 0921   MCV 72* 08/11/2015 1503    MCV 73.9* 08/06/2015 0540   MCV 71.7* 03/08/2006 0921   LYMPHSABS 1.9 08/11/2015 1503   LYMPHSABS 2.7 08/06/2015 0540   LYMPHSABS 1.5 03/08/2006 0921   MONOABS 0.4 08/06/2015 0540   MONOABS 0.5 03/08/2006 0921   EOSABS 0.1 08/11/2015 1503   EOSABS 0.1 08/06/2015 0540   EOSABS 0.1 03/08/2006 0921   BASOSABS 0.0 08/11/2015 1503   BASOSABS 0.1 08/06/2015 0540   BASOSABS 0.1 03/08/2006 [id number]  CMP     Component Value Date/Time   NA 142 08/11/2015 1503   NA 143 01/02/2013 1433   K 4.6 08/11/2015 1503   K 4.3 01/02/2013 1433   CL 115* 08/11/2015 1503   CL 112* 01/02/2013 1433   CO2 16* 08/11/2015 1503   CO2 24 01/02/2013 1433   GLUCOSE 145* 08/11/2015 1503   GLUCOSE 206* 01/02/2013 1433   BUN 41* 08/11/2015 1503   BUN 21 01/02/2013 1433   CREATININE 1.66* 08/11/2015 1503   CREATININE 1.5* 01/02/2013 1433   CALCIUM 8.4* 08/11/2015 1503   CALCIUM 8.8 01/02/2013 1433   PROT 6.7 08/11/2015 1503   PROT 7.1 08/23/2012 0855   ALBUMIN 4.0 08/11/2015 1503   ALBUMIN 3.9 08/23/2012 0855   AST 12 08/11/2015 1503   AST 16 08/23/2012 0855   ALT 9 08/11/2015 1503   ALT 17 08/23/2012 0855   ALKPHOS 86 08/11/2015 1503   ALKPHOS 104* 08/23/2012 0855   BILITOT 0.8 08/11/2015 1503   BILITOT 0.80 08/23/2012 0855   GFRNONAA 30* 08/06/2015 0540   GFRAA 34* 08/06/2015 0540    Assessment and Plan  Pt is d/c to home with HH/OT/PT/Nursing. DME's have been ordered. Rx's have been written.   Time spent 30 min Hennie Duos, MD

## 2015-08-18 LAB — CHROMOSOME ANALYSIS, BONE MARROW

## 2015-08-18 LAB — TISSUE HYBRIDIZATION (BONE MARROW)-NCBH

## 2015-08-24 ENCOUNTER — Ambulatory Visit: Payer: Medicare Other | Admitting: Internal Medicine

## 2015-08-24 ENCOUNTER — Encounter (HOSPITAL_COMMUNITY): Payer: Self-pay

## 2015-08-26 ENCOUNTER — Ambulatory Visit: Payer: Medicare Other | Admitting: Hematology & Oncology

## 2015-08-26 ENCOUNTER — Ambulatory Visit: Payer: Medicare Other

## 2015-08-26 ENCOUNTER — Other Ambulatory Visit: Payer: Medicare Other

## 2015-08-27 DIAGNOSIS — M159 Polyosteoarthritis, unspecified: Secondary | ICD-10-CM | POA: Insufficient documentation

## 2015-08-27 DIAGNOSIS — J441 Chronic obstructive pulmonary disease with (acute) exacerbation: Secondary | ICD-10-CM | POA: Insufficient documentation

## 2015-08-27 DIAGNOSIS — M1039 Gout due to renal impairment, multiple sites: Secondary | ICD-10-CM | POA: Insufficient documentation

## 2015-08-31 ENCOUNTER — Encounter: Payer: Self-pay | Admitting: Hematology & Oncology

## 2015-09-01 ENCOUNTER — Other Ambulatory Visit (HOSPITAL_BASED_OUTPATIENT_CLINIC_OR_DEPARTMENT_OTHER): Payer: Medicare Other

## 2015-09-01 ENCOUNTER — Ambulatory Visit (HOSPITAL_BASED_OUTPATIENT_CLINIC_OR_DEPARTMENT_OTHER): Payer: Medicare Other

## 2015-09-01 ENCOUNTER — Encounter: Payer: Self-pay | Admitting: Hematology & Oncology

## 2015-09-01 ENCOUNTER — Encounter (HOSPITAL_BASED_OUTPATIENT_CLINIC_OR_DEPARTMENT_OTHER): Payer: Medicare Other | Admitting: Hematology & Oncology

## 2015-09-01 VITALS — BP 147/77 | HR 103 | Temp 98.0°F | Resp 18 | Wt 197.0 lb

## 2015-09-01 DIAGNOSIS — D571 Sickle-cell disease without crisis: Secondary | ICD-10-CM

## 2015-09-01 DIAGNOSIS — N183 Chronic kidney disease, stage 3 (moderate): Principal | ICD-10-CM

## 2015-09-01 DIAGNOSIS — D5720 Sickle-cell/Hb-C disease without crisis: Secondary | ICD-10-CM

## 2015-09-01 DIAGNOSIS — D732 Chronic congestive splenomegaly: Secondary | ICD-10-CM | POA: Diagnosis not present

## 2015-09-01 DIAGNOSIS — N184 Chronic kidney disease, stage 4 (severe): Secondary | ICD-10-CM

## 2015-09-01 DIAGNOSIS — D696 Thrombocytopenia, unspecified: Secondary | ICD-10-CM | POA: Diagnosis not present

## 2015-09-01 DIAGNOSIS — D631 Anemia in chronic kidney disease: Secondary | ICD-10-CM

## 2015-09-01 DIAGNOSIS — D57219 Sickle-cell/Hb-C disease with crisis, unspecified: Secondary | ICD-10-CM

## 2015-09-01 LAB — COMPREHENSIVE METABOLIC PANEL
ALT: 10 U/L (ref 0–55)
AST: 13 U/L (ref 5–34)
Albumin: 4 g/dL (ref 3.5–5.0)
Alkaline Phosphatase: 93 U/L (ref 40–150)
Anion Gap: 12 mEq/L — ABNORMAL HIGH (ref 3–11)
BILIRUBIN TOTAL: 0.9 mg/dL (ref 0.20–1.20)
BUN: 26.7 mg/dL — AB (ref 7.0–26.0)
CO2: 16 meq/L — AB (ref 22–29)
CREATININE: 1.9 mg/dL — AB (ref 0.6–1.1)
Calcium: 8 mg/dL — ABNORMAL LOW (ref 8.4–10.4)
Chloride: 116 mEq/L — ABNORMAL HIGH (ref 98–109)
EGFR: 30 mL/min/{1.73_m2} — ABNORMAL LOW (ref >90)
GLUCOSE: 202 mg/dL — AB (ref 70–140)
Potassium: 3.8 mEq/L (ref 3.5–5.1)
SODIUM: 144 meq/L (ref 136–145)
TOTAL PROTEIN: 7.3 g/dL (ref 6.4–8.3)

## 2015-09-01 LAB — CBC WITH DIFFERENTIAL (CANCER CENTER ONLY)
BASO#: 0 10*3/uL (ref 0.0–0.2)
BASO%: 0.7 % (ref 0.0–2.0)
EOS ABS: 0.1 10*3/uL (ref 0.0–0.5)
EOS%: 1.6 % (ref 0.0–7.0)
HEMATOCRIT: 26.5 % — AB (ref 34.8–46.6)
HEMOGLOBIN: 8.9 g/dL — AB (ref 11.6–15.9)
LYMPH#: 1.4 10*3/uL (ref 0.9–3.3)
LYMPH%: 24.1 % (ref 14.0–48.0)
MCH: 23.6 pg — AB (ref 26.0–34.0)
MCHC: 33.6 g/dL (ref 32.0–36.0)
MCV: 70 fL — ABNORMAL LOW (ref 81–101)
MONO#: 0.3 10*3/uL (ref 0.1–0.9)
MONO%: 5.9 % (ref 0.0–13.0)
NEUT%: 67.7 % (ref 39.6–80.0)
NEUTROS ABS: 3.9 10*3/uL (ref 1.5–6.5)
Platelets: 89 10*3/uL — ABNORMAL LOW (ref 145–400)
RBC: 3.77 10*6/uL (ref 3.70–5.32)
RDW: 24.5 % — AB (ref 11.1–15.7)
WBC: 5.8 10*3/uL (ref 3.9–10.0)

## 2015-09-01 LAB — RETICULOCYTES
ABS RETIC: 135.1 10*3/uL (ref 19.0–186.0)
RBC.: 3.86 MIL/uL — ABNORMAL LOW (ref 3.87–5.11)
Retic Ct Pct: 3.5 % — ABNORMAL HIGH (ref 0.4–2.3)

## 2015-09-01 LAB — IRON AND TIBC
%SAT: 32 % (ref 21–57)
Iron: 69 ug/dL (ref 41–142)
TIBC: 214 ug/dL — ABNORMAL LOW (ref 236–444)
UIBC: 145 ug/dL (ref 120–384)

## 2015-09-01 LAB — FERRITIN: Ferritin: 1729 ng/ml — ABNORMAL HIGH (ref 9–269)

## 2015-09-01 LAB — HOLD TUBE, BLOOD BANK - CHCC SATELLITE

## 2015-09-01 LAB — CHCC SATELLITE - SMEAR

## 2015-09-01 MED ORDER — EPOETIN ALFA 40000 UNIT/ML IJ SOLN
INTRAMUSCULAR | Status: AC
  Filled 2015-09-01: qty 1

## 2015-09-01 MED ORDER — EPOETIN ALFA 40000 UNIT/ML IJ SOLN
40000.0000 [IU] | Freq: Once | INTRAMUSCULAR | Status: AC
  Administered 2015-09-01: 40000 [IU] via SUBCUTANEOUS

## 2015-09-01 NOTE — Patient Instructions (Signed)
Epoetin Alfa injection What is this medicine? EPOETIN ALFA (e POE e tin AL fa) helps your body make more red blood cells. This medicine is used to treat anemia caused by chronic kidney failure, cancer chemotherapy, or HIV-therapy. It may also be used before surgery if you have anemia. This medicine may be used for other purposes; ask your health care provider or pharmacist if you have questions. What should I tell my health care provider before I take this medicine? They need to know if you have any of these conditions: -blood clotting disorders -cancer patient not on chemotherapy -cystic fibrosis -heart disease, such as angina or heart failure -hemoglobin level of 12 g/dL or greater -high blood pressure -low levels of folate, iron, or vitamin B12 -seizures -an unusual or allergic reaction to erythropoietin, albumin, benzyl alcohol, hamster proteins, other medicines, foods, dyes, or preservatives -pregnant or trying to get pregnant -breast-feeding How should I use this medicine? This medicine is for injection into a vein or under the skin. It is usually given by a health care professional in a hospital or clinic setting. If you get this medicine at home, you will be taught how to prepare and give this medicine. Use exactly as directed. Take your medicine at regular intervals. Do not take your medicine more often than directed. It is important that you put your used needles and syringes in a special sharps container. Do not put them in a trash can. If you do not have a sharps container, call your pharmacist or healthcare provider to get one. Talk to your pediatrician regarding the use of this medicine in children. While this drug may be prescribed for selected conditions, precautions do apply. Overdosage: If you think you have taken too much of this medicine contact a poison control center or emergency room at once. NOTE: This medicine is only for you. Do not share this medicine with  others. What if I miss a dose? If you miss a dose, take it as soon as you can. If it is almost time for your next dose, take only that dose. Do not take double or extra doses. What may interact with this medicine? Do not take this medicine with any of the following medications: -darbepoetin alfa This list may not describe all possible interactions. Give your health care provider a list of all the medicines, herbs, non-prescription drugs, or dietary supplements you use. Also tell them if you smoke, drink alcohol, or use illegal drugs. Some items may interact with your medicine. What should I watch for while using this medicine? Visit your prescriber or health care professional for regular checks on your progress and for the needed blood tests and blood pressure measurements. It is especially important for the doctor to make sure your hemoglobin level is in the desired range, to limit the risk of potential side effects and to give you the best benefit. Keep all appointments for any recommended tests. Check your blood pressure as directed. Ask your doctor what your blood pressure should be and when you should contact him or her. As your body makes more red blood cells, you may need to take iron, folic acid, or vitamin B supplements. Ask your doctor or health care provider which products are right for you. If you have kidney disease continue dietary restrictions, even though this medication can make you feel better. Talk with your doctor or health care professional about the foods you eat and the vitamins that you take. What side effects may I notice   from receiving this medicine? Side effects that you should report to your doctor or health care professional as soon as possible: -allergic reactions like skin rash, itching or hives, swelling of the face, lips, or tongue -breathing problems -changes in vision -chest pain -confusion, trouble speaking or understanding -feeling faint or lightheaded,  falls -high blood pressure -muscle aches or pains -pain, swelling, warmth in the leg -rapid weight gain -severe headaches -sudden numbness or weakness of the face, arm or leg -trouble walking, dizziness, loss of balance or coordination -seizures (convulsions) -swelling of the ankles, feet, hands -unusually weak or tired Side effects that usually do not require medical attention (report to your doctor or health care professional if they continue or are bothersome): -diarrhea -fever, chills (flu-like symptoms) -headaches -nausea, vomiting -redness, stinging, or swelling at site where injected This list may not describe all possible side effects. Call your doctor for medical advice about side effects. You may report side effects to FDA at 1-800-FDA-1088. Where should I keep my medicine? Keep out of the reach of children. Store in a refrigerator between 2 and 8 degrees C (36 and 46 degrees F). Do not freeze or shake. Throw away any unused portion if using a single-dose vial. Multi-dose vials can be kept in the refrigerator for up to 21 days after the initial dose. Throw away unused medicine. NOTE: This sheet is a summary. It may not cover all possible information. If you have questions about this medicine, talk to your doctor, pharmacist, or health care provider.    2016, Elsevier/Gold Standard. (2008-08-18 10:25:44)  

## 2015-09-07 ENCOUNTER — Ambulatory Visit: Payer: Medicare Other | Admitting: Internal Medicine

## 2015-09-16 ENCOUNTER — Ambulatory Visit (HOSPITAL_BASED_OUTPATIENT_CLINIC_OR_DEPARTMENT_OTHER): Payer: Medicare Other | Admitting: Family

## 2015-09-16 ENCOUNTER — Ambulatory Visit (HOSPITAL_BASED_OUTPATIENT_CLINIC_OR_DEPARTMENT_OTHER): Payer: Medicare Other

## 2015-09-16 ENCOUNTER — Encounter: Payer: Self-pay | Admitting: Family

## 2015-09-16 ENCOUNTER — Other Ambulatory Visit (HOSPITAL_BASED_OUTPATIENT_CLINIC_OR_DEPARTMENT_OTHER): Payer: Medicare Other

## 2015-09-16 VITALS — BP 152/71 | HR 80 | Temp 97.7°F | Resp 16 | Ht 65.0 in | Wt 198.0 lb

## 2015-09-16 DIAGNOSIS — N183 Chronic kidney disease, stage 3 unspecified: Secondary | ICD-10-CM

## 2015-09-16 DIAGNOSIS — D571 Sickle-cell disease without crisis: Secondary | ICD-10-CM

## 2015-09-16 DIAGNOSIS — D631 Anemia in chronic kidney disease: Secondary | ICD-10-CM | POA: Diagnosis not present

## 2015-09-16 DIAGNOSIS — N181 Chronic kidney disease, stage 1: Secondary | ICD-10-CM | POA: Diagnosis present

## 2015-09-16 DIAGNOSIS — D57219 Sickle-cell/Hb-C disease with crisis, unspecified: Secondary | ICD-10-CM | POA: Diagnosis not present

## 2015-09-16 LAB — CBC WITH DIFFERENTIAL (CANCER CENTER ONLY)
BASO#: 0 10*3/uL (ref 0.0–0.2)
BASO%: 0.5 % (ref 0.0–2.0)
EOS ABS: 0.2 10*3/uL (ref 0.0–0.5)
EOS%: 2.6 % (ref 0.0–7.0)
HEMATOCRIT: 29.1 % — AB (ref 34.8–46.6)
HGB: 9.7 g/dL — ABNORMAL LOW (ref 11.6–15.9)
LYMPH#: 1.9 10*3/uL (ref 0.9–3.3)
LYMPH%: 32.5 % (ref 14.0–48.0)
MCH: 23.3 pg — ABNORMAL LOW (ref 26.0–34.0)
MCHC: 33.3 g/dL (ref 32.0–36.0)
MCV: 70 fL — AB (ref 81–101)
MONO#: 0.4 10*3/uL (ref 0.1–0.9)
MONO%: 7.2 % (ref 0.0–13.0)
NEUT#: 3.4 10*3/uL (ref 1.5–6.5)
NEUT%: 57.2 % (ref 39.6–80.0)
Platelets: 93 10*3/uL — ABNORMAL LOW (ref 145–400)
RBC: 4.17 10*6/uL (ref 3.70–5.32)
RDW: 23.2 % — ABNORMAL HIGH (ref 11.1–15.7)
WBC: 5.9 10*3/uL (ref 3.9–10.0)

## 2015-09-16 LAB — COMPREHENSIVE METABOLIC PANEL
ALBUMIN: 4.2 g/dL (ref 3.5–5.0)
ALK PHOS: 99 U/L (ref 40–150)
ALT: 10 U/L (ref 0–55)
AST: 14 U/L (ref 5–34)
Anion Gap: 8 mEq/L (ref 3–11)
BUN: 26.3 mg/dL — AB (ref 7.0–26.0)
CALCIUM: 8.7 mg/dL (ref 8.4–10.4)
CO2: 21 mEq/L — ABNORMAL LOW (ref 22–29)
CREATININE: 1.9 mg/dL — AB (ref 0.6–1.1)
Chloride: 115 mEq/L — ABNORMAL HIGH (ref 98–109)
EGFR: 31 mL/min/{1.73_m2} — ABNORMAL LOW (ref >90)
Glucose: 123 mg/dl (ref 70–140)
Potassium: 5.2 mEq/L — ABNORMAL HIGH (ref 3.5–5.1)
Sodium: 143 mEq/L (ref 136–145)
Total Bilirubin: 0.76 mg/dL (ref 0.20–1.20)
Total Protein: 7.3 g/dL (ref 6.4–8.3)

## 2015-09-16 LAB — CHCC SATELLITE - SMEAR

## 2015-09-16 LAB — IRON AND TIBC
%SAT: 29 % (ref 21–57)
IRON: 62 ug/dL (ref 41–142)
TIBC: 211 ug/dL — AB (ref 236–444)
UIBC: 150 ug/dL (ref 120–384)

## 2015-09-16 LAB — RETICULOCYTES
ABS RETIC: 135 10*3/uL (ref 19.0–186.0)
RBC.: 4.22 MIL/uL (ref 3.87–5.11)
Retic Ct Pct: 3.2 % — ABNORMAL HIGH (ref 0.4–2.3)

## 2015-09-16 LAB — TECHNOLOGIST REVIEW CHCC SATELLITE

## 2015-09-16 LAB — FERRITIN: Ferritin: 1923 ng/ml — ABNORMAL HIGH (ref 9–269)

## 2015-09-16 MED ORDER — EPOETIN ALFA 40000 UNIT/ML IJ SOLN: 40000.0000 [IU] | Freq: Once | INTRAMUSCULAR | Status: DC

## 2015-09-16 MED ORDER — EPOETIN ALFA 40000 UNIT/ML IJ SOLN
40000.0000 [IU] | Freq: Once | INTRAMUSCULAR | Status: AC
  Administered 2015-09-16: 40000 [IU] via SUBCUTANEOUS

## 2015-09-16 MED ORDER — EPOETIN ALFA 40000 UNIT/ML IJ SOLN
INTRAMUSCULAR | Status: AC
Start: 2015-09-16 — End: 2015-09-16
  Filled 2015-09-16: qty 1

## 2015-09-16 NOTE — Patient Instructions (Signed)
Darbepoetin Alfa injection What is this medicine? DARBEPOETIN ALFA (dar be POE e tin AL fa) helps your body make more red blood cells. It is used to treat anemia caused by chronic kidney failure and chemotherapy. This medicine may be used for other purposes; ask your health care provider or pharmacist if you have questions. What should I tell my health care provider before I take this medicine? They need to know if you have any of these conditions: -blood clotting disorders or history of blood clots -cancer patient not on chemotherapy -cystic fibrosis -heart disease, such as angina, heart failure, or a history of a heart attack -hemoglobin level of 12 g/dL or greater -high blood pressure -low levels of folate, iron, or vitamin B12 -seizures -an unusual or allergic reaction to darbepoetin, erythropoietin, albumin, hamster proteins, latex, other medicines, foods, dyes, or preservatives -pregnant or trying to get pregnant -breast-feeding How should I use this medicine? This medicine is for injection into a vein or under the skin. It is usually given by a health care professional in a hospital or clinic setting. If you get this medicine at home, you will be taught how to prepare and give this medicine. Do not shake the solution before you withdraw a dose. Use exactly as directed. Take your medicine at regular intervals. Do not take your medicine more often than directed. It is important that you put your used needles and syringes in a special sharps container. Do not put them in a trash can. If you do not have a sharps container, call your pharmacist or healthcare provider to get one. Talk to your pediatrician regarding the use of this medicine in children. While this medicine may be used in children as young as 1 year for selected conditions, precautions do apply. Overdosage: If you think you have taken too much of this medicine contact a poison control center or emergency room at once. NOTE:  This medicine is only for you. Do not share this medicine with others. What if I miss a dose? If you miss a dose, take it as soon as you can. If it is almost time for your next dose, take only that dose. Do not take double or extra doses. What may interact with this medicine? Do not take this medicine with any of the following medications: -epoetin alfa This list may not describe all possible interactions. Give your health care provider a list of all the medicines, herbs, non-prescription drugs, or dietary supplements you use. Also tell them if you smoke, drink alcohol, or use illegal drugs. Some items may interact with your medicine. What should I watch for while using this medicine? Visit your prescriber or health care professional for regular checks on your progress and for the needed blood tests and blood pressure measurements. It is especially important for the doctor to make sure your hemoglobin level is in the desired range, to limit the risk of potential side effects and to give you the best benefit. Keep all appointments for any recommended tests. Check your blood pressure as directed. Ask your doctor what your blood pressure should be and when you should contact him or her. As your body makes more red blood cells, you may need to take iron, folic acid, or vitamin B supplements. Ask your doctor or health care provider which products are right for you. If you have kidney disease continue dietary restrictions, even though this medication can make you feel better. Talk with your doctor or health care professional about the   foods you eat and the vitamins that you take. What side effects may I notice from receiving this medicine? Side effects that you should report to your doctor or health care professional as soon as possible: -allergic reactions like skin rash, itching or hives, swelling of the face, lips, or tongue -breathing problems -changes in vision -chest pain -confusion, trouble speaking  or understanding -feeling faint or lightheaded, falls -high blood pressure -muscle aches or pains -pain, swelling, warmth in the leg -rapid weight gain -severe headaches -sudden numbness or weakness of the face, arm or leg -trouble walking, dizziness, loss of balance or coordination -seizures (convulsions) -swelling of the ankles, feet, hands -unusually weak or tired Side effects that usually do not require medical attention (report to your doctor or health care professional if they continue or are bothersome): -diarrhea -fever, chills (flu-like symptoms) -headaches -nausea, vomiting -redness, stinging, or swelling at site where injected This list may not describe all possible side effects. Call your doctor for medical advice about side effects. You may report side effects to FDA at 1-800-FDA-1088. Where should I keep my medicine? Keep out of the reach of children. Store in a refrigerator between 2 and 8 degrees C (36 and 46 degrees F). Do not freeze. Do not shake. Throw away any unused portion if using a single-dose vial. Throw away any unused medicine after the expiration date. NOTE: This sheet is a summary. It may not cover all possible information. If you have questions about this medicine, talk to your doctor, pharmacist, or health care provider.    2016, Elsevier/Gold Standard. (2008-08-18 10:23:57)  

## 2015-09-16 NOTE — Progress Notes (Signed)
Hematology and Oncology Follow Up Visit  Rachel Palmer [id number] [birth year] 74 y.o. 09/16/2015   Principle Diagnosis:  1. Sickle cell disease 2. 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 Chronic splenomegaly 4. Chronic thrombocytopenia   Current Therapy:   Procrit 40,000 units for Hgb < 10    Interim History:  Rachel Palmer is here today for a follow-up. She is feeling better and her energy continues to improve.  She has not had a  crisis in quite a while. She has no c/o tenderness, numbness or tingling in her extremities.  No fever, chills, n/v, cough, rash, dizziness, chest pain, palpitations or changes in her bowel or bladder habits. Some mild SOB with exertion that resolves with rest.  She is still having some right lower quadrant pain that comes and goes. This area is slightly tender on palpation today. No organomegaly found on exam.  No syncopal episodes of falls.  She is eating healthy and staying well hydrated. Her weight is unchanged.   Medications:    Medication List       This list is accurate as of: 09/16/15 12:41 PM.  Always use your most recent med list.               albuterol 108 (90 Base) MCG/ACT inhaler  Commonly known as:  PROVENTIL HFA;VENTOLIN HFA  Inhale 2 puffs into the lungs every 6 (six) hours as needed for wheezing or shortness of breath.     allopurinol 100 MG tablet  Commonly known as:  ZYLOPRIM  Take 100 mg by mouth every other day.     cetirizine 10 MG tablet  Commonly known as:  ZYRTEC  Take 10 mg by mouth daily.     ergocalciferol 50000 units capsule  Commonly known as:  VITAMIN D2  Take 50,000 Units by mouth once a week. Tuesday     Fluticasone-Salmeterol 500-50 MCG/DOSE Aepb  Commonly known as:  ADVAIR  Inhale 1 puff into the lungs 2 (two) times daily.     folic acid 1 MG tablet  Commonly known as:  FOLVITE  Take 2 tablets (2 mg total) by mouth daily.     furosemide 20 MG tablet  Commonly known as:  LASIX  Take 20 mg by  mouth as needed for fluid.     hydrochlorothiazide 25 MG tablet  Commonly known as:  HYDRODIURIL     insulin aspart protamine- aspart (70-30) 100 UNIT/ML injection  Commonly known as:  NOVOLOG MIX 70/30  Inject 35-38 Units into the skin 2 (two) times daily with a meal. 35 in the morning and 38 in the evening     montelukast 10 MG tablet  Commonly known as:  SINGULAIR  Take 10 mg by mouth every morning.     multivitamin with minerals Tabs tablet  Take 1 tablet by mouth daily.     omeprazole 40 MG capsule  Commonly known as:  PRILOSEC  Take 40 mg by mouth daily.     oxycodone 5 MG capsule  Commonly known as:  OXY-IR  Take 1 capsule (5 mg total) by mouth every 4 (four) hours as needed for pain.     sertraline 100 MG tablet  Commonly known as:  ZOLOFT  Take 100 mg by mouth daily.     TOPROL XL PO  Take 100 mg by mouth daily.        Allergies:  Allergies  Allergen Reactions  . Codeine Hives  . Heparin [facility]  .  Iohexol Hives    Synthetic xray dye=iohexol   . Ivp Dye [Iodinated Diagnostic Agents] Hives  . Keflex [Cephalexin] Hives  . Propoxyphene     Loopy  . Tape Rash    Skin peels    Past Medical History, Surgical history, Social history, and Family History were reviewed and updated.  Review of Systems: All other 10 point review of systems is negative.   Physical Exam:  height is 5\' 5"  (1.651 m) and weight is 198 lb (89.812 kg). Her oral temperature is 97.7 F (36.5 C). Her blood pressure is 152/71 and her pulse is 80. Her respiration is 16.   Wt Readings from Last 3 Encounters:  09/16/15 198 lb (89.812 kg)  09/01/15 197 lb (89.359 kg)  08/11/15 197 lb (89.359 kg)    Ocular: Sclerae unicteric, pupils equal, round and reactive to light Ear-nose-throat: Oropharynx clear, dentition fair Lymphatic: No cervical supraclavicular or axillary adenopathy Lungs no rales or rhonchi, good excursion bilaterally Heart regular rate and rhythm, no murmur  appreciated Abd soft, right lower quadrant slightly tender, positive bowel sounds, no liver or spleen tip found on exam MSK no focal spinal tenderness, no joint edema Neuro: non-focal, well-oriented, appropriate affect Breasts: Deferred  Lab Results  Component Value Date   WBC 5.9 09/16/2015   HGB 9.7* 09/16/2015   HCT 29.1* 09/16/2015   MCV 70* 09/16/2015   PLT 93* 09/16/2015   Lab Results  Component Value Date   FERRITIN 1,729* 09/01/2015   IRON 69 09/01/2015   TIBC 214* 09/01/2015   UIBC 145 09/01/2015   IRONPCTSAT 32 09/01/2015   Lab Results  Component Value Date   RETICCTPCT 3.5* 09/01/2015   RBC 4.17 09/16/2015   RETICCTABS 135.1 09/01/2015   No results found for: KPAFRELGTCHN, LAMBDASER, KAPLAMBRATIO No results found for: IGGSERUM, IGA, IGMSERUM No results found for: Odetta Pink, SPEI   Chemistry      Component Value Date/Time   NA 144 09/01/2015 1003   NA 142 08/11/2015 1503   NA 143 01/02/2013 1433   K 3.8 09/01/2015 1003   K 4.6 08/11/2015 1503   K 4.3 01/02/2013 1433   CL 115* 08/11/2015 1503   CL 112* 01/02/2013 1433   CO2 16* 09/01/2015 1003   CO2 16* 08/11/2015 1503   CO2 24 01/02/2013 1433   BUN 26.7* 09/01/2015 1003   BUN 41* 08/11/2015 1503   BUN 21 01/02/2013 1433   CREATININE 1.9* 09/01/2015 1003   CREATININE 1.66* 08/11/2015 1503   CREATININE 1.5* 01/02/2013 1433      Component Value Date/Time   CALCIUM 8.0* 09/01/2015 1003   CALCIUM 8.4* 08/11/2015 1503   CALCIUM 8.8 01/02/2013 1433   ALKPHOS 93 09/01/2015 1003   ALKPHOS 86 08/11/2015 1503   ALKPHOS 104* 08/23/2012 0855   AST 13 09/01/2015 1003   AST 12 08/11/2015 1503   AST 16 08/23/2012 0855   ALT 10 09/01/2015 1003   ALT 9 08/11/2015 1503   ALT 17 08/23/2012 0855   BILITOT 0.90 09/01/2015 1003   BILITOT 0.8 08/11/2015 1503   BILITOT 0.80 08/23/2012 0855     Impression and Plan: Rachel Palmer is a very pleasant 74 yo African  American female with multiple hematologic issues. She is doing well and feeling much better since her last visit.   Her Hgb is 9.7 with an MCV of 70. Her ferritin remains elevated at 1,923.  No sickle cell crisis. Her pain is managed effectively  with Oxycodone and she stays well hydrated.  She will receive Procrit today.  We will plan to see her back in 1 month for labs and follow-up.   She will contact us with any questions or concerns. We can certainly see her sooner if need be.   Eliezer Bottom, NP 12/29/201612:41 PM

## 2015-10-19 DIAGNOSIS — IMO0002 Reserved for concepts with insufficient information to code with codable children: Secondary | ICD-10-CM | POA: Insufficient documentation

## 2015-10-19 DIAGNOSIS — J449 Chronic obstructive pulmonary disease, unspecified: Secondary | ICD-10-CM | POA: Insufficient documentation

## 2015-10-20 ENCOUNTER — Ambulatory Visit: Payer: Medicare Other

## 2015-10-20 ENCOUNTER — Other Ambulatory Visit: Payer: Medicare Other

## 2015-10-20 ENCOUNTER — Ambulatory Visit: Payer: Medicare Other | Admitting: Family

## 2015-10-21 ENCOUNTER — Other Ambulatory Visit (HOSPITAL_BASED_OUTPATIENT_CLINIC_OR_DEPARTMENT_OTHER): Payer: Medicare Other

## 2015-10-21 ENCOUNTER — Ambulatory Visit (HOSPITAL_BASED_OUTPATIENT_CLINIC_OR_DEPARTMENT_OTHER): Payer: Medicare Other | Admitting: Hematology & Oncology

## 2015-10-21 ENCOUNTER — Encounter: Payer: Self-pay | Admitting: Hematology & Oncology

## 2015-10-21 ENCOUNTER — Ambulatory Visit (HOSPITAL_BASED_OUTPATIENT_CLINIC_OR_DEPARTMENT_OTHER): Payer: Medicare Other

## 2015-10-21 VITALS — BP 147/77 | HR 91 | Temp 98.5°F | Resp 18 | Ht 65.0 in | Wt 196.0 lb

## 2015-10-21 DIAGNOSIS — D508 Other iron deficiency anemias: Secondary | ICD-10-CM | POA: Insufficient documentation

## 2015-10-21 DIAGNOSIS — D631 Anemia in chronic kidney disease: Secondary | ICD-10-CM

## 2015-10-21 DIAGNOSIS — N184 Chronic kidney disease, stage 4 (severe): Secondary | ICD-10-CM

## 2015-10-21 DIAGNOSIS — N183 Chronic kidney disease, stage 3 unspecified: Secondary | ICD-10-CM

## 2015-10-21 DIAGNOSIS — D509 Iron deficiency anemia, unspecified: Secondary | ICD-10-CM

## 2015-10-21 DIAGNOSIS — D5720 Sickle-cell/Hb-C disease without crisis: Secondary | ICD-10-CM

## 2015-10-21 DIAGNOSIS — D696 Thrombocytopenia, unspecified: Secondary | ICD-10-CM

## 2015-10-21 DIAGNOSIS — R161 Splenomegaly, not elsewhere classified: Secondary | ICD-10-CM | POA: Diagnosis not present

## 2015-10-21 DIAGNOSIS — D571 Sickle-cell disease without crisis: Secondary | ICD-10-CM

## 2015-10-21 DIAGNOSIS — D57219 Sickle-cell/Hb-C disease with crisis, unspecified: Secondary | ICD-10-CM

## 2015-10-21 HISTORY — DX: Other iron deficiency anemias: D50.8

## 2015-10-21 LAB — IRON AND TIBC
%SAT: 32 % (ref 21–57)
IRON: 72 ug/dL (ref 41–142)
TIBC: 228 ug/dL — AB (ref 236–444)
UIBC: 156 ug/dL (ref 120–384)

## 2015-10-21 LAB — COMPREHENSIVE METABOLIC PANEL
ALBUMIN: 4.1 g/dL (ref 3.5–5.0)
ALK PHOS: 97 U/L (ref 40–150)
ALT: 9 U/L (ref 0–55)
ANION GAP: 12 meq/L — AB (ref 3–11)
AST: 14 U/L (ref 5–34)
BILIRUBIN TOTAL: 1.33 mg/dL — AB (ref 0.20–1.20)
BUN: 27 mg/dL — ABNORMAL HIGH (ref 7.0–26.0)
CO2: 17 mEq/L — ABNORMAL LOW (ref 22–29)
Calcium: 8.8 mg/dL (ref 8.4–10.4)
Chloride: 113 mEq/L — ABNORMAL HIGH (ref 98–109)
Creatinine: 1.8 mg/dL — ABNORMAL HIGH (ref 0.6–1.1)
EGFR: 33 mL/min/{1.73_m2} — AB (ref >90)
Glucose: 180 mg/dl — ABNORMAL HIGH (ref 70–140)
POTASSIUM: 3.9 meq/L (ref 3.5–5.1)
SODIUM: 142 meq/L (ref 136–145)
TOTAL PROTEIN: 7.4 g/dL (ref 6.4–8.3)

## 2015-10-21 LAB — CBC WITH DIFFERENTIAL (CANCER CENTER ONLY)
BASO#: 0 10*3/uL (ref 0.0–0.2)
BASO%: 0.4 % (ref 0.0–2.0)
EOS ABS: 0.1 10*3/uL (ref 0.0–0.5)
EOS%: 1.7 % (ref 0.0–7.0)
HCT: 23.4 % — ABNORMAL LOW (ref 34.8–46.6)
HEMOGLOBIN: 7.9 g/dL — AB (ref 11.6–15.9)
LYMPH#: 2.4 10*3/uL (ref 0.9–3.3)
LYMPH%: 31.2 % (ref 14.0–48.0)
MCH: 23.7 pg — AB (ref 26.0–34.0)
MCHC: 33.8 g/dL (ref 32.0–36.0)
MCV: 70 fL — AB (ref 81–101)
MONO#: 0.5 10*3/uL (ref 0.1–0.9)
MONO%: 5.9 % (ref 0.0–13.0)
NEUT%: 60.8 % (ref 39.6–80.0)
NEUTROS ABS: 4.7 10*3/uL (ref 1.5–6.5)
Platelets: 68 10*3/uL — ABNORMAL LOW (ref 145–400)
RBC: 3.34 10*6/uL — AB (ref 3.70–5.32)
RDW: 23.8 % — ABNORMAL HIGH (ref 11.1–15.7)
WBC: 7.7 10*3/uL (ref 3.9–10.0)

## 2015-10-21 LAB — FERRITIN

## 2015-10-21 MED ORDER — EPOETIN ALFA 40000 UNIT/ML IJ SOLN
INTRAMUSCULAR | Status: AC
  Filled 2015-10-21: qty 1

## 2015-10-21 MED ORDER — EPOETIN ALFA 40000 UNIT/ML IJ SOLN
40000.0000 [IU] | Freq: Once | INTRAMUSCULAR | Status: AC
  Administered 2015-10-21: 40000 [IU] via SUBCUTANEOUS

## 2015-10-21 NOTE — Progress Notes (Signed)
Hematology and Oncology Follow Up Visit  Rachel Palmer [id number] 08/31/1941 75 y.o. 10/21/2015   Principle Diagnosis:  1. Sickle cell disease 2. 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 Chronic splenomegaly 4. Chronic thrombocytopenia  5.  IDDM 6. Iron deficiency anemia  Current Therapy:   Procrit 40,000 units for Hgb < 10 IV iron with Feraheme - dose given 10/22/2015    Interim History:  Rachel Palmer is here today for a follow-up. She is feeling better and her energy continues to improve.  She has not had a [place] crisis in quite a while. She has no c/o tenderness, numbness or tingling in her extremities.  No fever, chills, n/v, cough, rash, dizziness, chest pain, palpitations or changes in her bowel or bladder habits. Some mild SOB with exertion that resolves with rest.  She is still having some right lower quadrant pain that comes and goes. This area is slightly tender on palpation today. No organomegaly found on exam.  No syncopal episodes of falls.  She is eating healthy and staying well hydrated. Her weight is unchanged.   Medications:    Medication List       This list is accurate as of: 10/21/15  1:21 PM.  Always use your most recent med list.               albuterol 108 (90 Base) MCG/ACT inhaler  Commonly known as:  PROVENTIL HFA;VENTOLIN HFA  Inhale 2 puffs into the lungs every 6 (six) hours as needed for wheezing or shortness of breath.     allopurinol 100 MG tablet  Commonly known as:  ZYLOPRIM  Take 100 mg by mouth every other day.     cetirizine 10 MG tablet  Commonly known as:  ZYRTEC  Take 10 mg by mouth daily.     ergocalciferol 50000 units capsule  Commonly known as:  VITAMIN D2  Take 50,000 Units by mouth once a week. Tuesday     Fluticasone-Salmeterol 500-50 MCG/DOSE Aepb  Commonly known as:  ADVAIR  Inhale 1 puff into the lungs 2 (two) times daily.     folic acid 1 MG tablet  Commonly known as:  FOLVITE  Take 2 tablets (2 mg total) by mouth  daily.     furosemide 20 MG tablet  Commonly known as:  LASIX  Take 20 mg by mouth as needed for fluid.     hydrochlorothiazide 25 MG tablet  Commonly known as:  HYDRODIURIL     insulin aspart protamine- aspart (70-30) 100 UNIT/ML injection  Commonly known as:  NOVOLOG MIX 70/30  Inject 35-38 Units into the skin 2 (two) times daily with a meal. 35 in the morning and 38 in the evening     montelukast 10 MG tablet  Commonly known as:  SINGULAIR  Take 10 mg by mouth every morning.     multivitamin with minerals Tabs tablet  Take 1 tablet by mouth daily.     omeprazole 40 MG capsule  Commonly known as:  PRILOSEC  Take 40 mg by mouth daily.     oxycodone 5 MG capsule  Commonly known as:  OXY-IR  Take 1 capsule (5 mg total) by mouth every 4 (four) hours as needed for pain.     sertraline 100 MG tablet  Commonly known as:  ZOLOFT  Take 100 mg by mouth daily.     TOPROL XL PO  Take 100 mg by mouth daily.  Allergies:  Allergies  Allergen Reactions  . Codeine Hives  . Heparin [facility]  . Iohexol Hives    Synthetic xray dye=iohexol   . Ivp Dye [Iodinated Diagnostic Agents] Hives  . Keflex [Cephalexin] Hives  . Propoxyphene     Loopy  . Tape Rash    Skin peels    Past Medical History, Surgical history, Social history, and Family History were reviewed and updated.  Review of Systems: All other 10 point review of systems is negative.   Physical Exam:  height is 5\' 5"  (1.651 m) and weight is 196 lb (88.905 kg). Her oral temperature is 98.5 F (36.9 C). Her blood pressure is 147/77 and her pulse is 91. Her respiration is 18.   Wt Readings from Last 3 Encounters:  10/21/15 196 lb (88.905 kg)  09/16/15 198 lb (89.812 kg)  09/01/15 197 lb (89.359 kg)    Ocular: Sclerae unicteric, pupils equal, round and reactive to light Ear-nose-throat: Oropharynx clear, dentition fair Lymphatic: No cervical supraclavicular or axillary adenopathy Lungs no rales  or rhonchi, good excursion bilaterally Heart regular rate and rhythm, no murmur appreciated Abd soft, right lower quadrant slightly tender, positive bowel sounds, no liver or spleen tip found on exam MSK no focal spinal tenderness, no joint edema Neuro: non-focal, well-oriented, appropriate affect Breasts: Deferred  Lab Results  Component Value Date   WBC 7.7 10/21/2015   HGB 7.9* 10/21/2015   HCT 23.4* 10/21/2015   MCV 70* 10/21/2015   PLT 68* 10/21/2015   Lab Results  Component Value Date   FERRITIN 1,923* 09/16/2015   IRON 62 09/16/2015   TIBC 211* 09/16/2015   UIBC 150 09/16/2015   IRONPCTSAT 29 09/16/2015   Lab Results  Component Value Date   RETICCTPCT 3.2* 09/16/2015   RBC 3.34* 10/21/2015   RETICCTABS 135.0 09/16/2015   No results found for: KPAFRELGTCHN, LAMBDASER, KAPLAMBRATIO No results found for: IGGSERUM, IGA, IGMSERUM No results found for: Odetta Pink, SPEI   Chemistry      Component Value Date/Time   NA 143 09/16/2015 1057   NA 142 08/11/2015 1503   NA 143 01/02/2013 1433   K 5.2* 09/16/2015 1057   K 4.6 08/11/2015 1503   K 4.3 01/02/2013 1433   CL 115* 08/11/2015 1503   CL 112* 01/02/2013 1433   CO2 21* 09/16/2015 1057   CO2 16* 08/11/2015 1503   CO2 24 01/02/2013 1433   BUN 26.3* 09/16/2015 1057   BUN 41* 08/11/2015 1503   BUN 21 01/02/2013 1433   CREATININE 1.9* 09/16/2015 1057   CREATININE 1.66* 08/11/2015 1503   CREATININE 1.5* 01/02/2013 1433      Component Value Date/Time   CALCIUM 8.7 09/16/2015 1057   CALCIUM 8.4* 08/11/2015 1503   CALCIUM 8.8 01/02/2013 1433   ALKPHOS 99 09/16/2015 1057   ALKPHOS 86 08/11/2015 1503   ALKPHOS 104* 08/23/2012 0855   AST 14 09/16/2015 1057   AST 12 08/11/2015 1503   AST 16 08/23/2012 0855   ALT 10 09/16/2015 1057   ALT 9 08/11/2015 1503   ALT 17 08/23/2012 0855   BILITOT 0.76 09/16/2015 1057   BILITOT 0.8 08/11/2015 1503   BILITOT 0.80  08/23/2012 0855     Impression and Plan: Rachel Palmer is a very pleasant 75 yo African American female with multiple hematologic issues. She is doing well and feeling much better since her last visit.   Her Hgb is 7.9 with an MCV  of 70. Her ferritin remains elevated at 1,923. However, her iron saturation is quite low. As such, I really think that she is iron deficient.  I will go ahead and give her Procrit today. I will like to give her iron tomorrow. It will be interesting to see how well this improves her anemia.  Her diabetes still is going to be a big problem for her. I'm not sure if her family doctor is managing this area I would like to see her back in 3 weeks.  I spent about 30 minutes with her today.  Volanda Napoleon, MD 2/2/20171:21 PM

## 2015-10-21 NOTE — Patient Instructions (Signed)
Epoetin Alfa injection What is this medicine? EPOETIN ALFA (e POE e tin AL fa) helps your body make more red blood cells. This medicine is used to treat anemia caused by chronic kidney failure, cancer chemotherapy, or HIV-therapy. It may also be used before surgery if you have anemia. This medicine may be used for other purposes; ask your health care provider or pharmacist if you have questions. What should I tell my health care provider before I take this medicine? They need to know if you have any of these conditions: -blood clotting disorders -cancer patient not on chemotherapy -cystic fibrosis -heart disease, such as angina or heart failure -hemoglobin level of 12 g/dL or greater -high blood pressure -low levels of folate, iron, or vitamin B12 -seizures -an unusual or allergic reaction to erythropoietin, albumin, benzyl alcohol, hamster proteins, other medicines, foods, dyes, or preservatives -pregnant or trying to get pregnant -breast-feeding How should I use this medicine? This medicine is for injection into a vein or under the skin. It is usually given by a health care professional in a hospital or clinic setting. If you get this medicine at home, you will be taught how to prepare and give this medicine. Use exactly as directed. Take your medicine at regular intervals. Do not take your medicine more often than directed. It is important that you put your used needles and syringes in a special sharps container. Do not put them in a trash can. If you do not have a sharps container, call your pharmacist or healthcare provider to get one. Talk to your pediatrician regarding the use of this medicine in children. While this drug may be prescribed for selected conditions, precautions do apply. Overdosage: If you think you have taken too much of this medicine contact a poison control center or emergency room at once. NOTE: This medicine is only for you. Do not share this medicine with  others. What if I miss a dose? If you miss a dose, take it as soon as you can. If it is almost time for your next dose, take only that dose. Do not take double or extra doses. What may interact with this medicine? Do not take this medicine with any of the following medications: -darbepoetin alfa This list may not describe all possible interactions. Give your health care provider a list of all the medicines, herbs, non-prescription drugs, or dietary supplements you use. Also tell them if you smoke, drink alcohol, or use illegal drugs. Some items may interact with your medicine. What should I watch for while using this medicine? Visit your prescriber or health care professional for regular checks on your progress and for the needed blood tests and blood pressure measurements. It is especially important for the doctor to make sure your hemoglobin level is in the desired range, to limit the risk of potential side effects and to give you the best benefit. Keep all appointments for any recommended tests. Check your blood pressure as directed. Ask your doctor what your blood pressure should be and when you should contact him or her. As your body makes more red blood cells, you may need to take iron, folic acid, or vitamin B supplements. Ask your doctor or health care provider which products are right for you. If you have kidney disease continue dietary restrictions, even though this medication can make you feel better. Talk with your doctor or health care professional about the foods you eat and the vitamins that you take. What side effects may I notice   from receiving this medicine? Side effects that you should report to your doctor or health care professional as soon as possible: -allergic reactions like skin rash, itching or hives, swelling of the face, lips, or tongue -breathing problems -changes in vision -chest pain -confusion, trouble speaking or understanding -feeling faint or lightheaded,  falls -high blood pressure -muscle aches or pains -pain, swelling, warmth in the leg -rapid weight gain -severe headaches -sudden numbness or weakness of the face, arm or leg -trouble walking, dizziness, loss of balance or coordination -seizures (convulsions) -swelling of the ankles, feet, hands -unusually weak or tired Side effects that usually do not require medical attention (report to your doctor or health care professional if they continue or are bothersome): -diarrhea -fever, chills (flu-like symptoms) -headaches -nausea, vomiting -redness, stinging, or swelling at site where injected This list may not describe all possible side effects. Call your doctor for medical advice about side effects. You may report side effects to FDA at 1-800-FDA-1088. Where should I keep my medicine? Keep out of the reach of children. Store in a refrigerator between 2 and 8 degrees C (36 and 46 degrees F). Do not freeze or shake. Throw away any unused portion if using a single-dose vial. Multi-dose vials can be kept in the refrigerator for up to 21 days after the initial dose. Throw away unused medicine. NOTE: This sheet is a summary. It may not cover all possible information. If you have questions about this medicine, talk to your doctor, pharmacist, or health care provider.    2016, Elsevier/Gold Standard. (2008-08-18 10:25:44)  

## 2015-10-22 ENCOUNTER — Other Ambulatory Visit: Payer: Self-pay | Admitting: Family

## 2015-10-22 ENCOUNTER — Ambulatory Visit (HOSPITAL_BASED_OUTPATIENT_CLINIC_OR_DEPARTMENT_OTHER): Payer: Medicare Other

## 2015-10-22 VITALS — BP 143/73 | HR 87 | Temp 98.2°F | Resp 18

## 2015-10-22 DIAGNOSIS — D509 Iron deficiency anemia, unspecified: Secondary | ICD-10-CM

## 2015-10-22 DIAGNOSIS — D508 Other iron deficiency anemias: Secondary | ICD-10-CM

## 2015-10-22 LAB — RETICULOCYTES: RETICULOCYTE COUNT: 7.5 % — AB (ref 0.6–2.6)

## 2015-10-22 LAB — SEDIMENTATION RATE: Sedimentation Rate-Westergren: 10 mm/hr (ref 0–40)

## 2015-10-22 MED ORDER — SODIUM CHLORIDE 0.9 % IV SOLN
510.0000 mg | Freq: Once | INTRAVENOUS | Status: AC
  Administered 2015-10-22: 510 mg via INTRAVENOUS
  Filled 2015-10-22: qty 17

## 2015-10-22 NOTE — Patient Instructions (Signed)

## 2015-11-11 ENCOUNTER — Ambulatory Visit: Payer: Medicare Other

## 2015-11-11 ENCOUNTER — Other Ambulatory Visit: Payer: Medicare Other

## 2015-11-11 ENCOUNTER — Ambulatory Visit: Payer: Medicare Other | Admitting: Hematology & Oncology

## 2015-11-18 ENCOUNTER — Telehealth: Payer: Self-pay | Admitting: Hematology & Oncology

## 2015-11-18 NOTE — Telephone Encounter (Signed)
Patient called and cx 11/26/15 apt and resch for 11/24/15

## 2015-11-20 ENCOUNTER — Inpatient Hospital Stay (HOSPITAL_BASED_OUTPATIENT_CLINIC_OR_DEPARTMENT_OTHER)
Admission: EM | Admit: 2015-11-20 | Discharge: 2015-11-26 | DRG: 812 | Disposition: A | Discharge status: Home / Self Care | Payer: Medicare Other | Attending: Internal Medicine | Admitting: Internal Medicine

## 2015-11-20 ENCOUNTER — Encounter (HOSPITAL_BASED_OUTPATIENT_CLINIC_OR_DEPARTMENT_OTHER): Payer: Self-pay | Admitting: Emergency Medicine

## 2015-11-20 DIAGNOSIS — D5700 Hb-SS disease with crisis, unspecified: Secondary | ICD-10-CM | POA: Diagnosis not present

## 2015-11-20 DIAGNOSIS — D5720 Sickle-cell/Hb-C disease without crisis: Secondary | ICD-10-CM | POA: Diagnosis present

## 2015-11-20 DIAGNOSIS — IMO0001 Reserved for inherently not codable concepts without codable children: Secondary | ICD-10-CM

## 2015-11-20 DIAGNOSIS — D57219 Sickle-cell/Hb-C disease with crisis, unspecified: Secondary | ICD-10-CM | POA: Diagnosis not present

## 2015-11-20 DIAGNOSIS — R161 Splenomegaly, not elsewhere classified: Secondary | ICD-10-CM | POA: Diagnosis present

## 2015-11-20 DIAGNOSIS — D649 Anemia, unspecified: Secondary | ICD-10-CM | POA: Diagnosis present

## 2015-11-20 DIAGNOSIS — Z87891 Personal history of nicotine dependence: Secondary | ICD-10-CM

## 2015-11-20 DIAGNOSIS — M25512 Pain in left shoulder: Secondary | ICD-10-CM | POA: Diagnosis present

## 2015-11-20 DIAGNOSIS — D696 Thrombocytopenia, unspecified: Secondary | ICD-10-CM | POA: Diagnosis present

## 2015-11-20 DIAGNOSIS — E119 Type 2 diabetes mellitus without complications: Secondary | ICD-10-CM

## 2015-11-20 DIAGNOSIS — R000 Tachycardia, unspecified: Secondary | ICD-10-CM | POA: Diagnosis present

## 2015-11-20 DIAGNOSIS — Z794 Long term (current) use of insulin: Secondary | ICD-10-CM

## 2015-11-20 DIAGNOSIS — D509 Iron deficiency anemia, unspecified: Secondary | ICD-10-CM

## 2015-11-20 DIAGNOSIS — I129 Hypertensive chronic kidney disease with stage 1 through stage 4 chronic kidney disease, or unspecified chronic kidney disease: Secondary | ICD-10-CM | POA: Diagnosis present

## 2015-11-20 DIAGNOSIS — Z79899 Other long term (current) drug therapy: Secondary | ICD-10-CM

## 2015-11-20 DIAGNOSIS — R0609 Other forms of dyspnea: Secondary | ICD-10-CM | POA: Insufficient documentation

## 2015-11-20 DIAGNOSIS — Z9049 Acquired absence of other specified parts of digestive tract: Secondary | ICD-10-CM

## 2015-11-20 DIAGNOSIS — N184 Chronic kidney disease, stage 4 (severe): Secondary | ICD-10-CM | POA: Diagnosis present

## 2015-11-20 DIAGNOSIS — E1122 Type 2 diabetes mellitus with diabetic chronic kidney disease: Secondary | ICD-10-CM | POA: Diagnosis present

## 2015-11-20 DIAGNOSIS — D638 Anemia in other chronic diseases classified elsewhere: Secondary | ICD-10-CM | POA: Insufficient documentation

## 2015-11-20 DIAGNOSIS — D72829 Elevated white blood cell count, unspecified: Secondary | ICD-10-CM | POA: Diagnosis present

## 2015-11-20 LAB — BASIC METABOLIC PANEL
ANION GAP: 9 (ref 5–15)
BUN: 24 mg/dL — ABNORMAL HIGH (ref 6–20)
CALCIUM: 8.5 mg/dL — AB (ref 8.9–10.3)
CO2: 15 mmol/L — ABNORMAL LOW (ref 22–32)
Chloride: 116 mmol/L — ABNORMAL HIGH (ref 101–111)
Creatinine, Ser: 1.8 mg/dL — ABNORMAL HIGH (ref 0.44–1.00)
GFR, EST AFRICAN AMERICAN: 31 mL/min — AB (ref >60)
GFR, EST NON AFRICAN AMERICAN: 27 mL/min — AB (ref >60)
Glucose, Bld: 129 mg/dL — ABNORMAL HIGH (ref 65–99)
Potassium: 4.9 mmol/L (ref 3.5–5.1)
Sodium: 140 mmol/L (ref 135–145)

## 2015-11-20 LAB — CBC WITH DIFFERENTIAL/PLATELET
BASOS ABS: 0 10*3/uL (ref 0.0–0.1)
Basophils Relative: 0 %
EOS ABS: 0 10*3/uL (ref 0.0–0.7)
Eosinophils Relative: 0 %
HCT: 15.3 % — ABNORMAL LOW (ref 36.0–46.0)
Hemoglobin: 5.2 g/dL — CL (ref 12.0–15.0)
LYMPHS PCT: 21 %
Lymphs Abs: 2.9 10*3/uL (ref 0.7–4.0)
MCH: 23.9 pg — ABNORMAL LOW (ref 26.0–34.0)
MCHC: 34 g/dL (ref 30.0–36.0)
MCV: 70.2 fL — ABNORMAL LOW (ref 78.0–100.0)
Monocytes Absolute: 1.3 10*3/uL — ABNORMAL HIGH (ref 0.1–1.0)
Monocytes Relative: 9 %
NEUTROS PCT: 70 %
Neutro Abs: 9.7 10*3/uL — ABNORMAL HIGH (ref 1.7–7.7)
RBC: 2.18 MIL/uL — AB (ref 3.87–5.11)
RDW: 21.9 % — ABNORMAL HIGH (ref 11.5–15.5)
SMEAR REVIEW: DECREASED
WBC: 13.9 10*3/uL — ABNORMAL HIGH (ref 4.0–10.5)

## 2015-11-20 MED ORDER — SODIUM CHLORIDE 0.9 % IV BOLUS (SEPSIS)
500.0000 mL | Freq: Once | INTRAVENOUS | Status: AC
  Administered 2015-11-20: 500 mL via INTRAVENOUS

## 2015-11-20 MED ORDER — HYDROMORPHONE HCL 1 MG/ML IJ SOLN
1.0000 mg | Freq: Once | INTRAMUSCULAR | Status: AC
  Administered 2015-11-20: 1 mg via INTRAVENOUS
  Filled 2015-11-20: qty 1

## 2015-11-20 NOTE — ED Notes (Signed)
Pt in c/o sickle cell pain crisis onset today early this am. States pain is usually on left side but today is all over.

## 2015-11-20 NOTE — Progress Notes (Signed)
5 yof with SCD followed by Dr. Marin Olp presents to [facility] in pain crisis with Hgb 5.9, down from baseline of 7-8. Pain is in extremities and consistent with prior crises. Accepted to SD unit at [facility] for transfusion of pRBC and management of pain crisis.

## 2015-11-20 NOTE — ED Provider Notes (Signed)
CSN: [id number]     Arrival date & time 11/20/15  2137 History  By signing my name below, I, Rachel Palmer, attest that this documentation has been prepared under the direction and in the presence of Malvin Johns, MD. Electronically Signed: [name], ED Scribe. 11/20/2015. 10:09 PM   Chief Complaint  Patient presents with  . Sickle Cell Pain Crisis   The history is provided by the patient. No language interpreter was used.   Rachel Palmer is a 75 y.o. female with history of sickle cell disease type [place], HTN, and DM who presents to the Emergency Department complaining of constant, 10/10 in severity,  diffuse pain with onset 19-20 hours ago. She states that this pain is more severe and diffuse than her usual left sided sickle cell pain. She last had a pain crisis like this 1 year ago. 5 mg of Hydromorphone provided insufficient relief in pain at home.  Pt denies fever, rhinorrhea, congestion, SOB, difficulty urinating, abdominal pain, vomiting, and diarrhea.  Past Medical History  Diagnosis Date  . Hypertension   . Sickle cell disease ([place])   . Diabetes mellitus   . Anemia, chronic renal failure 08/04/2011  . Sickle cell disease, type [place] ([place]) 08/04/2011  . Blood transfusion without reported diagnosis   . Fatty liver   . Other iron deficiency anemias 10/21/2015   Past Surgical History  Procedure Laterality Date  . Cholecystectomy    . Shoulder surgery    . Appendectomy    . Knee surgery     Family History  Problem Relation Age of Onset  . Stroke Mother    Social History  Substance Use Topics  . Smoking status: Former Research scientist (life sciences)  . Smokeless tobacco: None  . Alcohol Use: No   OB History    No data available     Review of Systems  Constitutional: Negative for fever, chills, diaphoresis and fatigue.  HENT: Negative for congestion, rhinorrhea and sneezing.   Eyes: Negative.   Respiratory: Negative for cough, chest tightness and shortness of breath.   Cardiovascular:  Negative for chest pain and leg swelling.  Gastrointestinal: Negative for nausea, vomiting, abdominal pain, diarrhea and blood in stool.  Genitourinary: Negative for dysuria, frequency, hematuria, flank pain and difficulty urinating.  Musculoskeletal: Positive for myalgias and arthralgias. Negative for back pain.  Skin: Negative for rash.  Neurological: Negative for dizziness, speech difficulty, weakness, numbness and headaches.      Allergies  Codeine; Heparin cross reactors; Iohexol; Ivp dye; Keflex; Propoxyphene; and Tape  Home Medications   Prior to Admission medications   Medication Sig Start Date End Date Taking? Authorizing Provider  albuterol (PROVENTIL HFA;VENTOLIN HFA) 108 (90 BASE) MCG/ACT inhaler Inhale 2 puffs into the lungs every 6 (six) hours as needed for wheezing or shortness of breath.     Historical Provider, MD  allopurinol (ZYLOPRIM) 100 MG tablet Take 100 mg by mouth every other day.  06/24/15   Historical Provider, MD  cetirizine (ZYRTEC) 10 MG tablet Take 10 mg by mouth daily.     Historical Provider, MD  ergocalciferol (VITAMIN D2) 50000 UNITS capsule Take 50,000 Units by mouth once a week. Tuesday    Historical Provider, MD  Fluticasone-Salmeterol (ADVAIR) 500-50 MCG/DOSE AEPB Inhale 1 puff into the lungs 2 (two) times daily.    Historical Provider, MD  folic acid (FOLVITE) 1 MG tablet Take 2 tablets (2 mg total) by mouth daily. 08/05/15   Modena Jansky, MD  furosemide (LASIX) 20 MG  tablet Take 20 mg by mouth as needed for fluid.  06/24/15   Historical Provider, MD  hydrochlorothiazide (HYDRODIURIL) 25 MG tablet  09/04/15   Historical Provider, MD  insulin aspart protamine- aspart (NOVOLOG MIX 70/30) (70-30) 100 UNIT/ML injection Inject 35-38 Units into the skin 2 (two) times daily with a meal. 35 in the morning and 38 in the evening    Historical Provider, MD  Metoprolol Succinate (TOPROL XL PO) Take 100 mg by mouth daily.     Historical Provider, MD   montelukast (SINGULAIR) 10 MG tablet Take 10 mg by mouth every morning.    Historical Provider, MD  Multiple Vitamin (MULTIVITAMIN WITH MINERALS) TABS tablet Take 1 tablet by mouth daily.    Historical Provider, MD  omeprazole (PRILOSEC) 40 MG capsule Take 40 mg by mouth daily. 07/08/15   Historical Provider, MD  oxycodone (OXY-IR) 5 MG capsule Take 1 capsule (5 mg total) by mouth every 4 (four) hours as needed for pain. Patient taking differently: Take 5 mg by mouth every 6 (six) hours as needed for pain.  08/06/15   Charlynne Cousins, MD  sertraline (ZOLOFT) 100 MG tablet Take 100 mg by mouth daily.    Historical Provider, MD   BP 117/53 mmHg  Pulse 78  Temp(Src) 97.9 F (36.6 C) (Oral)  Resp 18  Ht 5\' 5"  (1.651 m)  Wt 190 lb (86.183 kg)  BMI 31.62 kg/m2  SpO2 100% Physical Exam  Constitutional: She is oriented to person, place, and time. She appears well-developed and well-nourished. No distress.  HENT:  Head: Normocephalic and atraumatic.  Eyes: EOM are normal. Pupils are equal, round, and reactive to light.  Neck: Normal range of motion. Neck supple.  Cardiovascular: Normal rate, regular rhythm and normal heart sounds.   Pulmonary/Chest: Effort normal and breath sounds normal. No respiratory distress. She has no wheezes. She has no rales. She exhibits no tenderness.  Abdominal: Soft. Bowel sounds are normal. She exhibits no distension. There is no tenderness. There is no rebound and no guarding.  Musculoskeletal: Normal range of motion. She exhibits no edema.  No swelling or redness of the joints  Lymphadenopathy:    She has no cervical adenopathy.  Neurological: She is alert and oriented to person, place, and time.  Skin: Skin is warm and dry. No rash noted.  Psychiatric: She has a normal mood and affect. Judgment normal.  Nursing note and vitals reviewed.   ED Course  Procedures (including critical care time) DIAGNOSTIC STUDIES: Oxygen Saturation is 100% on RA,   normal by my interpretation.    COORDINATION OF CARE: 10:08 PM Discussed treatment plan which includes lab work and pain management with pt at bedside and pt agreed to plan.  Labs Review Labs Reviewed  BASIC METABOLIC PANEL - Abnormal; Notable for the following:    Chloride 116 (*)    CO2 15 (*)    Glucose, Bld 129 (*)    BUN 24 (*)    Creatinine, Ser 1.80 (*)    Calcium 8.5 (*)    GFR calc non Af Amer 27 (*)    GFR calc Af Amer 31 (*)    All other components within normal limits  CBC WITH DIFFERENTIAL/PLATELET - Abnormal; Notable for the following:    WBC 13.9 (*)    RBC 2.18 (*)    Hemoglobin 5.2 (*)    HCT 15.3 (*)    MCV 70.2 (*)    MCH 23.9 (*)    RDW  21.9 (*)    Neutro Abs 9.7 (*)    Monocytes Absolute 1.3 (*)    All other components within normal limits  RETICULOCYTES    Imaging Review No results found. I have personally reviewed and evaluated these lab results as part of my medical decision-making.   EKG Interpretation None      MDM   Final diagnoses:  Iron deficiency anemia  Sickle cell pain crisis ([place])    Patient with a history of sickle cell [place] disease. She presents with a pain crises. She was given a dose of Dilaudid and is feeling better after this. She has chronic anemia and is followed by Dr. Marin Olp. Her baseline hemoglobins are around 7-8. She is getting iron transfusions and Procrit. Today her hemoglobin is 5.2 which is dropped from her baseline values. Given this, I feel that she needs to be admitted for blood transfusion. I will consult the hospitalist to [facility] long.  I spoke with Dr. Myna Hidalgo who has accepted pt to [facility], step-down bed, full admission  I personally performed the services described in this documentation, which was scribed in my presence.  The recorded information has been reviewed and considered.     Malvin Johns, MD 11/21/15 0002

## 2015-11-20 NOTE — ED Notes (Signed)
Pt placed on auto vitals Q15.

## 2015-11-21 ENCOUNTER — Encounter (HOSPITAL_COMMUNITY): Payer: Self-pay | Admitting: Family Medicine

## 2015-11-21 DIAGNOSIS — D5700 Hb-SS disease with crisis, unspecified: Secondary | ICD-10-CM | POA: Diagnosis present

## 2015-11-21 DIAGNOSIS — M25512 Pain in left shoulder: Secondary | ICD-10-CM | POA: Diagnosis present

## 2015-11-21 DIAGNOSIS — Z794 Long term (current) use of insulin: Secondary | ICD-10-CM | POA: Diagnosis not present

## 2015-11-21 DIAGNOSIS — IMO0001 Reserved for inherently not codable concepts without codable children: Secondary | ICD-10-CM

## 2015-11-21 DIAGNOSIS — D509 Iron deficiency anemia, unspecified: Secondary | ICD-10-CM | POA: Diagnosis not present

## 2015-11-21 DIAGNOSIS — D57219 Sickle-cell/Hb-C disease with crisis, unspecified: Secondary | ICD-10-CM | POA: Diagnosis present

## 2015-11-21 DIAGNOSIS — N184 Chronic kidney disease, stage 4 (severe): Secondary | ICD-10-CM | POA: Diagnosis present

## 2015-11-21 DIAGNOSIS — Z87891 Personal history of nicotine dependence: Secondary | ICD-10-CM | POA: Diagnosis not present

## 2015-11-21 DIAGNOSIS — R0609 Other forms of dyspnea: Secondary | ICD-10-CM | POA: Diagnosis not present

## 2015-11-21 DIAGNOSIS — N19 Unspecified kidney failure: Secondary | ICD-10-CM | POA: Diagnosis not present

## 2015-11-21 DIAGNOSIS — D649 Anemia, unspecified: Secondary | ICD-10-CM | POA: Diagnosis not present

## 2015-11-21 DIAGNOSIS — E119 Type 2 diabetes mellitus without complications: Secondary | ICD-10-CM | POA: Diagnosis not present

## 2015-11-21 DIAGNOSIS — I129 Hypertensive chronic kidney disease with stage 1 through stage 4 chronic kidney disease, or unspecified chronic kidney disease: Secondary | ICD-10-CM | POA: Diagnosis present

## 2015-11-21 DIAGNOSIS — E1122 Type 2 diabetes mellitus with diabetic chronic kidney disease: Secondary | ICD-10-CM | POA: Diagnosis present

## 2015-11-21 DIAGNOSIS — D696 Thrombocytopenia, unspecified: Secondary | ICD-10-CM | POA: Diagnosis present

## 2015-11-21 DIAGNOSIS — D638 Anemia in other chronic diseases classified elsewhere: Secondary | ICD-10-CM | POA: Diagnosis not present

## 2015-11-21 DIAGNOSIS — Z79899 Other long term (current) drug therapy: Secondary | ICD-10-CM | POA: Diagnosis not present

## 2015-11-21 DIAGNOSIS — R161 Splenomegaly, not elsewhere classified: Secondary | ICD-10-CM | POA: Diagnosis present

## 2015-11-21 DIAGNOSIS — Z9049 Acquired absence of other specified parts of digestive tract: Secondary | ICD-10-CM | POA: Diagnosis not present

## 2015-11-21 DIAGNOSIS — R000 Tachycardia, unspecified: Secondary | ICD-10-CM | POA: Diagnosis present

## 2015-11-21 DIAGNOSIS — R0600 Dyspnea, unspecified: Secondary | ICD-10-CM | POA: Diagnosis not present

## 2015-11-21 DIAGNOSIS — D72829 Elevated white blood cell count, unspecified: Secondary | ICD-10-CM | POA: Diagnosis present

## 2015-11-21 LAB — COMPREHENSIVE METABOLIC PANEL
ALT: 12 U/L — ABNORMAL LOW (ref 14–54)
AST: 24 U/L (ref 15–41)
Albumin: 3.7 g/dL (ref 3.5–5.0)
Alkaline Phosphatase: 84 U/L (ref 38–126)
Anion gap: 7 (ref 5–15)
BILIRUBIN TOTAL: 1.2 mg/dL (ref 0.3–1.2)
BUN: 23 mg/dL — AB (ref 6–20)
CO2: 15 mmol/L — ABNORMAL LOW (ref 22–32)
CREATININE: 1.85 mg/dL — AB (ref 0.44–1.00)
Calcium: 8.1 mg/dL — ABNORMAL LOW (ref 8.9–10.3)
Chloride: 117 mmol/L — ABNORMAL HIGH (ref 101–111)
GFR calc Af Amer: 30 mL/min — ABNORMAL LOW (ref >60)
GFR, EST NON AFRICAN AMERICAN: 26 mL/min — AB (ref >60)
Glucose, Bld: 125 mg/dL — ABNORMAL HIGH (ref 65–99)
POTASSIUM: 4.5 mmol/L (ref 3.5–5.1)
Sodium: 139 mmol/L (ref 135–145)
TOTAL PROTEIN: 6.5 g/dL (ref 6.5–8.1)

## 2015-11-21 LAB — CBC WITH DIFFERENTIAL/PLATELET
BAND NEUTROPHILS: 0 %
BASOS ABS: 0 10*3/uL (ref 0.0–0.1)
BASOS PCT: 0 %
Blasts: 0 %
EOS ABS: 0.1 10*3/uL (ref 0.0–0.7)
Eosinophils Relative: 1 %
HCT: 15.4 % — ABNORMAL LOW (ref 36.0–46.0)
Hemoglobin: 5.1 g/dL — CL (ref 12.0–15.0)
Lymphocytes Relative: 31 %
Lymphs Abs: 3.1 10*3/uL (ref 0.7–4.0)
MCH: 23.2 pg — ABNORMAL LOW (ref 26.0–34.0)
MCHC: 33.1 g/dL (ref 30.0–36.0)
MCV: 70 fL — ABNORMAL LOW (ref 78.0–100.0)
MYELOCYTES: 0 %
Metamyelocytes Relative: 0 %
Monocytes Absolute: 1.1 10*3/uL — ABNORMAL HIGH (ref 0.1–1.0)
Monocytes Relative: 11 %
NEUTROS PCT: 57 %
Neutro Abs: 5.6 10*3/uL (ref 1.7–7.7)
OTHER: 0 %
PLATELETS: 40 10*3/uL — AB (ref 150–400)
PROMYELOCYTES ABS: 0 %
RBC: 2.2 MIL/uL — ABNORMAL LOW (ref 3.87–5.11)
RDW: 22.7 % — ABNORMAL HIGH (ref 11.5–15.5)
WBC: 9.9 10*3/uL (ref 4.0–10.5)
nRBC: 0 /100 WBC

## 2015-11-21 LAB — RETICULOCYTES
RBC.: 2.26 MIL/uL — AB (ref 3.87–5.11)
RETIC CT PCT: 4.8 % — AB (ref 0.4–3.1)
Retic Count, Absolute: 108.5 10*3/uL (ref 19.0–186.0)

## 2015-11-21 LAB — GLUCOSE, CAPILLARY
GLUCOSE-CAPILLARY: 119 mg/dL — AB (ref 65–99)
Glucose-Capillary: 125 mg/dL — ABNORMAL HIGH (ref 65–99)
Glucose-Capillary: 203 mg/dL — ABNORMAL HIGH (ref 65–99)
Glucose-Capillary: 193 mg/dL — ABNORMAL HIGH (ref 65–99)

## 2015-11-21 LAB — LACTATE DEHYDROGENASE: LDH: 270 U/L — AB (ref 98–192)

## 2015-11-21 LAB — MRSA PCR SCREENING: MRSA by PCR: NEGATIVE

## 2015-11-21 LAB — MAGNESIUM: Magnesium: 1.8 mg/dL (ref 1.7–2.4)

## 2015-11-21 MED ORDER — HYDROMORPHONE HCL 1 MG/ML IJ SOLN
1.0000 mg | Freq: Once | INTRAMUSCULAR | Status: AC
  Administered 2015-11-21: 1 mg via INTRAVENOUS
  Filled 2015-11-21: qty 1

## 2015-11-21 MED ORDER — MOMETASONE FURO-FORMOTEROL FUM 200-5 MCG/ACT IN AERO
2.0000 | INHALATION_SPRAY | Freq: Two times a day (BID) | RESPIRATORY_TRACT | Status: DC
  Administered 2015-11-21 – 2015-11-26 (×10): 2 via RESPIRATORY_TRACT
  Filled 2015-11-21: qty 8.8

## 2015-11-21 MED ORDER — ALBUTEROL SULFATE HFA 108 (90 BASE) MCG/ACT IN AERS: 2.0000 | INHALATION_SPRAY | Freq: Four times a day (QID) | RESPIRATORY_TRACT | Status: DC | PRN

## 2015-11-21 MED ORDER — SERTRALINE HCL 100 MG PO TABS
100.0000 mg | ORAL_TABLET | Freq: Every day | ORAL | Status: DC
  Administered 2015-11-21 – 2015-11-26 (×6): 100 mg via ORAL
  Filled 2015-11-21 (×6): qty 1

## 2015-11-21 MED ORDER — HYDROMORPHONE HCL 1 MG/ML IJ SOLN
1.0000 mg | INTRAMUSCULAR | Status: DC | PRN
  Administered 2015-11-21 – 2015-11-22 (×6): 1 mg via INTRAVENOUS
  Filled 2015-11-21 (×6): qty 1

## 2015-11-21 MED ORDER — ACETAMINOPHEN 650 MG RE SUPP: 650.0000 mg | RECTAL | Status: DC | PRN

## 2015-11-21 MED ORDER — SODIUM CHLORIDE 0.9 % IV SOLN: Freq: Once | INTRAVENOUS | Status: AC

## 2015-11-21 MED ORDER — LORATADINE 10 MG PO TABS
10.0000 mg | ORAL_TABLET | Freq: Every day | ORAL | Status: DC
  Administered 2015-11-21 – 2015-11-26 (×6): 10 mg via ORAL
  Filled 2015-11-21 (×6): qty 1

## 2015-11-21 MED ORDER — DIPHENHYDRAMINE HCL 25 MG PO CAPS
25.0000 mg | ORAL_CAPSULE | ORAL | Status: DC | PRN
  Administered 2015-11-23: 25 mg via ORAL
  Filled 2015-11-21: qty 1

## 2015-11-21 MED ORDER — ACETAMINOPHEN 325 MG PO TABS
650.0000 mg | ORAL_TABLET | ORAL | Status: DC | PRN
  Administered 2015-11-23: 650 mg via ORAL
  Filled 2015-11-21: qty 2

## 2015-11-21 MED ORDER — PANTOPRAZOLE SODIUM 40 MG PO TBEC
40.0000 mg | DELAYED_RELEASE_TABLET | Freq: Every day | ORAL | Status: DC
  Administered 2015-11-21 – 2015-11-26 (×6): 40 mg via ORAL
  Filled 2015-11-21 (×6): qty 1

## 2015-11-21 MED ORDER — MONTELUKAST SODIUM 10 MG PO TABS
10.0000 mg | ORAL_TABLET | Freq: Every morning | ORAL | Status: DC
  Administered 2015-11-21 – 2015-11-26 (×6): 10 mg via ORAL
  Filled 2015-11-21 (×6): qty 1

## 2015-11-21 MED ORDER — FOLIC ACID 1 MG PO TABS
2.0000 mg | ORAL_TABLET | Freq: Every day | ORAL | Status: DC
Start: 2015-11-21 — End: 2015-11-26
  Administered 2015-11-21 – 2015-11-26 (×6): 2 mg via ORAL
  Filled 2015-11-21 (×6): qty 2

## 2015-11-21 MED ORDER — ONDANSETRON HCL 4 MG/2ML IJ SOLN
4.0000 mg | INTRAMUSCULAR | Status: DC | PRN
Start: 2015-11-21 — End: 2015-11-26

## 2015-11-21 MED ORDER — INSULIN ASPART 100 UNIT/ML ~~LOC~~ SOLN
0.0000 [IU] | Freq: Three times a day (TID) | SUBCUTANEOUS | Status: DC
  Administered 2015-11-21: 5 [IU] via SUBCUTANEOUS
  Administered 2015-11-21: 2 [IU] via SUBCUTANEOUS
  Administered 2015-11-22 – 2015-11-23 (×4): 3 [IU] via SUBCUTANEOUS
  Administered 2015-11-23 – 2015-11-24 (×3): 2 [IU] via SUBCUTANEOUS
  Administered 2015-11-24 – 2015-11-25 (×3): 3 [IU] via SUBCUTANEOUS
  Administered 2015-11-25: 2 [IU] via SUBCUTANEOUS
  Administered 2015-11-26: 3 [IU] via SUBCUTANEOUS

## 2015-11-21 MED ORDER — HYDROCODONE-ACETAMINOPHEN 5-325 MG PO TABS
1.0000 | ORAL_TABLET | ORAL | Status: DC | PRN
  Administered 2015-11-21: 2 via ORAL
  Filled 2015-11-21: qty 2

## 2015-11-21 MED ORDER — INSULIN GLARGINE 100 UNIT/ML ~~LOC~~ SOLN
20.0000 [IU] | Freq: Two times a day (BID) | SUBCUTANEOUS | Status: DC
  Administered 2015-11-21 – 2015-11-26 (×11): 20 [IU] via SUBCUTANEOUS
  Filled 2015-11-21 (×14): qty 0.2

## 2015-11-21 MED ORDER — LORAZEPAM 2 MG/ML IJ SOLN: 0.5000 mg | INTRAMUSCULAR | Status: DC | PRN

## 2015-11-21 MED ORDER — DIPHENHYDRAMINE HCL 50 MG/ML IJ SOLN
25.0000 mg | INTRAMUSCULAR | Status: DC | PRN
  Filled 2015-11-21: qty 0.5

## 2015-11-21 MED ORDER — SODIUM CHLORIDE 0.9 % IV SOLN
INTRAVENOUS | Status: AC
  Administered 2015-11-21: 100 mL via INTRAVENOUS

## 2015-11-21 MED ORDER — VITAMIN D (ERGOCALCIFEROL) 1.25 MG (50000 UNIT) PO CAPS
50000.0000 [IU] | ORAL_CAPSULE | ORAL | Status: DC
  Administered 2015-11-23: 50000 [IU] via ORAL
  Filled 2015-11-21: qty 1

## 2015-11-21 MED ORDER — POLYETHYLENE GLYCOL 3350 17 G PO PACK: 17.0000 g | PACK | Freq: Every day | ORAL | Status: DC | PRN

## 2015-11-21 MED ORDER — FOLIC ACID 1 MG PO TABS: 1.0000 mg | ORAL_TABLET | Freq: Every day | ORAL | Status: DC

## 2015-11-21 MED ORDER — LORAZEPAM 0.5 MG PO TABS: 0.5000 mg | ORAL_TABLET | ORAL | Status: DC | PRN

## 2015-11-21 MED ORDER — SENNOSIDES-DOCUSATE SODIUM 8.6-50 MG PO TABS
1.0000 | ORAL_TABLET | Freq: Two times a day (BID) | ORAL | Status: DC
  Administered 2015-11-21 – 2015-11-22 (×4): 1 via ORAL
  Filled 2015-11-21 (×11): qty 1

## 2015-11-21 MED ORDER — ALLOPURINOL 100 MG PO TABS
100.0000 mg | ORAL_TABLET | ORAL | Status: DC
  Administered 2015-11-21 – 2015-11-25 (×3): 100 mg via ORAL
  Filled 2015-11-21 (×4): qty 1

## 2015-11-21 MED ORDER — ONDANSETRON HCL 4 MG PO TABS: 4.0000 mg | ORAL_TABLET | ORAL | Status: DC | PRN

## 2015-11-21 MED ORDER — ALBUTEROL SULFATE (2.5 MG/3ML) 0.083% IN NEBU: 2.5000 mg | INHALATION_SOLUTION | Freq: Four times a day (QID) | RESPIRATORY_TRACT | Status: DC | PRN

## 2015-11-21 MED ORDER — ADULT MULTIVITAMIN W/MINERALS CH
1.0000 | ORAL_TABLET | Freq: Every day | ORAL | Status: DC
  Administered 2015-11-21 – 2015-11-26 (×6): 1 via ORAL
  Filled 2015-11-21 (×6): qty 1

## 2015-11-21 NOTE — H&P (Signed)
[facility] History and Physical  INTA SUSKI [id number] DOB: [birth year] DOA: 11/20/2015  Referring physician: ED physician PCP: Maylon Peppers, MD  Specialists:  Dr. Marin Olp (oncology)   Chief Complaint:  Pain in extremities L > R  HPI: Rachel Palmer is a 75 y.o. female with PMH of sickle cell disease, insulin-dependent diabetes mellitus, hypertension, and chronic kidney disease stage 3-4 who presents to the [facility] ED with painful crisis. Patient complained of approximately one day of 10/10 pain in her extremities consistent with prior pain crises. She reported pain being worse in the left upper and lower extremity, as she explains is typical for her pain crises. She denied any preceding fever, chills, chest pain, or dyspnea. There is no new cough, dysuria, or diarrhea. She denies sick contacts. She tried managing her symptoms at home with increased oral fluid intake and 5 mg PO Dilaudid, but without appreciable improvement. BMP was performed and notable for serum bicarbonate of 15 and serum creatinine of 1.8, up from an apparent baseline of 1.6. CBC is concerning for a hemoglobin of 5.2, down from her typical baseline of 7-8. Leukocytosis to 14,000 is also noted. Patient was treated with IV fluids and IV hydromorphone in the emergency department at [facility] but her intense pain persisted. Hospitalist service at [facility] was consulted for admission and patient was transported via [place].  Rachel Palmer was evaluated in the stepdown unit at [facility] where she is noted to be afebrile, saturating at 100% on 2 L/m, and with vital signs stable. She reports improvement in her pain since her arrival at the outside hospital and continues to deny fevers, chills, cough, dysuria, or diarrhea. She reports that pain is most severe in the left upper and lower extremities, explaining this is typical of her pain crises. She denies melena, hematochezia, or hematuria. Plan for pain control,  cultures, and transfusion of packed red blood cells was discussed. Patient refuses blood transfusion at this time, while acknowledging that her hemoglobin is only 5.2 and life-threateningly low. She is hopeful that her hemoglobin will recover before worsening and understands that her treatment team is making a very strong recommendation for transfusion, fearing that she could suffer complications, including death, by refusing this treatment. Patient's wishes will be honored and she will sign a blood refusal form. She is willing to revisit this after repeat H&H in a couple hours and understands that the blood bank will be asked to prepare 2 units for her. She will be admitted to the stepdown unit for ongoing management of sickle cell disease with pain crises and life-threatening anemia.  Where does patient live?   At home     Can patient participate in ADLs?  Yes        Review of Systems:   General: no fevers, chills, sweats, weight change, poor appetite, or fatigue HEENT: no blurry vision, hearing changes or sore throat Pulm: no dyspnea, cough, or wheeze CV: no chest pain or palpitations Abd: no vomiting, abdominal pain, diarrhea, or constipation. Nausea.  GU: no dysuria, hematuria, increased urinary frequency, or urgency  Ext:  Mild chronic intermittent leg edema b/l. Pain in extremities x4 Neuro: no focal weakness, numbness, or tingling, no vision change or hearing loss Skin: no rash, no wounds MSK: No muscle spasm, no deformity, no red, hot, or swollen joint Heme: No easy bruising or bleeding Travel history: No recent long distant travel    Allergy:  Allergies  Allergen Reactions  . Codeine Hives  .  Heparin [facility]  . Iohexol Hives    Synthetic xray dye=iohexol   . Ivp Dye [Iodinated Diagnostic Agents] Hives  . Keflex [Cephalexin] Hives  . Propoxyphene     Loopy  . Tape Rash    Skin peels    Past Medical History  Diagnosis Date  . Hypertension   . Sickle cell  disease ([place])   . Diabetes mellitus   . Anemia, chronic renal failure 08/04/2011  . Sickle cell disease, type [employer] ([place]) 08/04/2011  . Blood transfusion without reported diagnosis   . Fatty liver   . Other iron deficiency anemias 10/21/2015    Past Surgical History  Procedure Laterality Date  . Cholecystectomy    . Shoulder surgery    . Appendectomy    . Knee surgery      Social History:  reports that she has quit smoking. She does not have any smokeless tobacco history on file. She reports that she does not drink alcohol or use illicit drugs.  Family History:  Family History  Problem Relation Age of Onset  . Stroke Mother      Prior to Admission medications   Medication Sig Start Date End Date Taking? Authorizing Provider  albuterol (PROVENTIL HFA;VENTOLIN HFA) 108 (90 BASE) MCG/ACT inhaler Inhale 2 puffs into the lungs every 6 (six) hours as needed for wheezing or shortness of breath.     Historical Provider, MD  allopurinol (ZYLOPRIM) 100 MG tablet Take 100 mg by mouth every other day.  06/24/15   Historical Provider, MD  cetirizine (ZYRTEC) 10 MG tablet Take 10 mg by mouth daily.     Historical Provider, MD  ergocalciferol (VITAMIN D2) 50000 UNITS capsule Take 50,000 Units by mouth once a week. Tuesday    Historical Provider, MD  Fluticasone-Salmeterol (ADVAIR) 500-50 MCG/DOSE AEPB Inhale 1 puff into the lungs 2 (two) times daily.    Historical Provider, MD  folic acid (FOLVITE) 1 MG tablet Take 2 tablets (2 mg total) by mouth daily. 08/05/15   Modena Jansky, MD  furosemide (LASIX) 20 MG tablet Take 20 mg by mouth as needed for fluid.  06/24/15   Historical Provider, MD  hydrochlorothiazide (HYDRODIURIL) 25 MG tablet  09/04/15   Historical Provider, MD  insulin aspart protamine- aspart (NOVOLOG MIX 70/30) (70-30) 100 UNIT/ML injection Inject 35-38 Units into the skin 2 (two) times daily with a meal. 35 in the morning and 38 in the evening    Historical Provider, MD  Metoprolol  Succinate (TOPROL XL PO) Take 100 mg by mouth daily.     Historical Provider, MD  montelukast (SINGULAIR) 10 MG tablet Take 10 mg by mouth every morning.    Historical Provider, MD  Multiple Vitamin (MULTIVITAMIN WITH MINERALS) TABS tablet Take 1 tablet by mouth daily.    Historical Provider, MD  omeprazole (PRILOSEC) 40 MG capsule Take 40 mg by mouth daily. 07/08/15   Historical Provider, MD  oxycodone (OXY-IR) 5 MG capsule Take 1 capsule (5 mg total) by mouth every 4 (four) hours as needed for pain. Patient taking differently: Take 5 mg by mouth every 6 (six) hours as needed for pain.  08/06/15   Charlynne Cousins, MD  sertraline (ZOLOFT) 100 MG tablet Take 100 mg by mouth daily.    Historical Provider, MD    Physical Exam: Filed Vitals:   11/21/15 0015 11/21/15 0100 11/21/15 0200 11/21/15 0300  BP: 125/44 118/49 159/68 136/51  Pulse: 86 86 88 91  Temp:  TempSrc:      Resp:   15 16  Height:   5\' 5"  (1.651 m)   Weight:   86.9 kg (191 lb 9.3 oz)   SpO2: 100% 100% 100% 100%   General: Not in acute distress HEENT:       Eyes: PERRL, EOMI, no scleral icterus. Conjunctivae pale.       ENT: No discharge from the ears or nose, no pharyngeal ulcers, oral mucosa moist and pink.        Neck: No JVD, no bruit, no appreciable mass Heme: No cervical adenopathy, no pallor Cardiac: S1/S2, RRR, hyperdynamic precordium. No murmurs, No gallops or rubs. Pulm: Good air movement bilaterally. No rales, wheezing, rhonchi or rubs. Abd: Soft, nondistended, nontender, no rebound pain or gaurding, no mass or organomegaly, BS present. Ext: Trace LE edema bilaterally. 2+DP/PT pulse bilaterally. Musculoskeletal: No gross deformity, no red, hot, swollen joints   Skin: No rashes or wounds on exposed surfaces  Neuro: Somnolent, arousable, oriented X3, cranial nerves II-XII grossly intact. No focal findings Psych: Patient is not overtly psychotic, appropriate mood and affect.  Labs on Admission:  Basic  Metabolic Panel:  Recent Labs Lab 11/20/15 2305  NA 140  K 4.9  CL 116*  CO2 15*  GLUCOSE 129*  BUN 24*  CREATININE 1.80*  CALCIUM 8.5*   Liver Function Tests: No results for input(s): AST, ALT, ALKPHOS, BILITOT, PROT, ALBUMIN in the last 168 hours. No results for input(s): LIPASE, AMYLASE in the last 168 hours. No results for input(s): AMMONIA in the last 168 hours. CBC:  Recent Labs Lab 11/20/15 2305  WBC 13.9*  NEUTROABS 9.7*  HGB 5.2*  HCT 15.3*  MCV 70.2*  PLT PLATELET COUNT CONFIRMED BY SMEAR   Cardiac Enzymes: No results for input(s): CKTOTAL, CKMB, CKMBINDEX, TROPONINI in the last 168 hours.  BNP (last 3 results) No results for input(s): BNP in the last 8760 hours.  ProBNP (last 3 results) No results for input(s): PROBNP in the last 8760 hours.  CBG: No results for input(s): GLUCAP in the last 168 hours.  Radiological Exams on Admission: No results found.  EKG:   Not done in ED, will obtain as appropriate   Assessment/Plan  1. Sickle cell pain crisis  - Pain in all extremities, LUE & LLE > right, as she reports is typical of her pain crises  - Uncertain precipitant; she denies fever, chills, or infectious sxs; denies dehydration  - Leukocytosis possibly reactive, but given the significant illness and lower incidence of fever in her age group, will obtain cultures  - Dilaudid IV 1mg  q2h for severe pain, Norco 5 mg 1-2 tabs q4h prn moderate pain, APAP 650 mg q6h prn mild pain; will adjust regimen as needed  - Maintain adequate hydration  - Follow-up cultures  - No CP or dyspnea to suggest acute chest    2. Sickle cell disease with critical anemia  - Hgb 5.2 on arrival to outside hospital, down from apparent baseline of 7-8 - Unfortunately, pt is strongly opposed to receiving transfusion tonight, although that was the reason for her transfer to this hospital  - Pt will sign blood refusal form, agrees that we will have blood bank prepare 2 units and  revisit this after repeat H&H in a couple hours  - No sign of active blood loss  - Denies melena, hematochezia, hematuria, hematemesis, ecchymosis, or petechiae  - Monitor on tele for ischemic changes  - Titrate FiO2 to keep sat high  90s while profoundly anemic   3. CKD stage III-IV - SCr 1.80 on arrival to [facility] ED, up from apparent baseline of ~1.6  - Gentle IV hydration overnight, adjusted to maintain euvolemia  - Holding home Lasix  - Avoid nephrotoxins where possible  - Repeat chem panel in am, pedi tube   4. Insulin-dependent DM  - Uses 35-38 units of 70/30 BID at home with excellent control as demonstrated by recent A1c <6%  - Will use 20 units Lantus BID to start with while inpt, along with moderate-intensity SSI correctional  - Adjust insulins prn according to CBGs TID WC and qHS  - Carb-modified diet when appropriate    5. Leukocytosis  - WBC 13,900 on arrival to [facility]  - Pt afebrile and denying any infectious sxs  - Cultures requested, will follow     DVT ppx:  SCDs  Code Status: Full code Family Communication: None at bed side.               Disposition Plan: Admit to inpatient   Date of Service 11/21/2015    Vianne Bulls, MD [facility] Pager [phone]  If 7PM-7AM, please contact night-coverage [email or web address] Password [facility] 11/21/2015, 3:46 AM

## 2015-11-21 NOTE — Progress Notes (Signed)
Dr. Doreene Burke came and spoke with patient about her hgb. Patient agrees now for blood transfusion. Blood bank called and due to her specific antibodies it may take up to five days per blood bank to find her blood match. Patient and MD notified.

## 2015-11-21 NOTE — Progress Notes (Signed)
Critical Value- Hemoglobin 5.1. Patient currently refusing blood pending speaking to her Hematologist. Dr. Doreene Burke called and reported critical value to. He is coming by to evaluate her.

## 2015-11-22 DIAGNOSIS — N184 Chronic kidney disease, stage 4 (severe): Secondary | ICD-10-CM

## 2015-11-22 DIAGNOSIS — Z794 Long term (current) use of insulin: Secondary | ICD-10-CM

## 2015-11-22 DIAGNOSIS — D5700 Hb-SS disease with crisis, unspecified: Secondary | ICD-10-CM

## 2015-11-22 DIAGNOSIS — D638 Anemia in other chronic diseases classified elsewhere: Secondary | ICD-10-CM

## 2015-11-22 DIAGNOSIS — R000 Tachycardia, unspecified: Secondary | ICD-10-CM

## 2015-11-22 DIAGNOSIS — E119 Type 2 diabetes mellitus without complications: Secondary | ICD-10-CM

## 2015-11-22 LAB — HEMOGLOBIN A1C
Hgb A1c MFr Bld: <4.3 % — ABNORMAL LOW (ref 4.8–5.6)
Mean Plasma Glucose: <77 mg/dL

## 2015-11-22 LAB — GLUCOSE, CAPILLARY
GLUCOSE-CAPILLARY: 172 mg/dL — AB (ref 65–99)
Glucose-Capillary: 179 mg/dL — ABNORMAL HIGH (ref 65–99)
Glucose-Capillary: 169 mg/dL — ABNORMAL HIGH (ref 65–99)
Glucose-Capillary: 207 mg/dL — ABNORMAL HIGH (ref 65–99)

## 2015-11-22 LAB — PREPARE RBC (CROSSMATCH)

## 2015-11-22 MED ORDER — HYDROMORPHONE 1 MG/ML IV SOLN
INTRAVENOUS | Status: DC
  Administered 2015-11-22: 16:00:00 via INTRAVENOUS
  Administered 2015-11-23: 1 mL via INTRAVENOUS
  Administered 2015-11-24 (×2): 1 mg via INTRAVENOUS
  Administered 2015-11-24: 0.5 mg via INTRAVENOUS
  Filled 2015-11-22: qty 25

## 2015-11-22 MED ORDER — METOPROLOL SUCCINATE ER 25 MG PO TB24: 150.0000 mg | ORAL_TABLET | Freq: Every day | ORAL | Status: DC

## 2015-11-22 MED ORDER — NALOXONE HCL 0.4 MG/ML IJ SOLN: 0.4000 mg | INTRAMUSCULAR | Status: DC | PRN

## 2015-11-22 MED ORDER — CETYLPYRIDINIUM CHLORIDE 0.05 % MT LIQD
7.0000 mL | Freq: Two times a day (BID) | OROMUCOSAL | Status: DC
  Administered 2015-11-23 – 2015-11-26 (×6): 7 mL via OROMUCOSAL

## 2015-11-22 MED ORDER — METOPROLOL SUCCINATE ER 50 MG PO TB24
150.0000 mg | ORAL_TABLET | Freq: Every day | ORAL | Status: DC
  Administered 2015-11-23 – 2015-11-26 (×4): 150 mg via ORAL
  Filled 2015-11-22: qty 6
  Filled 2015-11-22 (×3): qty 1

## 2015-11-22 MED ORDER — SODIUM CHLORIDE 0.9% FLUSH: 9.0000 mL | INTRAVENOUS | Status: DC | PRN

## 2015-11-22 MED ORDER — METOPROLOL TARTRATE 25 MG PO TABS
75.0000 mg | ORAL_TABLET | Freq: Once | ORAL | Status: AC
  Administered 2015-11-22: 75 mg via ORAL
  Filled 2015-11-22: qty 3

## 2015-11-22 NOTE — Care Management Note (Signed)
Case Management Note  Patient Details  Name: Rachel Palmer MRN: [id number] Date of Birth: [birth year]  Subjective/Objective:         ssc hgb-5.1 bld difficult to transfuse due to antibodies present, iv fld support           Action/Plan: [id number]/Elison Worrel Rosana Hoes, BSN, [place], CCM: [phone] Case management. Chart reviewed for discharge planning and present needs. Discharge needs: none present at time of review.  Expected Discharge Date:                  Expected Discharge Plan:  Home/Self Care  In-House Referral:  NA  Discharge planning Services  CM Consult  Post Acute Care Choice:  NA Choice offered to:  NA  DME Arranged:    DME Agency:     HH Arranged:    HH Agency:     Status of Service:  In process, will continue to follow  Medicare Important Message Given:    Date Medicare IM Given:    Medicare IM give by:    Date Additional Medicare IM Given:    Additional Medicare Important Message give by:     If discussed at [place] of Stay Meetings, dates discussed:    Additional Comments:  Leeroy Cha, RN 11/22/2015, 12:15 PM

## 2015-11-22 NOTE — Progress Notes (Signed)
SICKLE CELL SERVICE PROGRESS NOTE  DIAJA ON [id number] DOB: 30-May-1941 DOA: 11/20/2015 PCP: Maylon Peppers, MD  Assessment/Plan: Principal Problem:   Sickle cell pain crisis ([place]) Active Problems:   Sickle cell disease, type [place] (HCC)   Chronic kidney disease (CKD), stage IV (severe) (HCC)   Anemia   Insulin dependent diabetes mellitus (HCC)   Sickle cell anemia with pain ([place])  1. Sinus Tachycardia: Pt reports a history of tachyarrhythmia and is usually on Toprol XL 150 mg daily. Will give a dose of 75 mg of Metoprolol IR today and start Toprol XL tomorrow.  2. Hb [place] with crisis: Pain poorly controlled on current regimen. Reports pain at 7-8/10 compared to 3-4/10 on a usual basis. She is opiate naive. Will start on  Custom dosed weight based PCA. Continue clinician assisted doses on a PRN basis.  3. CKD III: Cr currently at baseline. Will continue to monitor.  4. H/O HTN: Pt normally has HTN. She currently has a marginally low BP. This is likely contributed to by tachycardia and ineffective EF. Will hold diuretics and Ramapril and monitor BP.  5. Anemia of chronic disease: Pt normally receives Epogen on a regular basis. Will discuss with Dr. Marin Olp who manages her anemia on a  Chronic basis. Will check Hb and reticulocytes tomorrow. Pt had a recent bone marrow Bx in the last 6 months with normal cytogenetics.  6. DM II: Pt normally uses Novolin 70/30 at home. Currently she is on Lantus 20 units BID and Moderate SSI. BS controlled.  7. Left shoulder pain and tenderness: Likely related to sickle cell crisis. However the tenderness is int the distribution of the deltoid bursa thus cannot dismiss the possibility of bursitis. Will reassess after new pain regimen instituted.   Code Status: Full Code Family Communication: N/A Disposition Plan: Not yet ready for discharge  [place].  Pager [phone]. If 7PM-7AM, please contact night-coverage.  11/22/2015, 2:54 PM  LOS: 1 day    Interim History: Pt reports pain in legs and left shoulder at 8/10. She reports that pain is throbbing in LE's but the pain in the left shoulder is more lie a sharp pain accompanied by tenderness.  Consultants:  None  Procedures:  None  Antibiotics:  None   Objective: Filed Vitals:   11/22/15 0600 11/22/15 0800 11/22/15 1000 11/22/15 1200  BP: 139/54 129/80 133/56   Pulse: 100 101 110   Temp:  97.8 F (36.6 C)  98.1 F (36.7 C)  TempSrc:  Oral  Oral  Resp: 16 17 24    Height:      Weight:      SpO2: 100% 100% 100%    Weight change:   Intake/Output Summary (Last 24 hours) at 11/22/15 1454 Last data filed at 11/22/15 0934  Gross per 24 hour  Intake    240 ml  Output   1300 ml  Net  -1060 ml    General: Alert, awake, oriented x3, in no acute distress.  HEENT: /AT PEERL, EOMI, anicteric Neck: Trachea midline,  no masses, no thyromegal,y no JVD, no carotid bruit OROPHARYNX:  Moist, No exudate/ erythema/lesions.  Heart: Regular rate and rhythm, without murmurs, rubs, gallops, PMI non-displaced, no heaves or thrills on palpation.  Lungs: Clear to auscultation, no wheezing or rhonchi noted. No increased vocal fremitus resonant to percussion  Abdomen: Soft, nontender, nondistended, positive bowel sounds, no masses no hepatosplenomegaly noted.  Neuro: No focal neurological deficits noted cranial nerves II through XII grossly intact.  Strength at functional baseline in bilateral upper and lower extremities. Musculoskeletal: No warmth swelling or erythema around joints, Left shoulder tenderness noted over left middle and anterior deltoid. Psychiatric: Patient alert and oriented x3, good insight and cognition, good recent to remote recall.    Data Reviewed: Basic Metabolic Panel:  Recent Labs Lab 11/20/15 2305 11/21/15 0420 11/21/15 0637  NA 140  --  139  K 4.9  --  4.5  CL 116*  --  117*  CO2 15*  --  15*  GLUCOSE 129*  --  125*  BUN 24*  --  23*   CREATININE 1.80*  --  1.85*  CALCIUM 8.5*  --  8.1*  MG  --  1.8  --    Liver Function Tests:  Recent Labs Lab 11/21/15 0637  AST 24  ALT 12*  ALKPHOS 84  BILITOT 1.2  PROT 6.5  ALBUMIN 3.7   No results for input(s): LIPASE, AMYLASE in the last 168 hours. No results for input(s): AMMONIA in the last 168 hours. CBC:  Recent Labs Lab 11/20/15 2305 11/21/15 0637  WBC 13.9* 9.9  NEUTROABS 9.7* 5.6  HGB 5.2* 5.1*  HCT 15.3* 15.4*  MCV 70.2* 70.0*  PLT PLATELET COUNT CONFIRMED BY SMEAR 40*   Cardiac Enzymes: No results for input(s): CKTOTAL, CKMB, CKMBINDEX, TROPONINI in the last 168 hours. BNP (last 3 results) No results for input(s): BNP in the last 8760 hours.  ProBNP (last 3 results) No results for input(s): PROBNP in the last 8760 hours.  CBG:  Recent Labs Lab 11/21/15 1249 11/21/15 1614 11/21/15 2125 11/22/15 0817 11/22/15 1129  GLUCAP 203* 119* 193* 172* 179*    Recent Results (from the past 240 hour(s))  MRSA PCR Screening     Status: None   Collection Time: 11/21/15  1:52 AM  Result Value Ref Range Status   MRSA by PCR NEGATIVE NEGATIVE Final    Comment:        The GeneXpert MRSA Assay (FDA approved for NASAL specimens only), is one component of a comprehensive MRSA colonization surveillance program. It is not intended to diagnose MRSA infection nor to guide or monitor treatment for MRSA infections.   Culture, blood (routine x 2)     Status: None (Preliminary result)   Collection Time: 11/21/15  3:39 AM  Result Value Ref Range Status   Specimen Description BLOOD LEFT ARM  Final   Special Requests IN PEDIATRIC BOTTLE [place]  Final   Culture   Final    NO GROWTH 1 DAY Performed at [facility]    Report Status PENDING  Incomplete     Studies: No results found.  Scheduled Meds: . allopurinol  100 mg Oral QODAY  . folic acid  2 mg Oral Daily  . HYDROmorphone   Intravenous 6 times per day  . insulin aspart  0-15 Units  Subcutaneous TID WC  . insulin glargine  20 Units Subcutaneous BID  . loratadine  10 mg Oral Daily  . metoprolol succinate  150 mg Oral Daily  . metoprolol tartrate  75 mg Oral Once  . mometasone-formoterol  2 puff Inhalation BID  . montelukast  10 mg Oral q morning - 10a  . multivitamin with minerals  1 tablet Oral Daily  . pantoprazole  40 mg Oral Daily  . senna-docusate  1 tablet Oral BID  . sertraline  100 mg Oral Daily  . [START ON 11/23/2015] Vitamin D (Ergocalciferol)  50,000 Units Oral Weekly  Continuous Infusions:   Time spent 50 minutes.

## 2015-11-23 DIAGNOSIS — D696 Thrombocytopenia, unspecified: Secondary | ICD-10-CM

## 2015-11-23 LAB — CBC WITH DIFFERENTIAL/PLATELET
Basophils Absolute: 0 10*3/uL (ref 0.0–0.1)
Basophils Relative: 0 %
EOS PCT: 2 %
Eosinophils Absolute: 0.2 10*3/uL (ref 0.0–0.7)
HEMATOCRIT: 12.7 % — AB (ref 36.0–46.0)
HEMOGLOBIN: 4.1 g/dL — AB (ref 12.0–15.0)
LYMPHS ABS: 2.6 10*3/uL (ref 0.7–4.0)
Lymphocytes Relative: 29 %
MCH: 22.9 pg — AB (ref 26.0–34.0)
MCHC: 32.3 g/dL (ref 30.0–36.0)
MCV: 70.9 fL — AB (ref 78.0–100.0)
Monocytes Absolute: 0.9 10*3/uL (ref 0.1–1.0)
Monocytes Relative: 10 %
NEUTROS ABS: 5.3 10*3/uL (ref 1.7–7.7)
NEUTROS PCT: 59 %
NRBC: 9 /100{WBCs} — AB
Platelets: 38 10*3/uL — ABNORMAL LOW (ref 150–400)
RBC: 1.79 MIL/uL — AB (ref 3.87–5.11)
RDW: 23.3 % — AB (ref 11.5–15.5)
WBC: 9 10*3/uL (ref 4.0–10.5)

## 2015-11-23 LAB — BASIC METABOLIC PANEL
Anion gap: 7 (ref 5–15)
BUN: 26 mg/dL — AB (ref 6–20)
CALCIUM: 8.2 mg/dL — AB (ref 8.9–10.3)
CHLORIDE: 117 mmol/L — AB (ref 101–111)
CO2: 18 mmol/L — ABNORMAL LOW (ref 22–32)
CREATININE: 1.96 mg/dL — AB (ref 0.44–1.00)
GFR, EST AFRICAN AMERICAN: 28 mL/min — AB (ref >60)
GFR, EST NON AFRICAN AMERICAN: 24 mL/min — AB (ref >60)
Glucose, Bld: 160 mg/dL — ABNORMAL HIGH (ref 65–99)
Potassium: 4.5 mmol/L (ref 3.5–5.1)
SODIUM: 142 mmol/L (ref 135–145)

## 2015-11-23 LAB — RETICULOCYTES
RBC.: 1.79 MIL/uL — AB (ref 3.87–5.11)
RETIC CT PCT: 6.8 % — AB (ref 0.4–3.1)
Retic Count, Absolute: 121.7 10*3/uL (ref 19.0–186.0)

## 2015-11-23 LAB — URINE CULTURE
Culture: NO GROWTH
Special Requests: NORMAL

## 2015-11-23 LAB — GLUCOSE, CAPILLARY
GLUCOSE-CAPILLARY: 136 mg/dL — AB (ref 65–99)
GLUCOSE-CAPILLARY: 353 mg/dL — AB (ref 65–99)
Glucose-Capillary: 152 mg/dL — ABNORMAL HIGH (ref 65–99)
Glucose-Capillary: 198 mg/dL — ABNORMAL HIGH (ref 65–99)
Glucose-Capillary: 303 mg/dL — ABNORMAL HIGH (ref 65–99)

## 2015-11-23 MED ORDER — SODIUM CHLORIDE 0.9 % IV SOLN
Freq: Once | INTRAVENOUS | Status: AC
  Administered 2015-11-23: 15:00:00 via INTRAVENOUS

## 2015-11-23 MED ORDER — POLYVINYL ALCOHOL 1.4 % OP SOLN
1.0000 [drp] | OPHTHALMIC | Status: DC | PRN
  Administered 2015-11-24: 1 [drp] via OPHTHALMIC
  Filled 2015-11-23: qty 15

## 2015-11-23 MED ORDER — METHYLPREDNISOLONE SODIUM SUCC 125 MG IJ SOLR
60.0000 mg | Freq: Once | INTRAMUSCULAR | Status: AC
  Administered 2015-11-23: 60 mg via INTRAVENOUS
  Filled 2015-11-23: qty 2

## 2015-11-23 MED ORDER — INSULIN ASPART 100 UNIT/ML ~~LOC~~ SOLN
4.0000 [IU] | Freq: Once | SUBCUTANEOUS | Status: AC
  Administered 2015-11-23: 4 [IU] via SUBCUTANEOUS

## 2015-11-23 NOTE — Progress Notes (Signed)
Received notification from blood bank that blood has arrived. Pt has warm antibodies so will premedicate with IV solumedrol 60 mg , Benadryl 25 mg and Tylenol 650 mg.  Consent given to blood bank to release blood and transfuse 2 units RBC's.

## 2015-11-23 NOTE — Progress Notes (Signed)
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 Anemia of Chronic Disease: Pt shows no evidence of active bleeding. Her Hb today is 4.1 which is likely in part dilutional and also resultat from poor bone ,arrow response. I would be prudent to obtain iron studies however in light of her low Hb I would no pursue further phlebotomy. She has no signs of viral illness so will not pursue Parvo-B. I have spoken with Dr. Marin Olp and will order Aranesp in place of Procrit. Pt had a recent bone marrow Bx in the last 6 months with normal cytogenetics.  2. Thrombocytopenia: There is no evidence of bleeding but platelets are exceptionally low.  Will ask Dr. Marin Olp to see in consult. Continue to monitor for bleeding. 3. Sinus Tachycardia: Improved since Metoprolol resumed. 4. Hb [place] with crisis: Pt reports that pain markedly improved today. She rates pain at 5-6/10 compared to 3-4/10 on a usual basis. She is opiate naive. Will continue custom dosed weight based PCA. Continue clinician assisted doses on a PRN basis. No NSAID's due to CKD. 5. CKD III: Cr currently at baseline. Will continue to monitor.  6. H/O HTN: Pt normally has HTN bur currently in normal ranges. BP has improved since HR normalized. Continue to  hold diuretics and Ramapril and monitor BP.  7.  DM II: Pt normally uses Novolin 70/30 at home. Currently she is on Lantus 20 units BID and Moderate SSI. BS controlled. Continue. 8. Left shoulder pain and tenderness: Likely related to sickle cell crisis. Improved with use of PCA.  Code Status: Full Code Family Communication: N/A Disposition Plan: Not yet ready  for discharge  [place].  Pager [phone]. If 7PM-7AM, please contact night-coverage.  11/23/2015, 10:09 AM  LOS: 2 days   Interim History: Pt  Consultants:  Phone Consult-Dr. Marin Olp.  Procedures:  None  Antibiotics:  None   Objective: Filed Vitals:   11/23/15 0715 11/23/15 0744 11/23/15 0754 11/23/15 0800  BP:    126/67  Pulse:    87  Temp: 98.1 F (36.7 C)     TempSrc: Oral     Resp:  14 17 22   Height:      Weight:      SpO2:  100% 100% 100%   Weight change:   Intake/Output Summary (Last 24 hours) at 11/23/15 1009 Last data filed at 11/23/15 0800  Gross per 24 hour  Intake      1 ml  Output   1100 ml  Net  -1099 ml    General: Alert, awake, oriented x3, appears clinically improved today.  HEENT: /AT PEERL, EOMI, anicteric Neck: Trachea midline,  no masses, no thyromegal,y no JVD, no carotid bruit OROPHARYNX:  Moist, No exudate/ erythema/lesions.  Heart: Regular rate and rhythm, without murmurs, rubs, gallops, PMI non-displaced, no heaves or thrills on palpation.  Lungs: Clear to auscultation, no wheezing or rhonchi noted. No increased vocal fremitus resonant to percussion. Abdomen: Soft, nontender, nondistended, positive bowel sounds, no masses no hepatosplenomegaly noted.  Neuro: No focal neurological deficits noted cranial nerves II through XII grossly intact.  Strength at functional baseline in bilateral upper and lower  extremities. Musculoskeletal: No warmth swelling or erythema around joints, Left shoulder tenderness improved over left middle and anterior deltoid. Psychiatric: Patient alert and oriented x3, good insight and cognition, good recent to remote recall.    Data Reviewed: Basic Metabolic Panel:  Recent Labs Lab 11/20/15 2305 11/21/15 0420 11/21/15 0637 11/23/15 0302  NA 140  --  139 142  K 4.9  --  4.5 4.5  CL 116*  --  117* 117*  CO2 15*  --  15* 18*  GLUCOSE 129*  --  125* 160*  BUN 24*  --  23* 26*  CREATININE  1.80*  --  1.85* 1.96*  CALCIUM 8.5*  --  8.1* 8.2*  MG  --  1.8  --   --    Liver Function Tests:  Recent Labs Lab 11/21/15 0637  AST 24  ALT 12*  ALKPHOS 84  BILITOT 1.2  PROT 6.5  ALBUMIN 3.7   No results for input(s): LIPASE, AMYLASE in the last 168 hours. No results for input(s): AMMONIA in the last 168 hours. CBC:  Recent Labs Lab 11/20/15 2305 11/21/15 0637 11/23/15 0302  WBC 13.9* 9.9 9.0  NEUTROABS 9.7* 5.6 5.3  HGB 5.2* 5.1* 4.1*  HCT 15.3* 15.4* 12.7*  MCV 70.2* 70.0* 70.9*  PLT PLATELET COUNT CONFIRMED BY SMEAR 40* 38*   Cardiac Enzymes: No results for input(s): CKTOTAL, CKMB, CKMBINDEX, TROPONINI in the last 168 hours. BNP (last 3 results) No results for input(s): BNP in the last 8760 hours.  ProBNP (last 3 results) No results for input(s): PROBNP in the last 8760 hours.  CBG:  Recent Labs Lab 11/22/15 0817 11/22/15 1129 11/22/15 1701 11/22/15 2156 11/23/15 0834  GLUCAP 172* 179* 169* 207* 152*    Recent Results (from the past 240 hour(s))  MRSA PCR Screening     Status: None   Collection Time: 11/21/15  1:52 AM  Result Value Ref Range Status   MRSA by PCR NEGATIVE NEGATIVE Final    Comment:        The GeneXpert MRSA Assay (FDA approved for NASAL specimens only), is one component of a comprehensive MRSA colonization surveillance program. It is not intended to diagnose MRSA infection nor to guide or monitor treatment for MRSA infections.   Culture, blood (routine x 2)     Status: None (Preliminary result)   Collection Time: 11/21/15  3:39 AM  Result Value Ref Range Status   Specimen Description BLOOD LEFT ARM  Final   Special Requests IN PEDIATRIC BOTTLE [place]  Final   Culture   Final    NO GROWTH 1 DAY Performed at [facility]    Report Status PENDING  Incomplete  Culture, blood (routine x 2)     Status: None (Preliminary result)   Collection Time: 11/21/15  4:20 AM  Result Value Ref Range Status   Specimen  Description BLOOD RIGHT ARM  Final   Special Requests BOTTLES DRAWN AEROBIC ONLY [place]  Final   Culture   Final    NO GROWTH 1 DAY Performed at [facility]    Report Status PENDING  Incomplete     Studies: No results found.  Scheduled Meds: . allopurinol  100 mg Oral QODAY  . antiseptic oral rinse  7 mL Mouth Rinse BID  . folic acid  2 mg Oral Daily  . HYDROmorphone   Intravenous 6 times per day  . insulin aspart  0-15 Units Subcutaneous TID WC  . insulin glargine  20  Units Subcutaneous BID  . loratadine  10 mg Oral Daily  . metoprolol succinate  150 mg Oral Daily  . mometasone-formoterol  2 puff Inhalation BID  . montelukast  10 mg Oral q morning - 10a  . multivitamin with minerals  1 tablet Oral Daily  . pantoprazole  40 mg Oral Daily  . senna-docusate  1 tablet Oral BID  . sertraline  100 mg Oral Daily  . Vitamin D (Ergocalciferol)  50,000 Units Oral Weekly   Continuous Infusions:   Time spent 50 minutes.

## 2015-11-24 ENCOUNTER — Ambulatory Visit: Payer: Medicare Other | Admitting: Hematology & Oncology

## 2015-11-24 ENCOUNTER — Ambulatory Visit: Payer: Medicare Other

## 2015-11-24 ENCOUNTER — Other Ambulatory Visit: Payer: Medicare Other

## 2015-11-24 DIAGNOSIS — D649 Anemia, unspecified: Secondary | ICD-10-CM

## 2015-11-24 DIAGNOSIS — N19 Unspecified kidney failure: Secondary | ICD-10-CM

## 2015-11-24 DIAGNOSIS — N189 Chronic kidney disease, unspecified: Secondary | ICD-10-CM

## 2015-11-24 DIAGNOSIS — D509 Iron deficiency anemia, unspecified: Secondary | ICD-10-CM

## 2015-11-24 LAB — TYPE AND SCREEN
ABO/RH(D): O POS
Antibody Screen: POSITIVE
DAT, IgG: POSITIVE
UNIT DIVISION: 0
UNIT DIVISION: 0

## 2015-11-24 LAB — CBC WITH DIFFERENTIAL/PLATELET
BASOS ABS: 0.1 10*3/uL (ref 0.0–0.1)
Basophils Relative: 1 %
EOS PCT: 0 %
Eosinophils Absolute: 0 10*3/uL (ref 0.0–0.7)
HEMATOCRIT: 18.5 % — AB (ref 36.0–46.0)
Hemoglobin: 6.1 g/dL — CL (ref 12.0–15.0)
LYMPHS ABS: 2.6 10*3/uL (ref 0.7–4.0)
Lymphocytes Relative: 26 %
MCH: 24.4 pg — ABNORMAL LOW (ref 26.0–34.0)
MCHC: 33 g/dL (ref 30.0–36.0)
MCV: 74 fL — AB (ref 78.0–100.0)
MONO ABS: 1 10*3/uL (ref 0.1–1.0)
MONOS PCT: 10 %
NEUTROS ABS: 6.2 10*3/uL (ref 1.7–7.7)
Neutrophils Relative %: 63 %
Platelets: 53 10*3/uL — ABNORMAL LOW (ref 150–400)
RBC: 2.5 MIL/uL — ABNORMAL LOW (ref 3.87–5.11)
RDW: 22.1 % — AB (ref 11.5–15.5)
WBC: 9.9 10*3/uL (ref 4.0–10.5)

## 2015-11-24 LAB — GLUCOSE, CAPILLARY
GLUCOSE-CAPILLARY: 172 mg/dL — AB (ref 65–99)
GLUCOSE-CAPILLARY: 147 mg/dL — AB (ref 65–99)
Glucose-Capillary: 226 mg/dL — ABNORMAL HIGH (ref 65–99)
Glucose-Capillary: 170 mg/dL — ABNORMAL HIGH (ref 65–99)
Glucose-Capillary: 174 mg/dL — ABNORMAL HIGH (ref 65–99)

## 2015-11-24 LAB — RETICULOCYTES
RBC.: 2.5 MIL/uL — ABNORMAL LOW (ref 3.87–5.11)
Retic Count, Absolute: 167.5 10*3/uL (ref 19.0–186.0)
Retic Ct Pct: 6.7 % — ABNORMAL HIGH (ref 0.4–3.1)

## 2015-11-24 MED ORDER — DARBEPOETIN ALFA 60 MCG/0.3ML IJ SOSY
60.0000 ug | PREFILLED_SYRINGE | INTRAMUSCULAR | Status: DC
  Administered 2015-11-24: 60 ug via SUBCUTANEOUS
  Filled 2015-11-24: qty 0.3

## 2015-11-24 MED ORDER — HYDROCHLOROTHIAZIDE 25 MG PO TABS
25.0000 mg | ORAL_TABLET | Freq: Every day | ORAL | Status: DC
  Administered 2015-11-24 – 2015-11-26 (×3): 25 mg via ORAL
  Filled 2015-11-24 (×3): qty 1

## 2015-11-24 MED ORDER — HYDROMORPHONE HCL 1 MG/ML IJ SOLN: 1.0000 mg | INTRAMUSCULAR | Status: DC | PRN

## 2015-11-24 MED ORDER — OXYCODONE HCL 5 MG PO TABS: 5.0000 mg | ORAL_TABLET | ORAL | Status: DC | PRN

## 2015-11-24 NOTE — Progress Notes (Signed)
MEDICATION RELATED CONSULT NOTE - INITIAL   Pharmacy Consult for Aranesp Indication: Anemia (CKD + SCD)  Allergies  Allergen Reactions  . Keflex [Cephalexin] Hives  . Codeine Hives  . Heparin Other (See Comments)    Other Reaction: thrombocytopenia  . Heparin [facility]  . Iohexol Hives    Synthetic xray dye=iohexol   . Ioxaglate Hives  . Ivp Dye [Iodinated Diagnostic Agents] Hives  . Propoxyphene Other (See Comments)    Loopy  . Tape Rash    Skin peels Skin peels    Patient Measurements: Height: 5\' 5"  (165.1 cm) Weight: 194 lb 10.7 oz (88.3 kg) IBW/kg (Calculated) : 57  Vital Signs: BP: 127/55 mmHg (03/08 1048) Pulse Rate: 73 (03/08 1044) Intake/Output from previous day: 03/07 0701 - 03/08 0700 In: 741 [I.V.:1; Blood:740] Out: -  Intake/Output from this shift:    Labs:  Recent Labs  11/23/15 0302 11/24/15 0848  WBC 9.0 9.9  HGB 4.1* 6.1*  HCT 12.7* 18.5*  PLT 38* 53*  CREATININE 1.96*  --    Estimated Creatinine Clearance: 28.5 mL/min (by C-G formula based on Cr of 1.96).  Medical History: Past Medical History  Diagnosis Date  . Hypertension   . Sickle cell disease ([place])   . Diabetes mellitus   . Anemia, chronic renal failure 08/04/2011  . Sickle cell disease, type [place] ([place]) 08/04/2011  . Blood transfusion without reported diagnosis   . Fatty liver   . Other iron deficiency anemias 10/21/2015    Assessment: 67 yoF admitted for [facility] crisis.  PMH CKD, splenomegaly, thrombocytopenia (chronic).  Currently receiving Epoetin at [place]; 40k units every 2-4 weeks depending on severity of anemia.  Pharmacy consulted to dose Aranesp while admitted.  Today, 11/24/2015:  Hgb 6.1, up from 4.1 yesterday following 2 units PRBC  Most recent EPO dose on 10/21/15, with instructions to return in 3 wks  Goal of Therapy:  Prevent need for blood transfusions (patient does have multiple antibodies to blood products)  Plan:  Aranesp 60 mg SQ weekly.   F/u Hgb; hold if > 11 g/L  Reuel Boom, PharmD, BCPS Pager: [phone] 11/24/2015, 12:59 PM

## 2015-11-24 NOTE — Progress Notes (Addendum)
Ambulated patient in hallway heart rate remained between  94 - 97 bpm.  Telemetry will be discontinued  per Dr. Zigmund Daniel.

## 2015-11-24 NOTE — Consult Note (Signed)
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 Chronic splenomegaly. Chronic thrombocytopenia.   Chief Complaint  Patient presents with  . Sickle Cell Pain Crisis  : I have been under a lot of stress and my sickle cell acted up.  HPI: Ms. Gailes is well-known to me. She is very charming 75 year old African-American female. She has hemoglobin [place] disease. She does have chronic renal insufficiency. She has had some problems with blood sugars. She has chronic splenomegaly. She has chronic thrombocytopenia. We are to did a bone marrow test on her back in November. The bone marrow test was pretty much unremarkable. She had erythroid hyperplasia. She has no evidence of any malignancy. She just may have chronic splenomegaly.  She has been getting Aranesp in the office.  She was admitted over the weekend with a crisis. She is having a lot of pain in the left shoulder and thighs. Her hemoglobin had dropped quite a bit. She has a very difficult time getting transfused because of multiple antibodies.  She had a hemoglobin down to 4.1. She had a inadequate reticulocytosis. she is on folic acid. She is to receive some Aranesp today.  Her pain is a whole lot better. She was on a PCA.  Her blood sugars have been under pretty good control. Her renal insufficiency has been fairly stable.  She's had no fever. There's been no cough. She is using the incentive spirometer.     Past Medical History  Diagnosis Date  . Hypertension   . Sickle cell disease ([place])   . Diabetes mellitus   . Anemia, chronic renal failure 08/04/2011  . Sickle cell disease, type [place] ([place]) 08/04/2011  . Blood transfusion without reported diagnosis   . Fatty liver   . Other iron deficiency anemias 10/21/2015  :  Past Surgical History  Procedure Laterality Date  . Cholecystectomy    . Shoulder surgery    . Appendectomy    . Knee surgery    :   Current facility-administered medications:  .   acetaminophen (TYLENOL) tablet 650 mg, 650 mg, Oral, Q4H PRN, 650 mg at 11/23/15 1452 **OR** acetaminophen (TYLENOL) suppository 650 mg, 650 mg, Rectal, Q4H PRN, Timothy S Opyd, MD .  albuterol (PROVENTIL) (2.5 MG/3ML) 0.083% nebulizer solution 2.5 mg, 2.5 mg, Nebulization, Q6H PRN, Ilene Qua Opyd, MD .  allopurinol (ZYLOPRIM) tablet 100 mg, 100 mg, Oral, QODAY, Ilene Qua Opyd, MD, 100 mg at 11/23/15 0956 .  antiseptic oral rinse (CPC / CETYLPYRIDINIUM CHLORIDE 0.05%) solution 7 mL, 7 mL, Mouth Rinse, BID, Leana Gamer, MD, 7 mL at 11/23/15 1000 .  diphenhydrAMINE (BENADRYL) capsule 25-50 mg, 25-50 mg, Oral, Q4H PRN, 25 mg at 11/23/15 1452 **OR** diphenhydrAMINE (BENADRYL) 25 mg in sodium chloride 0.9 % 50 mL IVPB, 25 mg, Intravenous, Q4H PRN, Ilene Qua Opyd, MD .  folic acid (FOLVITE) tablet 2 mg, 2 mg, Oral, Daily, Vianne Bulls, MD, 2 mg at 11/23/15 0955 .  HYDROmorphone (DILAUDID) 1 mg/mL PCA injection, , Intravenous, 6 times per day, Leana Gamer, MD, 1 mg at 11/24/15 0400 .  HYDROmorphone (DILAUDID) injection 1 mg, 1 mg, Intravenous, Q2H PRN, Ilene Qua Opyd, MD, 1 mg at 11/22/15 1017 .  insulin aspart (novoLOG) injection 0-15 Units, 0-15 Units, Subcutaneous, TID WC, Vianne Bulls, MD, 2 Units at 11/23/15 1817 .  insulin glargine (LANTUS) injection 20 Units, 20 Units, Subcutaneous, BID, Vianne Bulls, MD, 20 Units at 11/24/15 0042 .  loratadine (CLARITIN) tablet  10 mg, 10 mg, Oral, Daily, Ilene Qua Opyd, MD, 10 mg at 11/23/15 1000 .  LORazepam (ATIVAN) tablet 0.5-1 mg, 0.5-1 mg, Oral, Q3H PRN **OR** LORazepam (ATIVAN) injection 0.5-1 mg, 0.5-1 mg, Intravenous, Q3H PRN, Ilene Qua Opyd, MD .  metoprolol succinate (TOPROL-XL) 24 hr tablet 150 mg, 150 mg, Oral, Daily, Leana Gamer, MD, 150 mg at 11/23/15 0955 .  mometasone-formoterol (DULERA) 200-5 MCG/ACT inhaler 2 puff, 2 puff, Inhalation, BID, Vianne Bulls, MD, 2 puff at 11/23/15 2043 .  montelukast (SINGULAIR) tablet 10  mg, 10 mg, Oral, q morning - 10a, Ilene Qua Opyd, MD, 10 mg at 11/23/15 0956 .  multivitamin with minerals tablet 1 tablet, 1 tablet, Oral, Daily, Vianne Bulls, MD, 1 tablet at 11/23/15 0956 .  naloxone [facility]) injection 0.4 mg, 0.4 mg, Intravenous, PRN **AND** sodium chloride flush (NS) 0.9 % injection 9 mL, 9 mL, Intravenous, PRN, Leana Gamer, MD .  ondansetron (ZOFRAN) tablet 4 mg, 4 mg, Oral, Q4H PRN **OR** ondansetron (ZOFRAN) injection 4 mg, 4 mg, Intravenous, Q4H PRN, Ilene Qua Opyd, MD .  pantoprazole (PROTONIX) EC tablet 40 mg, 40 mg, Oral, Daily, Vianne Bulls, MD, 40 mg at 11/23/15 0956 .  polyethylene glycol (MIRALAX / GLYCOLAX) packet 17 g, 17 g, Oral, Daily PRN, Ilene Qua Opyd, MD .  polyvinyl alcohol (LIQUIFILM TEARS) 1.4 % ophthalmic solution 1 drop, 1 drop, Both Eyes, PRN, Leana Gamer, MD .  senna-docusate (Senokot-S) tablet 1 tablet, 1 tablet, Oral, BID, Vianne Bulls, MD, 1 tablet at 11/22/15 2310 .  sertraline (ZOLOFT) tablet 100 mg, 100 mg, Oral, Daily, Vianne Bulls, MD, 100 mg at 11/23/15 0956 .  Vitamin D (Ergocalciferol) (DRISDOL) capsule 50,000 Units, 50,000 Units, Oral, Weekly, Ilene Qua Opyd, MD, 50,000 Units at 11/23/15 1001:  . allopurinol  100 mg Oral QODAY  . antiseptic oral rinse  7 mL Mouth Rinse BID  . folic acid  2 mg Oral Daily  . HYDROmorphone   Intravenous 6 times per day  . insulin aspart  0-15 Units Subcutaneous TID WC  . insulin glargine  20 Units Subcutaneous BID  . loratadine  10 mg Oral Daily  . metoprolol succinate  150 mg Oral Daily  . mometasone-formoterol  2 puff Inhalation BID  . montelukast  10 mg Oral q morning - 10a  . multivitamin with minerals  1 tablet Oral Daily  . pantoprazole  40 mg Oral Daily  . senna-docusate  1 tablet Oral BID  . sertraline  100 mg Oral Daily  . Vitamin D (Ergocalciferol)  50,000 Units Oral Weekly  :  Allergies  Allergen Reactions  . Keflex [Cephalexin] Hives  . Codeine Hives  .  Heparin Other (See Comments)    Other Reaction: thrombocytopenia  . Heparin [facility]  . Iohexol Hives    Synthetic xray dye=iohexol   . Ioxaglate Hives  . Ivp Dye [Iodinated Diagnostic Agents] Hives  . Propoxyphene Other (See Comments)    Loopy  . Tape Rash    Skin peels Skin peels  :  Family History  Problem Relation Age of Onset  . Stroke Mother   :  Social History   Social History  . Marital Status: Married    Spouse Name: N/A  . Number of Children: N/A  . Years of Education: N/A   Occupational History  . Not on file.   Social History Main Topics  . Smoking status: Former Research scientist (life sciences)  . Smokeless tobacco:  Not on file  . Alcohol Use: No  . Drug Use: No  . Sexual Activity: Not on file   Other Topics Concern  . Not on file   Social History Narrative  :  Pertinent items are noted in HPI.  Exam: Patient Vitals for the past 24 hrs:  BP Temp Temp src Pulse Resp SpO2  11/24/15 0458 (!) 126/58 mmHg - - (!) 59 15 100 %  11/24/15 0400 - - - - 14 93 %  11/24/15 0040 130/76 mmHg 97.8 F (36.6 C) Oral 69 15 100 %  11/24/15 0000 - - - - 14 100 %  11/23/15 2044 - - - - - 100 %  11/23/15 2030 113/61 mmHg 98.5 F (36.9 C) Oral 81 19 100 %  11/23/15 2000 - - - - 16 100 %  11/23/15 1930 (!) 147/56 mmHg - - 80 17 100 %  11/23/15 1900 (!) 149/55 mmHg 98.2 F (36.8 C) Axillary 86 17 100 %  11/23/15 1845 (!) 150/50 mmHg 98.5 F (36.9 C) Oral 82 17 100 %  11/23/15 1830 (!) 164/52 mmHg - - 85 14 100 %  11/23/15 1800 (!) 138/42 mmHg 98.4 F (36.9 C) Oral 80 18 100 %  11/23/15 1745 (!) 136/42 mmHg - - - 16 -  11/23/15 1730 (!) 120/41 mmHg - - - 16 100 %  11/23/15 1715 (!) 115/34 mmHg - - 79 18 100 %  11/23/15 1700 (!) 129/40 mmHg - - 87 19 100 %  11/23/15 1645 (!) 134/40 mmHg - - 88 18 100 %  11/23/15 1630 (!) 119/42 mmHg - - 93 17 100 %  11/23/15 1615 (!) 120/41 mmHg - - - 20 -  11/23/15 1607 (!) 123/49 mmHg 99 F (37.2 C) Oral 60 19 100 %  11/23/15 1600 (!)  125/47 mmHg - - 93 19 100 %  11/23/15 1552 (!) 122/43 mmHg 98.4 F (36.9 C) Oral - - 100 %  11/23/15 1515 (!) 120/53 mmHg - - (!) 101 (!) 21 100 %  11/23/15 1500 - - - 96 (!) 22 96 %  11/23/15 1400 - - - 91 13 100 %  11/23/15 1300 - - - 90 16 100 %  11/23/15 1200 (!) 134/112 mmHg - - 92 19 100 %  11/23/15 1159 - 98 F (36.7 C) - - - -  11/23/15 1151 - - - - 16 100 %  11/23/15 1100 - - - 94 15 100 %  11/23/15 1000 (!) 121/44 mmHg - - 98 16 100 %  11/23/15 0900 - - - 96 20 100 %  11/23/15 0800 126/67 mmHg - - 87 (!) 22 100 %  11/23/15 0754 - - - - 17 100 %  11/23/15 0744 - - - - 14 100 %   As above    Recent Labs  11/23/15 0302  WBC 9.0  HGB 4.1*  HCT 12.7*  PLT 38*    Recent Labs  11/23/15 0302  NA 142  K 4.5  CL 117*  CO2 18*  GLUCOSE 160*  BUN 26*  CREATININE 1.96*  CALCIUM 8.2*    Blood smear review:  None  Pathology: None     Assessment and Plan:  Ms. Buckhannon is a 75 year old African American female with hemoglobin [place] disease. She has had more problems with anemia. Some of this is from her renal insufficiency. Some, however, is from her underlying sickle cell itself. Again, she has multiple antibodies which makes it incredibly difficult  to transfuse her and to do an exchange on her.  Overall, she looks a was back to baseline. She is not hurting. Maybe the PCA can be discontinued. She does get a bit of confusion with the Dilaudid.  She is on folic acid. I think this is very helpful. She is on supplemental oxygen. She is using the incentive spirometer.  I just don't think that her hemoglobin will improve to levels that it once was. Again, this just might be the natural course of her sickle cell disease.  Her splenomegaly definitely is not anymore prominent. 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  We'll certainly help out in any way to try to get her home. She is worried about going home. She is under a lot  of stress. She is worried that she goes home, she will end up with another crisis. Last time she was hospitalized, we got her into skilled nursing. That may be an option for her.  Again, I very much appreciate the outstanding care that she is gotten during this hospitalization.  Harriette Ohara 33:6

## 2015-11-24 NOTE — Progress Notes (Signed)
SATURATION QUALIFICATIONS: (This note is used to comply with regulatory documentation for home oxygen)  Patient Saturations on Room Air at Rest = 88  Patient Saturations on Room Air while Ambulating = 84%  Patient Saturations on 2 Liters of oxygen while Ambulating = 94%  Please briefly explain why patient needs home oxygen:  Decrease oxygen saturation when ambulating and at rest

## 2015-11-24 NOTE — Progress Notes (Signed)
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 Anemia of Chronic Disease: Pt is status post transfusion of 2 units of blood with a hemoglobin this morning of 6.1. I have ordered Aranesp and the patient should receive the Aranesp today.  2. Thrombocytopenia: There is no evidence of bleeding and platelets are improved today to 58K. 3. Sinus Tachycardia: Resolved 4. Hb [place] with crisis: Pt reports that pain markedly improved today. She rates pain at her baseline of 3-4/10. She is opiate naive. Will discontinue PCA and scheduled oral medication. Also give clinician assisted doses for breakthrough pain.  5. CKD III: Cr currently at baseline. Will obtain BMET tomorrow. 6. H/O HTN: Pt normally has HTN bur currently in normal ranges. BP has improved since HR normalized.  Will resume HCTZ but continue to hold Ramapril and monitor BP.  7.  DM II: Pt normally uses Novolin 70/30 at home. Currently she is on Lantus 20 units BID and Moderate SSI. BS controlled. Pt has a spike in BS likely related to premedication with solumedrol. 8. Left shoulder pain and tenderness: Resolved. Likely related to crisis  Code Status: Full Code Family Communication: N/A Disposition Plan: Anticipate discharge in 1-2 days   Shirel Mallis A.  Pager [phone]. If 7PM-7AM, please contact night-coverage.  11/24/2015, 9:27 AM  LOS: 3 days   Interim History: Pt reports that she feels much better today.  Consultants:  Consult-Dr. Marin Olp.  Procedures:  S/P transfusion 2 units of RBC's  Antibiotics:  None   Objective: Filed Vitals:   11/24/15 0040 11/24/15 0400 11/24/15  0458 11/24/15 0847  BP: 130/76  126/58   Pulse: 69  59   Temp: 97.8 F (36.6 C)     TempSrc: Oral     Resp: 15 14 15 15   Height:      Weight:      SpO2: 100% 93% 100% 100%   Weight change:   Intake/Output Summary (Last 24 hours) at 11/24/15 [id number] Last data filed at 11/23/15 2045  Gross per 24 hour  Intake    740 ml  Output      0 ml  Net    740 ml    General: Alert, awake, oriented x3, appears energetic and much more clinically improved today.  HEENT: [place]/AT PEERL, EOMI, anicteric Neck: Trachea midline,  no masses, no thyromegal,y no JVD, no carotid bruit OROPHARYNX:  Moist, No exudate/ erythema/lesions.  Heart: Regular rate and rhythm, without murmurs, rubs, gallops, PMI non-displaced, no heaves or thrills on palpation.  Lungs: Clear to auscultation, no wheezing or rhonchi noted. No increased vocal fremitus resonant to percussion. Abdomen: Soft, nontender, nondistended, positive bowel sounds, no masses no hepatosplenomegaly noted.  Neuro: No focal neurological deficits noted cranial nerves II through XII grossly intact.  Strength at functional baseline in bilateral upper and lower extremities. Musculoskeletal: No warmth swelling or erythema around joints, Left shoulder tenderness improved over left middle and anterior deltoid. Psychiatric: Patient alert and oriented x3, good insight and cognition, good recent to remote recall.    Data Reviewed: Basic Metabolic Panel:  Recent Labs Lab 11/20/15 2305 11/21/15 0420 11/21/15 0637 11/23/15 0302  NA 140  --  139 142  K 4.9  --  4.5 4.5  CL 116*  --  117* 117*  CO2 15*  --  15* 18*  GLUCOSE 129*  --  125* 160*  BUN 24*  --  23* 26*  CREATININE 1.80*  --  1.85* 1.96*  CALCIUM 8.5*  --  8.1* 8.2*  MG  --  1.8  --   --    Liver Function Tests:  Recent Labs Lab 11/21/15 0637  AST 24  ALT 12*  ALKPHOS 84  BILITOT 1.2  PROT 6.5  ALBUMIN 3.7   No results for input(s): LIPASE, AMYLASE in the last 168 hours. No results  for input(s): AMMONIA in the last 168 hours. CBC:  Recent Labs Lab 11/20/15 2305 11/21/15 0637 11/23/15 0302 11/24/15 0848  WBC 13.9* 9.9 9.0 9.9  NEUTROABS 9.7* 5.6 5.3 PENDING  HGB 5.2* 5.1* 4.1* 6.1*  HCT 15.3* 15.4* 12.7* 18.5*  MCV 70.2* 70.0* 70.9* 74.0*  PLT PLATELET COUNT CONFIRMED BY SMEAR 40* 38* PENDING   Cardiac Enzymes: No results for input(s): CKTOTAL, CKMB, CKMBINDEX, TROPONINI in the last 168 hours. BNP (last 3 results) No results for input(s): BNP in the last 8760 hours.  ProBNP (last 3 results) No results for input(s): PROBNP in the last 8760 hours.  CBG:  Recent Labs Lab 11/23/15 1706 11/23/15 2034 11/23/15 2154 11/24/15 0645 11/24/15 0732  GLUCAP 198* 303* 353* 226* 170*    Recent Results (from the past 240 hour(s))  MRSA PCR Screening     Status: None   Collection Time: 11/21/15  1:52 AM  Result Value Ref Range Status   MRSA by PCR NEGATIVE NEGATIVE Final    Comment:        The GeneXpert MRSA Assay (FDA approved for NASAL specimens only), is one component of a comprehensive MRSA colonization surveillance program. It is not intended to diagnose MRSA infection nor to guide or monitor treatment for MRSA infections.   Culture, blood (routine x 2)     Status: None (Preliminary result)   Collection Time: 11/21/15  3:39 AM  Result Value Ref Range Status   Specimen Description BLOOD LEFT ARM  Final   Special Requests IN PEDIATRIC BOTTLE [place]  Final   Culture   Final    NO GROWTH 2 DAYS Performed at [facility]    Report Status PENDING  Incomplete  Culture, blood (routine x 2)     Status: None (Preliminary result)   Collection Time: 11/21/15  4:20 AM  Result Value Ref Range Status   Specimen Description BLOOD RIGHT ARM  Final   Special Requests BOTTLES DRAWN AEROBIC ONLY [place]  Final   Culture   Final    NO GROWTH 2 DAYS Performed at [facility]    Report Status PENDING  Incomplete  Urine culture     Status: None    Collection Time: 11/22/15  5:30 AM  Result Value Ref Range Status   Specimen Description URINE, CLEAN CATCH  Final   Special Requests Normal  Final   Culture   Final    NO GROWTH 1 DAY Performed at [facility]    Report Status 11/23/2015 FINAL  Final     Studies: No results found.  Scheduled Meds: . allopurinol  100 mg Oral QODAY  . antiseptic oral rinse  7 mL Mouth Rinse BID  . folic acid  2 mg Oral Daily  . HYDROmorphone   Intravenous 6 times per day  .  insulin aspart  0-15 Units Subcutaneous TID WC  . insulin glargine  20 Units Subcutaneous BID  . loratadine  10 mg Oral Daily  . metoprolol succinate  150 mg Oral Daily  . mometasone-formoterol  2 puff Inhalation BID  . montelukast  10 mg Oral q morning - 10a  . multivitamin with minerals  1 tablet Oral Daily  . pantoprazole  40 mg Oral Daily  . senna-docusate  1 tablet Oral BID  . sertraline  100 mg Oral Daily  . Vitamin D (Ergocalciferol)  50,000 Units Oral Weekly   Continuous Infusions:   Time spent 35 minutes.

## 2015-11-24 NOTE — Care Management Important Message (Signed)
Important Message  Patient Details  Name: Rachel Palmer MRN: [id number] Date of Birth: Nov 30, 1940   Medicare Important Message Given:  Yes    Camillo Flaming 11/24/2015, 1:18 PMImportant Message  Patient Details  Name: Rachel Palmer MRN: [id number] Date of Birth: 1941-08-28   Medicare Important Message Given:  Yes    Camillo Flaming 11/24/2015, 1:18 PM

## 2015-11-25 DIAGNOSIS — D638 Anemia in other chronic diseases classified elsewhere: Secondary | ICD-10-CM | POA: Insufficient documentation

## 2015-11-25 DIAGNOSIS — R0609 Other forms of dyspnea: Secondary | ICD-10-CM

## 2015-11-25 LAB — BASIC METABOLIC PANEL
ANION GAP: 8 (ref 5–15)
BUN: 42 mg/dL — ABNORMAL HIGH (ref 6–20)
CHLORIDE: 113 mmol/L — AB (ref 101–111)
CO2: 18 mmol/L — AB (ref 22–32)
Calcium: 8.2 mg/dL — ABNORMAL LOW (ref 8.9–10.3)
Creatinine, Ser: 1.95 mg/dL — ABNORMAL HIGH (ref 0.44–1.00)
GFR calc non Af Amer: 24 mL/min — ABNORMAL LOW (ref >60)
GFR, EST AFRICAN AMERICAN: 28 mL/min — AB (ref >60)
Glucose, Bld: 213 mg/dL — ABNORMAL HIGH (ref 65–99)
Potassium: 5.1 mmol/L (ref 3.5–5.1)
Sodium: 139 mmol/L (ref 135–145)

## 2015-11-25 LAB — GLUCOSE, CAPILLARY
Glucose-Capillary: 131 mg/dL — ABNORMAL HIGH (ref 65–99)
Glucose-Capillary: 153 mg/dL — ABNORMAL HIGH (ref 65–99)

## 2015-11-25 MED ORDER — DARBEPOETIN ALFA 40 MCG/0.4ML IJ SOSY
40.0000 ug | PREFILLED_SYRINGE | Freq: Once | INTRAMUSCULAR | Status: AC
  Administered 2015-11-25: 40 ug via SUBCUTANEOUS
  Filled 2015-11-25: qty 0.4

## 2015-11-25 NOTE — Progress Notes (Signed)
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 Dyspnea on exertion: Patient became short of breath follow-up. She reports that this chronic ongoing condition over the last several years. She reports her last echocardiogram has been more than 5 years ago. In light of her risk of pulmonary hypertension as a consultation of sickle cell I will obtain a 2-D echocardiogram to assess for pulmonary artery hypertension. 2. Anemia of Chronic Disease: Pt is status post transfusion of 2 units of blood with a most recent hemoglobin yesterday of 6.1. I have ordered Aranesp and the patient should receive the Aranesp 60 mcg yesterday. Will check with Pharmacy to verify appropriate dosing. 3. Thrombocytopenia: There is no evidence of bleeding and platelets are improved today to 58K. 4. Sinus Tachycardia: Resolved 5. Hb [place] with crisis: Pt has not required any analgesic medicine since yesterday. From the point of view of her sickle cell crisis she's ready for discharge. 6. CKD III: Patient has had a slight worsening of her BUN and creatinine. This may be secondary to diuretic effect after having been reinstated the hydrochlorothiazide. We'll check labs again in the morning with hemoglobin and renal function are stable we'll plan for discharge. Continue to hold Ramirpil. 7. H/O HTN: Blood pressure within normal ranges. Continue metoprolol and hydrochlorothiazide 8.  DM II: Pt normally uses Novolin 70/30 at home. Currently she is on Lantus 20 units BID and Moderate SSI. BS controlled. Blood sugar with slight elevation around meals. Will add meal coverage for sliding  scale. 9. Left shoulder pain and tenderness: Resolved. Likely related to crisis  Code Status: Full Code Family Communication: N/A Disposition Plan: Anticipate discharge tomorrow   [place] A.  Pager [phone]. If 7PM-7AM, please contact night-coverage.  11/25/2015, 3:30 PM  LOS: 4 days   Interim History: Pt reports that she feels much better today.  Consultants:  Consult-Dr. Marin Olp.  Procedures:  S/P transfusion 2 units of RBC's  Antibiotics:  None   Objective: Filed Vitals:   11/25/15 0550 11/25/15 0953 11/25/15 1200 11/25/15 1459  BP: 154/80   133/54  Pulse: 73   84  Temp: 98 F (36.7 C)   97.9 F (36.6 C)  TempSrc: Oral   Oral  Resp: 16   18  Height:      Weight:      SpO2: 100% 100% 94% 97%   Weight change:  No intake or output data in the 24 hours ending 11/25/15 1530  General: Alert, awake, oriented x3, Well appearing today. HEENT: [place]/AT PEERL, EOMI, anicteric Neck: Trachea midline,  no masses, no thyromegal,y no JVD, no carotid bruit OROPHARYNX:  Moist, No exudate/ erythema/lesions.  Heart: Regular rate and rhythm, without murmurs, rubs, gallops, PMI non-displaced, no heaves or thrills on palpation.  Lungs: Clear to auscultation, no wheezing or rhonchi noted. No increased vocal fremitus resonant to percussion. Abdomen: Soft, nontender, nondistended, positive bowel sounds, no masses no hepatosplenomegaly noted.  Neuro: No focal neurological deficits noted cranial nerves II through XII grossly intact.  Strength at functional baseline in bilateral upper and lower extremities. Musculoskeletal: No warmth swelling or erythema around joints, Left shoulder tenderness improved over left middle  and anterior deltoid. Psychiatric: Patient alert and oriented x3, good insight and cognition, good recent to remote recall.    Data Reviewed: Basic Metabolic Panel:  Recent Labs Lab 11/20/15 2305 11/21/15 0420 11/21/15 0637 11/23/15 0302 11/25/15 1002  NA  140  --  139 142 139  K 4.9  --  4.5 4.5 5.1  CL 116*  --  117* 117* 113*  CO2 15*  --  15* 18* 18*  GLUCOSE 129*  --  125* 160* 213*  BUN 24*  --  23* 26* 42*  CREATININE 1.80*  --  1.85* 1.96* 1.95*  CALCIUM 8.5*  --  8.1* 8.2* 8.2*  MG  --  1.8  --   --   --    Liver Function Tests:  Recent Labs Lab 11/21/15 0637  AST 24  ALT 12*  ALKPHOS 84  BILITOT 1.2  PROT 6.5  ALBUMIN 3.7   No results for input(s): LIPASE, AMYLASE in the last 168 hours. No results for input(s): AMMONIA in the last 168 hours. CBC:  Recent Labs Lab 11/20/15 2305 11/21/15 0637 11/23/15 0302 11/24/15 0848  WBC 13.9* 9.9 9.0 9.9  NEUTROABS 9.7* 5.6 5.3 6.2  HGB 5.2* 5.1* 4.1* 6.1*  HCT 15.3* 15.4* 12.7* 18.5*  MCV 70.2* 70.0* 70.9* 74.0*  PLT PLATELET COUNT CONFIRMED BY SMEAR 40* 38* 53*   Cardiac Enzymes: No results for input(s): CKTOTAL, CKMB, CKMBINDEX, TROPONINI in the last 168 hours. BNP (last 3 results) No results for input(s): BNP in the last 8760 hours.  ProBNP (last 3 results) No results for input(s): PROBNP in the last 8760 hours.  CBG:  Recent Labs Lab 11/24/15 0732 11/24/15 1150 11/24/15 1642 11/24/15 2154 11/25/15 0759  GLUCAP 170* 172* 147* 174* 131*    Recent Results (from the past 240 hour(s))  MRSA PCR Screening     Status: None   Collection Time: 11/21/15  1:52 AM  Result Value Ref Range Status   MRSA by PCR NEGATIVE NEGATIVE Final    Comment:        The GeneXpert MRSA Assay (FDA approved for NASAL specimens only), is one component of a comprehensive MRSA colonization surveillance program. It is not intended to diagnose MRSA infection nor to guide or monitor treatment for MRSA infections.   Culture, blood (routine x 2)     Status: None (Preliminary result)   Collection Time: 11/21/15  3:39 AM  Result Value Ref Range Status   Specimen Description BLOOD LEFT ARM  Final   Special Requests IN PEDIATRIC BOTTLE [place]  Final   Culture   Final    NO GROWTH 4  DAYS Performed at [facility]    Report Status PENDING  Incomplete  Culture, blood (routine x 2)     Status: None (Preliminary result)   Collection Time: 11/21/15  4:20 AM  Result Value Ref Range Status   Specimen Description BLOOD RIGHT ARM  Final   Special Requests BOTTLES DRAWN AEROBIC ONLY [place]  Final   Culture   Final    NO GROWTH 4 DAYS Performed at [facility]    Report Status PENDING  Incomplete  Urine culture     Status: None   Collection Time: 11/22/15  5:30 AM  Result Value Ref Range Status   Specimen Description URINE, CLEAN CATCH  Final   Special Requests Normal  Final   Culture   Final    NO GROWTH 1 DAY Performed at [facility]  Report Status 11/23/2015 FINAL  Final     Studies: No results found.  Scheduled Meds: . allopurinol  100 mg Oral QODAY  . antiseptic oral rinse  7 mL Mouth Rinse BID  . darbepoetin (ARANESP) injection - NON-DIALYSIS  60 mcg Subcutaneous Q Wed-1800  . folic acid  2 mg Oral Daily  . hydrochlorothiazide  25 mg Oral Daily  . insulin aspart  0-15 Units Subcutaneous TID WC  . insulin glargine  20 Units Subcutaneous BID  . loratadine  10 mg Oral Daily  . metoprolol succinate  150 mg Oral Daily  . mometasone-formoterol  2 puff Inhalation BID  . montelukast  10 mg Oral q morning - 10a  . multivitamin with minerals  1 tablet Oral Daily  . pantoprazole  40 mg Oral Daily  . senna-docusate  1 tablet Oral BID  . sertraline  100 mg Oral Daily  . Vitamin D (Ergocalciferol)  50,000 Units Oral Weekly   Continuous Infusions:   Time spent 30 minutes.

## 2015-11-25 NOTE — Progress Notes (Signed)
Inpatient Diabetes Program Recommendations  AACE/ADA: New Consensus Statement on Inpatient Glycemic Control (2015)  Target Ranges:  Prepandial:   less than 140 mg/dL      Peak postprandial:   less than 180 mg/dL (1-2 hours)      Critically ill patients:  140 - 180 mg/dL   Review of Glycemic Control  Results for KARENLEE, FENTON (MRN [id number]) as of 11/25/2015 11:59  Ref. Range 11/24/2015 07:32 11/24/2015 11:50 11/24/2015 16:42 11/24/2015 21:54 11/25/2015 07:59  Glucose-Capillary Latest Ref Range: 65-99 mg/dL 170 (H) 172 (H) 147 (H) 174 (H) 131 (H)   CBG this am - 213 mg/dL.  Pt states she has good appetite and is eating well. Home meds: 70/30 33 units bid  Will likely need meal coverage insulin for post-prandial hyperglycemia.  Inpatient Diabetes Program Recommendations:    Add Novolog 3 units tidwc for meal coverage insulin.  Will continue to follow. Thank you. Lorenda Peck, RD, LDN, CDE Inpatient Diabetes Coordinator [phone]

## 2015-11-25 NOTE — Progress Notes (Signed)
Mrs. Kosiorek looks quite good. She now is off IV pain medication. She still needs supplemental oxygen. Her oxygen saturation drops quite a bit when she ambulates. I just wonder if she cannot have some pulmonary hypertension.  She says she is not hurting. She seems to be pretty active. Her appetite seems to be pretty good.  Hemoglobin yesterday was 6.1. She did get a booster of Aranesp. Her platelets was 53,000.  She's had no fever. She is had no cough.  All cultures have been negative.  On her physical exam, her vital signs look stable. Her lungs sound clear bilaterally. I hear no crackles. Cardiac exam regular rate and rhythm with no murmurs, rubs or bruits. Abdomen is soft, she is mildly obese. There is no palpable liver or spleen tip. Extremities show some trace edema in her legs. Skin exam shows no rashes. Neurological exam shows no focal deficits.  Hopefully, she will be able to go home today. Her platelets are starting to come up a little bit.  I did check the bone marrow results that she had back in November. The bone marrow really was unremarkable outside of changes consistent with the sickle cell disease.  Again, she does go home, I will be would have her do follow her up as an outpatient.  I very much appreciate the outstanding care that she is getting up on 5 E.!!!!  Pete E.  Romans 8:28

## 2015-11-25 NOTE — Progress Notes (Signed)
Patient ambulated in the hall without oxygen, and oxygen saturation never got below 94 percent.  Patient left off of oxygen and instructed to let staff know if she becomes short of breath.

## 2015-11-25 NOTE — Progress Notes (Signed)
[place] for Aranesp Indication: Anemia (CKD + SCD)  Allergies  Allergen Reactions  . Keflex [Cephalexin] Hives  . Codeine Hives  . Heparin Other (See Comments)    Other Reaction: thrombocytopenia  . Heparin [facility]  . Iohexol Hives    Synthetic xray dye=iohexol   . Ioxaglate Hives  . Ivp Dye [Iodinated Diagnostic Agents] Hives  . Propoxyphene Other (See Comments)    Loopy  . Tape Rash    Skin peels Skin peels    Patient Measurements: Height: 5\' 5"  (165.1 cm) Weight: 194 lb 10.7 oz (88.3 kg) IBW/kg (Calculated) : 57  Vital Signs: Temp: 97.9 F (36.6 C) (03/09 1459) Temp Source: Oral (03/09 1459) BP: 133/54 mmHg (03/09 1459) Pulse Rate: 84 (03/09 1459) Intake/Output from previous day:   Intake/Output from this shift:    Labs:  Recent Labs  11/23/15 0302 11/24/15 0848 11/25/15 1002  WBC 9.0 9.9  --   HGB 4.1* 6.1*  --   HCT 12.7* 18.5*  --   PLT 38* 53*  --   CREATININE 1.96*  --  1.95*   Estimated Creatinine Clearance: 28.6 mL/min (by C-G formula based on Cr of 1.95).  Medical History: Past Medical History  Diagnosis Date  . Hypertension   . Sickle cell disease ([place])   . Diabetes mellitus   . Anemia, chronic renal failure 08/04/2011  . Sickle cell disease, type [place] ([place]) 08/04/2011  . Blood transfusion without reported diagnosis   . Fatty liver   . Other iron deficiency anemias 10/21/2015    Assessment: 62 [facility].  PMH CKD, splenomegaly, thrombocytopenia (chronic).  Currently receiving Epoetin at [place]; 40k units every 2-4 weeks depending on severity of anemia.  Pharmacy consulted to dose Aranesp while admitted.  Today, 11/25/2015:  Hgb 6.1, up from 4.1 yesterday following 2 units PRBC  Most recent EPO dose 40,000 units on 10/21/15, with instructions to return in 3 wks -Receives q2-4 weeks based on labs  After discussion with MD, reasonable to increase aranesp  total dose to 126mcg while inpatient  Goal of Therapy:  Prevent need for blood transfusions (patient does have multiple antibodies to blood products)  Plan:  Additional Aranesp 38mcg x 1 today to make a total dose of 123mcg (including 51mcg from 3/8) F/u CBC  Ralene Bathe, PharmD, BCPS 11/25/2015, 4:16 PM  Pager: [phone]

## 2015-11-26 ENCOUNTER — Ambulatory Visit: Payer: Medicare Other | Admitting: Hematology & Oncology

## 2015-11-26 ENCOUNTER — Other Ambulatory Visit: Payer: Medicare Other

## 2015-11-26 ENCOUNTER — Ambulatory Visit: Payer: Medicare Other

## 2015-11-26 ENCOUNTER — Inpatient Hospital Stay (HOSPITAL_COMMUNITY): Payer: Medicare Other

## 2015-11-26 DIAGNOSIS — R0600 Dyspnea, unspecified: Secondary | ICD-10-CM

## 2015-11-26 LAB — CBC WITH DIFFERENTIAL/PLATELET
BASOS ABS: 0.1 10*3/uL (ref 0.0–0.1)
BASOS PCT: 1 %
EOS PCT: 2 %
Eosinophils Absolute: 0.2 10*3/uL (ref 0.0–0.7)
HEMATOCRIT: 17.3 % — AB (ref 36.0–46.0)
HEMOGLOBIN: 5.8 g/dL — AB (ref 12.0–15.0)
LYMPHS ABS: 2.9 10*3/uL (ref 0.7–4.0)
Lymphocytes Relative: 34 %
MCH: 24.1 pg — ABNORMAL LOW (ref 26.0–34.0)
MCHC: 33.5 g/dL (ref 30.0–36.0)
MCV: 71.8 fL — AB (ref 78.0–100.0)
MONOS PCT: 7 %
Monocytes Absolute: 0.6 10*3/uL (ref 0.1–1.0)
NEUTROS ABS: 4.8 10*3/uL (ref 1.7–7.7)
Neutrophils Relative %: 56 %
Platelets: 38 10*3/uL — ABNORMAL LOW (ref 150–400)
RBC: 2.41 MIL/uL — AB (ref 3.87–5.11)
RDW: 23.1 % — AB (ref 11.5–15.5)
WBC: 8.6 10*3/uL (ref 4.0–10.5)
nRBC: 14 /100 WBC — ABNORMAL HIGH

## 2015-11-26 LAB — ECHOCARDIOGRAM COMPLETE
Height: 65 in
WEIGHTICAEL: 3114.66 [oz_av]

## 2015-11-26 LAB — CULTURE, BLOOD (ROUTINE X 2)
CULTURE: NO GROWTH
CULTURE: NO GROWTH

## 2015-11-26 LAB — BASIC METABOLIC PANEL
ANION GAP: 7 (ref 5–15)
BUN: 41 mg/dL — ABNORMAL HIGH (ref 6–20)
CALCIUM: 8 mg/dL — AB (ref 8.9–10.3)
CHLORIDE: 110 mmol/L (ref 101–111)
CO2: 19 mmol/L — AB (ref 22–32)
Creatinine, Ser: 1.89 mg/dL — ABNORMAL HIGH (ref 0.44–1.00)
GFR calc non Af Amer: 25 mL/min — ABNORMAL LOW (ref >60)
GFR, EST AFRICAN AMERICAN: 29 mL/min — AB (ref >60)
GLUCOSE: 163 mg/dL — AB (ref 65–99)
Potassium: 4.1 mmol/L (ref 3.5–5.1)
Sodium: 136 mmol/L (ref 135–145)

## 2015-11-26 LAB — RETICULOCYTES
RBC.: 2.41 MIL/uL — AB (ref 3.87–5.11)
RETIC COUNT ABSOLUTE: 209.7 10*3/uL — AB (ref 19.0–186.0)
Retic Ct Pct: 8.7 % — ABNORMAL HIGH (ref 0.4–3.1)

## 2015-11-26 LAB — GLUCOSE, CAPILLARY: GLUCOSE-CAPILLARY: 153 mg/dL — AB (ref 65–99)

## 2015-11-26 NOTE — Progress Notes (Signed)
Date:  November 26, 2015 Chart reviewed for concurrent status and case management needs. Will continue to follow patient for changes and needs: Velva Harman, BSN, [place], [place]   [phone]

## 2015-11-26 NOTE — Discharge Summary (Signed)
Rachel Palmer MRN: [id number] DOB/AGE: [birth year] 74 y.o.  Admit date: 11/20/2015 Discharge date: 11/26/2015  Primary Care Physician:  Maylon Peppers, MD   Discharge Diagnoses:   Patient Active Problem List   Diagnosis Date Noted  . Dyspnea on exertion   . Anemia of chronic disease   . Anemia 11/21/2015  . Sickle cell pain crisis ([place]) 11/21/2015  . Insulin dependent diabetes mellitus ([place]) 11/21/2015  . Sickle cell anemia with pain ([place]) 11/21/2015  . Other iron deficiency anemias 10/21/2015  . Metabolic acidosis, normal anion gap (NAG) 08/10/2015  . Gout 08/10/2015  . Type 2 diabetes mellitus with stage 3 chronic kidney disease ([place])   . Splenomegaly   . Microcytic anemia   . Essential hypertension   . Stage III chronic kidney disease   . Anemia in chronic kidney disease 07/29/2015  . Excess or deficiency of vitamin D 07/26/2012  . Multinodular goiter 07/25/2012  . Enlargement of spleen 07/25/2012  . Thrombocytopenia ([place]) 07/25/2012  . Airway hyperreactivity 03/26/2012  . Obstructive apnea 03/26/2012  . Arthritis of knee 12/18/2011  . Gonalgia 12/18/2011  . Chronic kidney disease (CKD), stage IV (severe) ([place]) 08/18/2011  . Type 2 diabetes mellitus ([place]) 08/18/2011  . Acid reflux 08/18/2011  . BP (high blood pressure) 08/18/2011  . 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 Anemia, chronic renal failure 08/04/2011  . Sickle cell disease, type  ([place]) 08/04/2011    DISCHARGE MEDICATION:   Medication List    TAKE these medications        albuterol 108 (90 Base) MCG/ACT inhaler  Commonly known as:  PROVENTIL HFA;VENTOLIN HFA  Inhale 2 puffs into the lungs every 6 (six) hours as needed for wheezing or shortness of breath.     allopurinol 100 MG tablet  Commonly known as:  ZYLOPRIM  Take 100 mg by mouth every other day.     cetirizine 10 MG tablet  Commonly known as:  ZYRTEC  Take 10 mg by mouth daily.     ergocalciferol 50000 units capsule   Commonly known as:  VITAMIN D2  Take 50,000 Units by mouth once a week. Tuesday     Fluticasone-Salmeterol 500-50 MCG/DOSE Aepb  Commonly known as:  ADVAIR  Inhale 1 puff into the lungs 2 (two) times daily.     folic acid 1 MG tablet  Commonly known as:  FOLVITE  Take 2 tablets (2 mg total) by mouth daily.     furosemide 20 MG tablet  Commonly known as:  LASIX  Take 20 mg by mouth as needed for fluid.     hydrochlorothiazide 25 MG tablet  Commonly known as:  HYDRODIURIL  Take 25 mg by mouth daily.     insulin aspart protamine- aspart (70-30) 100 UNIT/ML injection  Commonly known as:  NOVOLOG MIX 70/30  Inject 33-35 Units into the skin 2 (two) times daily. 33 units in the morning and 35 units at bedtime     metoprolol succinate 100 MG 24 hr tablet  Commonly known as:  TOPROL-XL  Take 100 mg by mouth daily. Take with or immediately following a meal.     montelukast 10 MG tablet  Commonly known as:  SINGULAIR  Take 10 mg by mouth every morning.     multivitamin with minerals Tabs tablet  Take 1 tablet by mouth daily.     omeprazole 40 MG capsule  Commonly known as:  PRILOSEC  Take 40 mg by mouth daily.  oxycodone 5 MG capsule  Commonly known as:  OXY-IR  Take 1 capsule (5 mg total) by mouth every 4 (four) hours as needed for pain.     PRESCRIPTION MEDICATION  Supportive Therapy [facility]     ramipril 10 MG capsule  Commonly known as:  ALTACE  Take 10 mg by mouth.     sertraline 100 MG tablet  Commonly known as:  ZOLOFT  Take 100 mg by mouth.          Consults: Treatment Team:  Volanda Napoleon, MD   SIGNIFICANT DIAGNOSTIC STUDIES:  No results found.   ECHO:Completed. Results pending.   OTHER PROCEDURES: Transfusion 2 units RBC's  Recent Results (from the past 240 hour(s))  MRSA PCR Screening     Status: None   Collection Time: 11/21/15  1:52 AM  Result Value Ref Range Status   MRSA by PCR NEGATIVE NEGATIVE Final    Comment:        The GeneXpert  MRSA Assay (FDA approved for NASAL specimens only), is one component of a comprehensive MRSA colonization surveillance program. It is not intended to diagnose MRSA infection nor to guide or monitor treatment for MRSA infections.   Culture, blood (routine x 2)     Status: None (Preliminary result)   Collection Time: 11/21/15  3:39 AM  Result Value Ref Range Status   Specimen Description BLOOD LEFT ARM  Final   Special Requests IN PEDIATRIC BOTTLE [place]  Final   Culture   Final    NO GROWTH 4 DAYS Performed at [facility]    Report Status PENDING  Incomplete  Culture, blood (routine x 2)     Status: None (Preliminary result)   Collection Time: 11/21/15  4:20 AM  Result Value Ref Range Status   Specimen Description BLOOD RIGHT ARM  Final   Special Requests BOTTLES DRAWN AEROBIC ONLY [place]  Final   Culture   Final    NO GROWTH 4 DAYS Performed at [facility]    Report Status PENDING  Incomplete  Urine culture     Status: None   Collection Time: 11/22/15  5:30 AM  Result Value Ref Range Status   Specimen Description URINE, CLEAN CATCH  Final   Special Requests Normal  Final   Culture   Final    NO GROWTH 1 DAY Performed at [facility]    Report Status 11/23/2015 FINAL  Final    BRIEF ADMITTING H & P: Rachel Palmer is a 75 y.o. female with PMH of sickle cell disease, insulin-dependent diabetes mellitus, hypertension, and chronic kidney disease stage 3-4 who presents to the [facility] ED with painful crisis. Patient complained of approximately one day of 10/10 pain in her extremities consistent with prior pain crises. She reported pain being worse in the left upper and lower extremity, as she explains is typical for her pain crises. She denied any preceding fever, chills, chest pain, or dyspnea. There is no new cough, dysuria, or diarrhea. She denies sick contacts. She tried managing her symptoms at home with increased oral fluid intake and 5 mg PO Dilaudid, but  without appreciable improvement. BMP was performed and notable for serum bicarbonate of 15 and serum creatinine of 1.8, up from an apparent baseline of 1.6. CBC is concerning for a hemoglobin of 5.2, down from her typical baseline of 7-8. Leukocytosis to 14,000 is also noted. Patient was treated with IV fluids and IV hydromorphone in the emergency department at [facility]  [place] but her intense pain persisted. Hospitalist service at [facility] was consulted for admission and patient was transported via [place].  Ms. Egerer was evaluated in the stepdown unit at [facility] where she is noted to be afebrile, saturating at 100% on 2 L/m, and with vital signs stable. She reports improvement in her pain since her arrival at the outside hospital and continues to deny fevers, chills, cough, dysuria, or diarrhea. She reports that pain is most severe in the left upper and lower extremities, explaining this is typical of her pain crises. She denies melena, hematochezia, or hematuria. Plan for pain control, cultures, and transfusion of packed red blood cells was discussed. Patient refuses blood transfusion at this time, while acknowledging that her hemoglobin is only 5.2 and life-threateningly low. She is hopeful that her hemoglobin will recover before worsening and understands that her treatment team is making a very strong recommendation for transfusion, fearing that she could suffer complications, including death, by refusing this treatment. Patient's wishes will be honored and she will sign a blood refusal form. She is willing to revisit this after repeat H&H in a couple hours and understands that the blood bank will be asked to prepare 2 units for her. She will be admitted to the stepdown unit for ongoing management of sickle cell disease with pain crises and life-threatening anemia   Hospital Course:  Present on Admission:  . Anemia of Chronic Disease: Disc laxity patient was anemia of chronic disease and poor  bone marrow response. She also has chronic renal disease which likely contributes to her anemia of chronic disease. She typically has erythropoietin administered on a every other month basis in the outpatient setting. She presented to the hospital with a hemoglobin of 5.2 which decreased to a nadir of 4.1. Blood was ordered for the patient however due to antibodies is difficult to acquire. The patient also has warm antibodies which presented additional biliary transfusion. However once the most compatible blood was obtained she was premedicated with steroids and Benadryl and Tylenol and she was transfused one unit of blood without any incident. At the time of her discharge her hemoglobin was 5.8 grams per deciliter with a reticulocyte count of 8.7% and a manual count of 209.7. Additionally she received Aranesp a total dose of 100 g administered on 2 separate days. She is to follow-up with Dr. Marin Olp in the outpatient setting for further management of her anemia chronic disease.  . Chronic kidney disease (CKD), stage IV (severe) ([place]): Pt had a mild decrease in renal function on admission. However with hydration we are function returned to baseline.  Marland Kitchen Dyspnea on Exertion: Patient has a chronic complaint of dyspnea and exertion which she states she's had for almost one year now it is unclear as to the reason behind it. She had no hypoxemia and patient states that this was at her baseline of function. An echocardiogram was ordered and performed here during her hospitalization with results pending. I have put a call into her primary care doctor Dr. Trudie Reed to follow-up on her echocardiogram and also to pursue workup for her dyspnea on exertion. . Sickle cell pain crisis ([place]): The patient was treated with the Dilaudid PCA with good resolution of her pain. She was transitioned to oral medications and remained on them until her discharge from hospital. She is discharged home on her usual pain medications.  .  Tachycardia: The patient initially had tachycardia however was noted that she had not been resumed on  her metoprolol which she takes on a chronic basis. Once her metoprolol was resume her heart rate improved as did her blood pressure. . Essential HTN: Patient initially had a marginal blood pressure however once heart rate was controlled and she was transfused blood pressure resumed to normal stages. She is discharged home on metoprolol, hydrochlorothiazide and Ramapril. He is to follow-up with her primary care physician for further management of her hypertension. . DM II: Patient normally takes 7030 at home. She was resumed on her 7030 insulin at discharge however during her hospitalization she was managed with Lantus 20 units in addition to meal coverage.  Disposition and Follow-up:  Pt to follow up with Dr. Marin Olp and their office will call patient to schedule appointment.  Also follow up with Dr. Trudie Reed    DISCHARGE EXAM:  General: Alert, awake, oriented x3, in no apparent distress. Well appearing. Vital Signs: BP 137/55, HR 76, T 98 F (36.7 C), temperature source Oral, RR 16, height 5\' 5"  (1.651 m), weight 194 lb 10.7 oz (88.3 kg), SpO2 99 % on RA at rest. HEENT: [place]/AT PEERL, EOMI, anicteric Neck: Trachea midline, no masses, no thyromegal,y no JVD, no carotid bruit OROPHARYNX: Moist, No exudate/ erythema/lesions.  Heart: Regular rate and rhythm, without murmurs, rubs, gallops or S3. PMI non-displaced. Exam reveals no decreased pulses. Pulmonary/Chest: Normal effort. Breath sounds normal. No. Apnea. Clear to auscultation,no stridor,  no wheezing and no rhonchi noted. No respiratory distress and no tenderness noted. Abdomen:  Obese, soft, nontender, nondistended, normal bowel sounds, no masses no hepatosplenomegaly noted. No fluid wave and no ascites. There is no guarding or rebound. Neuro: Alert and oriented to person, place and time. Normal motor skills, Displays no atrophy or tremors and  exhibits normal muscle tone.  No focal neurological deficits noted cranial nerves II through XII grossly intact. No sensory deficit noted. Strength at baseline in bilateral upper and lower extremities. Gait normal. Musculoskeletal: No warm swelling or erythema around joints, no spinal tenderness noted. Psychiatric: Patient alert and oriented x3, good insight and cognition, good recent to remote recall. Mood, memory, affect and judgement normal Lymph node survey: No cervical axillary or inguinal lymphadenopathy noted. Skin: Skin is warm and dry. No bruising, no ecchymosis and no rash noted. Pt is not diaphoretic. No erythema. No pallor      Recent Labs  11/25/15 1002 11/26/15 0515  NA 139 136  K 5.1 4.1  CL 113* 110  CO2 18* 19*  GLUCOSE 213* 163*  BUN 42* 41*  CREATININE 1.95* 1.89*  CALCIUM 8.2* 8.0*   No results for input(s): AST, ALT, ALKPHOS, BILITOT, PROT, ALBUMIN in the last 72 hours. No results for input(s): LIPASE, AMYLASE in the last 72 hours.  Recent Labs  11/24/15 0848 11/26/15 0515  WBC 9.9 8.6  NEUTROABS 6.2 4.8  HGB 6.1* 5.8*  HCT 18.5* 17.3*  MCV 74.0* 71.8*  PLT 53* 38*     Total time spent including face to face and decision making was greater than 30 minutes  Signed: MATTHEWS,MICHELLE A. 11/26/2015, 11:03 AM

## 2015-11-26 NOTE — Progress Notes (Signed)
Patient given discharge instructions, and verbalized an understanding of all discharge instructions.  Patient agrees with discharge plan, and is being discharged in stable medical condition.  Patient given transportation via wheelchair. 

## 2015-11-26 NOTE — Progress Notes (Signed)
Echocardiogram 2D Echocardiogram has been performed.  Tresa Res 11/26/2015, 11:07 AM

## 2015-11-29 ENCOUNTER — Ambulatory Visit: Payer: Medicare Other

## 2015-11-29 ENCOUNTER — Ambulatory Visit: Payer: Medicare Other | Admitting: Hematology & Oncology

## 2015-11-29 ENCOUNTER — Other Ambulatory Visit: Payer: Medicare Other

## 2015-12-13 DIAGNOSIS — R5383 Other fatigue: Secondary | ICD-10-CM

## 2015-12-13 DIAGNOSIS — J301 Allergic rhinitis due to pollen: Secondary | ICD-10-CM | POA: Insufficient documentation

## 2015-12-13 DIAGNOSIS — R000 Tachycardia, unspecified: Secondary | ICD-10-CM | POA: Insufficient documentation

## 2015-12-13 DIAGNOSIS — S0990XA Unspecified injury of head, initial encounter: Secondary | ICD-10-CM | POA: Insufficient documentation

## 2015-12-13 DIAGNOSIS — R5381 Other malaise: Secondary | ICD-10-CM | POA: Insufficient documentation

## 2015-12-16 ENCOUNTER — Ambulatory Visit: Payer: Medicare Other | Admitting: Hematology & Oncology

## 2015-12-16 ENCOUNTER — Ambulatory Visit: Payer: Medicare Other

## 2015-12-16 ENCOUNTER — Other Ambulatory Visit: Payer: Medicare Other

## 2015-12-17 ENCOUNTER — Ambulatory Visit (HOSPITAL_BASED_OUTPATIENT_CLINIC_OR_DEPARTMENT_OTHER): Payer: Medicare Other | Admitting: Family

## 2015-12-17 ENCOUNTER — Ambulatory Visit: Payer: Medicare Other

## 2015-12-17 ENCOUNTER — Encounter: Payer: Self-pay | Admitting: Family

## 2015-12-17 ENCOUNTER — Other Ambulatory Visit (HOSPITAL_BASED_OUTPATIENT_CLINIC_OR_DEPARTMENT_OTHER): Payer: Medicare Other

## 2015-12-17 VITALS — BP 159/75 | HR 96 | Temp 97.8°F | Resp 14 | Ht 65.0 in | Wt 189.0 lb

## 2015-12-17 DIAGNOSIS — D5720 Sickle-cell/Hb-C disease without crisis: Secondary | ICD-10-CM

## 2015-12-17 DIAGNOSIS — D696 Thrombocytopenia, unspecified: Secondary | ICD-10-CM | POA: Diagnosis not present

## 2015-12-17 DIAGNOSIS — D631 Anemia in chronic kidney disease: Secondary | ICD-10-CM

## 2015-12-17 DIAGNOSIS — R161 Splenomegaly, not elsewhere classified: Secondary | ICD-10-CM

## 2015-12-17 DIAGNOSIS — D508 Other iron deficiency anemias: Secondary | ICD-10-CM

## 2015-12-17 DIAGNOSIS — N184 Chronic kidney disease, stage 4 (severe): Secondary | ICD-10-CM

## 2015-12-17 DIAGNOSIS — D571 Sickle-cell disease without crisis: Secondary | ICD-10-CM

## 2015-12-17 LAB — CBC WITH DIFFERENTIAL (CANCER CENTER ONLY)
BASO#: 0 10*3/uL (ref 0.0–0.2)
BASO%: 0.5 % (ref 0.0–2.0)
EOS%: 0.8 % (ref 0.0–7.0)
Eosinophils Absolute: 0.1 10*3/uL (ref 0.0–0.5)
HEMATOCRIT: 29.6 % — AB (ref 34.8–46.6)
HEMOGLOBIN: 10.1 g/dL — AB (ref 11.6–15.9)
LYMPH#: 1.8 10*3/uL (ref 0.9–3.3)
LYMPH%: 26.8 % (ref 14.0–48.0)
MCH: 25.5 pg — ABNORMAL LOW (ref 26.0–34.0)
MCHC: 34.1 g/dL (ref 32.0–36.0)
MCV: 75 fL — AB (ref 81–101)
MONO#: 0.5 10*3/uL (ref 0.1–0.9)
MONO%: 7.2 % (ref 0.0–13.0)
NEUT%: 64.7 % (ref 39.6–80.0)
NEUTROS ABS: 4.2 10*3/uL (ref 1.5–6.5)
PLATELETS: 96 10*3/uL — AB (ref 145–400)
RBC: 3.96 10*6/uL (ref 3.70–5.32)
RDW: 22.8 % — ABNORMAL HIGH (ref 11.1–15.7)
WBC: 6.5 10*3/uL (ref 3.9–10.0)

## 2015-12-17 LAB — COMPREHENSIVE METABOLIC PANEL
ALBUMIN: 4.3 g/dL (ref 3.5–5.0)
ALK PHOS: 109 U/L (ref 40–150)
ALT: 10 U/L (ref 0–55)
ANION GAP: 10 meq/L (ref 3–11)
AST: 12 U/L (ref 5–34)
BILIRUBIN TOTAL: 0.98 mg/dL (ref 0.20–1.20)
BUN: 22.6 mg/dL (ref 7.0–26.0)
CALCIUM: 8.8 mg/dL (ref 8.4–10.4)
CO2: 22 mEq/L (ref 22–29)
CREATININE: 1.8 mg/dL — AB (ref 0.6–1.1)
Chloride: 111 mEq/L — ABNORMAL HIGH (ref 98–109)
EGFR: 31 mL/min/{1.73_m2} — AB (ref >90)
Glucose: 192 mg/dl — ABNORMAL HIGH (ref 70–140)
Potassium: 4.1 mEq/L (ref 3.5–5.1)
Sodium: 143 mEq/L (ref 136–145)
TOTAL PROTEIN: 7.7 g/dL (ref 6.4–8.3)

## 2015-12-17 LAB — IRON AND TIBC
%SAT: 29 % (ref 21–57)
IRON: 62 ug/dL (ref 41–142)
TIBC: 211 ug/dL — ABNORMAL LOW (ref 236–444)
UIBC: 150 ug/dL (ref 120–384)

## 2015-12-17 LAB — FERRITIN
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
## 2015-12-18 LAB — RETICULOCYTES: Reticulocyte Count: 5.4 % — ABNORMAL HIGH (ref 0.6–2.6)

## 2016-01-07 ENCOUNTER — Encounter: Payer: Self-pay | Admitting: Hematology & Oncology

## 2016-01-25 ENCOUNTER — Ambulatory Visit (HOSPITAL_BASED_OUTPATIENT_CLINIC_OR_DEPARTMENT_OTHER): Payer: Medicare Other | Admitting: Family

## 2016-01-25 ENCOUNTER — Other Ambulatory Visit (HOSPITAL_BASED_OUTPATIENT_CLINIC_OR_DEPARTMENT_OTHER): Payer: Medicare Other

## 2016-01-25 ENCOUNTER — Ambulatory Visit: Payer: Medicare Other

## 2016-01-25 ENCOUNTER — Encounter: Payer: Self-pay | Admitting: Family

## 2016-01-25 VITALS — BP 148/67 | HR 74 | Temp 97.5°F | Resp 18 | Ht 65.0 in | Wt 193.0 lb

## 2016-01-25 DIAGNOSIS — D571 Sickle-cell disease without crisis: Secondary | ICD-10-CM

## 2016-01-25 DIAGNOSIS — N189 Chronic kidney disease, unspecified: Secondary | ICD-10-CM

## 2016-01-25 DIAGNOSIS — N184 Chronic kidney disease, stage 4 (severe): Principal | ICD-10-CM

## 2016-01-25 DIAGNOSIS — D631 Anemia in chronic kidney disease: Secondary | ICD-10-CM

## 2016-01-25 DIAGNOSIS — R161 Splenomegaly, not elsewhere classified: Secondary | ICD-10-CM | POA: Diagnosis not present

## 2016-01-25 DIAGNOSIS — D508 Other iron deficiency anemias: Secondary | ICD-10-CM

## 2016-01-25 DIAGNOSIS — D696 Thrombocytopenia, unspecified: Secondary | ICD-10-CM | POA: Diagnosis not present

## 2016-01-25 LAB — CBC WITH DIFFERENTIAL (CANCER CENTER ONLY)
BASO#: 0 10*3/uL (ref 0.0–0.2)
BASO%: 0.4 % (ref 0.0–2.0)
EOS%: 1.6 % (ref 0.0–7.0)
Eosinophils Absolute: 0.1 10*3/uL (ref 0.0–0.5)
HEMATOCRIT: 28.3 % — AB (ref 34.8–46.6)
HGB: 9.8 g/dL — ABNORMAL LOW (ref 11.6–15.9)
LYMPH#: 1.9 10*3/uL (ref 0.9–3.3)
LYMPH%: 27.7 % (ref 14.0–48.0)
MCH: 24.7 pg — ABNORMAL LOW (ref 26.0–34.0)
MCHC: 34.6 g/dL (ref 32.0–36.0)
MCV: 71 fL — AB (ref 81–101)
MONO#: 0.5 10*3/uL (ref 0.1–0.9)
MONO%: 6.6 % (ref 0.0–13.0)
NEUT#: 4.4 10*3/uL (ref 1.5–6.5)
NEUT%: 63.7 % (ref 39.6–80.0)
Platelets: 80 10*3/uL — ABNORMAL LOW (ref 145–400)
RBC: 3.97 10*6/uL (ref 3.70–5.32)
RDW: 21.1 % — AB (ref 11.1–15.7)
WBC: 6.9 10*3/uL (ref 3.9–10.0)

## 2016-01-25 LAB — COMPREHENSIVE METABOLIC PANEL
ALK PHOS: 84 U/L (ref 40–150)
ALT: 10 U/L (ref 0–55)
ANION GAP: 8 meq/L (ref 3–11)
AST: 12 U/L (ref 5–34)
Albumin: 4.1 g/dL (ref 3.5–5.0)
BUN: 31.3 mg/dL — AB (ref 7.0–26.0)
CALCIUM: 8.8 mg/dL (ref 8.4–10.4)
CO2: 22 mEq/L (ref 22–29)
CREATININE: 1.7 mg/dL — AB (ref 0.6–1.1)
Chloride: 114 mEq/L — ABNORMAL HIGH (ref 98–109)
EGFR: 34 mL/min/{1.73_m2} — ABNORMAL LOW (ref >90)
Glucose: 142 mg/dl — ABNORMAL HIGH (ref 70–140)
Potassium: 4.3 mEq/L (ref 3.5–5.1)
Sodium: 143 mEq/L (ref 136–145)
Total Bilirubin: 1.08 mg/dL (ref 0.20–1.20)
Total Protein: 7.2 g/dL (ref 6.4–8.3)

## 2016-01-25 LAB — IRON AND TIBC
%SAT: 32 % (ref 21–57)
Iron: 75 ug/dL (ref 41–142)
TIBC: 233 ug/dL — ABNORMAL LOW (ref 236–444)
UIBC: 157 ug/dL (ref 120–384)

## 2016-01-25 LAB — FERRITIN

## 2016-01-25 MED ORDER — DARBEPOETIN ALFA 300 MCG/0.6ML IJ SOSY
300.0000 ug | PREFILLED_SYRINGE | Freq: Once | INTRAMUSCULAR | Status: AC
  Administered 2016-01-25: 300 ug via SUBCUTANEOUS

## 2016-01-25 MED ORDER — DARBEPOETIN ALFA 300 MCG/0.6ML IJ SOSY
PREFILLED_SYRINGE | INTRAMUSCULAR | Status: AC
  Filled 2016-01-25: qty 0.6

## 2016-01-25 NOTE — Progress Notes (Signed)
Hematology and Oncology Follow Up Visit  Rachel Palmer [id number] October 02, 1940 75 y.o. 01/25/2016   Principle Diagnosis:  1. Sickle cell disease 2. 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 Chronic splenomegaly 4. Chronic thrombocytopenia   Current Therapy:   Aranesp 300 mcg for Hct < 10 IV iron as indicated - last received in Feb 2017    Interim History:  Rachel Palmer is here today for a follow-up. She is having some joint pain which she attributes to the change in the weather and rain. She has had some SOB with exertion that passes with taking some time to rest.  She has not experienced a pain crisis since March.  No fever, chills, n/v, cough, rash, dizziness, chest pain or changes in her bowel or bladder habits.  No lymphadenopathy found on exam. No episodes of bleeding or bruising.  No numbness or tingling in her extremities at this time. No syncopal episodes or falls.  She is eating healthy and staying well hydrated. Her weight is stable.    Medications:    Medication List       This list is accurate as of: 01/25/16  1:04 PM.  Always use your most recent med list.               albuterol 108 (90 Base) MCG/ACT inhaler  Commonly known as:  PROVENTIL HFA;VENTOLIN HFA  Inhale 2 puffs into the lungs every 6 (six) hours as needed for wheezing or shortness of breath.     cetirizine 10 MG tablet  Commonly known as:  ZYRTEC  Take 10 mg by mouth daily.     ergocalciferol 50000 units capsule  Commonly known as:  VITAMIN D2  Take 50,000 Units by mouth once a week. Tuesday     Fluticasone-Salmeterol 500-50 MCG/DOSE Aepb  Commonly known as:  ADVAIR  Inhale 1 puff into the lungs 2 (two) times daily.     folic acid 1 MG tablet  Commonly known as:  FOLVITE  Take 2 tablets (2 mg total) by mouth daily.     furosemide 20 MG tablet  Commonly known as:  LASIX  Take 20 mg by mouth as needed for fluid.     hydrochlorothiazide 25 MG tablet  Commonly known as:  HYDRODIURIL  Take  25 mg by mouth daily.     insulin NPH-regular Human (70-30) 100 UNIT/ML injection  Commonly known as:  NOVOLIN 70/30  Inject into the skin.     metoprolol succinate 100 MG 24 hr tablet  Commonly known as:  TOPROL-XL  Take 100 mg by mouth daily. Take with or immediately following a meal.     montelukast 10 MG tablet  Commonly known as:  SINGULAIR  Take 10 mg by mouth every morning.     multivitamin with minerals Tabs tablet  Take 1 tablet by mouth daily.     omeprazole 40 MG capsule  Commonly known as:  PRILOSEC  Take 40 mg by mouth daily.     oxycodone 5 MG capsule  Commonly known as:  OXY-IR  Take 1 capsule (5 mg total) by mouth every 4 (four) hours as needed for pain.     PRESCRIPTION MEDICATION  Supportive Therapy [facility]     sertraline 100 MG tablet  Commonly known as:  ZOLOFT  Take 100 mg by mouth.        Allergies:  Allergies  Allergen Reactions  . Keflex [Cephalexin] Hives  . Codeine Hives  . Heparin Other (See  Comments)    Other Reaction: thrombocytopenia  . Heparin [facility]  . Iohexol Hives    Synthetic xray dye=iohexol   . Ioxaglate Hives  . Ivp Dye [Iodinated Diagnostic Agents] Hives  . Propoxyphene Other (See Comments)    Loopy  . Tape Rash    Skin peels Skin peels    Past Medical History, Surgical history, Social history, and Family History were reviewed and updated.  Review of Systems: All other 10 point review of systems is negative.   Physical Exam:  height is 5\' 5"  (1.651 m) and weight is 193 lb (87.544 kg). Her oral temperature is 97.5 F (36.4 C). Her blood pressure is 148/67 and her pulse is 74. Her respiration is 18.   Wt Readings from Last 3 Encounters:  01/25/16 193 lb (87.544 kg)  12/17/15 189 lb (85.73 kg)  11/24/15 194 lb 10.7 oz (88.3 kg)    Ocular: Sclerae unicteric, pupils equal, round and reactive to light Ear-nose-throat: Oropharynx clear, dentition fair Lymphatic: No cervical supraclavicular or axillary  adenopathy Lungs no rales or rhonchi, good excursion bilaterally Heart regular rate and rhythm, no murmur appreciated Abd soft, right lower quadrant slightly tender, positive bowel sounds, no liver or spleen tip found on exam, no fluid wave MSK no focal spinal tenderness, no joint edema Neuro: non-focal, well-oriented, appropriate affect Breasts: Deferred  Lab Results  Component Value Date   WBC 6.9 01/25/2016   HGB 9.8* 01/25/2016   HCT 28.3* 01/25/2016   MCV 71* 01/25/2016   PLT 80* 01/25/2016   Lab Results  Component Value Date   FERRITIN 2,555* 12/17/2015   IRON 62 12/17/2015   TIBC 211* 12/17/2015   UIBC 150 12/17/2015   IRONPCTSAT 29 12/17/2015   Lab Results  Component Value Date   RETICCTPCT 8.7* 11/26/2015   RBC 3.97 01/25/2016   RETICCTABS 135.0 09/16/2015   No results found for: KPAFRELGTCHN, LAMBDASER, KAPLAMBRATIO No results found for: IGGSERUM, IGA, IGMSERUM No results found for: Odetta Pink, SPEI   Chemistry      Component Value Date/Time   NA 143 12/17/2015 1138   NA 136 11/26/2015 0515   NA 143 01/02/2013 1433   K 4.1 12/17/2015 1138   K 4.1 11/26/2015 0515   K 4.3 01/02/2013 1433   CL 110 11/26/2015 0515   CL 112* 01/02/2013 1433   CO2 22 12/17/2015 1138   CO2 19* 11/26/2015 0515   CO2 24 01/02/2013 1433   BUN 22.6 12/17/2015 1138   BUN 41* 11/26/2015 0515   BUN 21 01/02/2013 1433   CREATININE 1.8* 12/17/2015 1138   CREATININE 1.89* 11/26/2015 0515   CREATININE 1.5* 01/02/2013 1433      Component Value Date/Time   CALCIUM 8.8 12/17/2015 1138   CALCIUM 8.0* 11/26/2015 0515   CALCIUM 8.8 01/02/2013 1433   ALKPHOS 109 12/17/2015 1138   ALKPHOS 84 11/21/2015 0637   ALKPHOS 104* 08/23/2012 0855   AST 12 12/17/2015 1138   AST 24 11/21/2015 0637   AST 16 08/23/2012 0855   ALT 10 12/17/2015 1138   ALT 12* 11/21/2015 0637   ALT 17 08/23/2012 0855   BILITOT 0.98 12/17/2015 1138   BILITOT  1.2 11/21/2015 0637   BILITOT 0.80 08/23/2012 0855     Impression and Plan: Rachel Palmer is a very pleasant 75 yo African American female with multiple hematologic issues. She is doing fairly well but the recent rainy weather and change in temperature she feels  has caused her to have some joint pain. Her Hgb today is 9.8.  She will get Aranesp today while she is here.  We will see what her iron studies show and bring her back in next week for an infusion if needed.  We will plan to see her back in 2 months for labs and follow-up.   She will contact us with any questions or concerns. We can certainly see her sooner if need be.   Eliezer Bottom, NP 5/9/20171:04 PM

## 2016-01-25 NOTE — Patient Instructions (Signed)
Darbepoetin Alfa injection What is this medicine? DARBEPOETIN ALFA (dar be POE e tin AL fa) helps your body make more red blood cells. It is used to treat anemia caused by chronic kidney failure and chemotherapy. This medicine may be used for other purposes; ask your health care provider or pharmacist if you have questions. What should I tell my health care provider before I take this medicine? They need to know if you have any of these conditions: -blood clotting disorders or history of blood clots -cancer patient not on chemotherapy -cystic fibrosis -heart disease, such as angina, heart failure, or a history of a heart attack -hemoglobin level of 12 g/dL or greater -high blood pressure -low levels of folate, iron, or vitamin B12 -seizures -an unusual or allergic reaction to darbepoetin, erythropoietin, albumin, hamster proteins, latex, other medicines, foods, dyes, or preservatives -pregnant or trying to get pregnant -breast-feeding How should I use this medicine? This medicine is for injection into a vein or under the skin. It is usually given by a health care professional in a hospital or clinic setting. If you get this medicine at home, you will be taught how to prepare and give this medicine. Do not shake the solution before you withdraw a dose. Use exactly as directed. Take your medicine at regular intervals. Do not take your medicine more often than directed. It is important that you put your used needles and syringes in a special sharps container. Do not put them in a trash can. If you do not have a sharps container, call your pharmacist or healthcare provider to get one. Talk to your pediatrician regarding the use of this medicine in children. While this medicine may be used in children as young as 1 year for selected conditions, precautions do apply. Overdosage: If you think you have taken too much of this medicine contact a poison control center or emergency room at once. NOTE:  This medicine is only for you. Do not share this medicine with others. What if I miss a dose? If you miss a dose, take it as soon as you can. If it is almost time for your next dose, take only that dose. Do not take double or extra doses. What may interact with this medicine? Do not take this medicine with any of the following medications: -epoetin alfa This list may not describe all possible interactions. Give your health care provider a list of all the medicines, herbs, non-prescription drugs, or dietary supplements you use. Also tell them if you smoke, drink alcohol, or use illegal drugs. Some items may interact with your medicine. What should I watch for while using this medicine? Visit your prescriber or health care professional for regular checks on your progress and for the needed blood tests and blood pressure measurements. It is especially important for the doctor to make sure your hemoglobin level is in the desired range, to limit the risk of potential side effects and to give you the best benefit. Keep all appointments for any recommended tests. Check your blood pressure as directed. Ask your doctor what your blood pressure should be and when you should contact him or her. As your body makes more red blood cells, you may need to take iron, folic acid, or vitamin B supplements. Ask your doctor or health care provider which products are right for you. If you have kidney disease continue dietary restrictions, even though this medication can make you feel better. Talk with your doctor or health care professional about the   foods you eat and the vitamins that you take. What side effects may I notice from receiving this medicine? Side effects that you should report to your doctor or health care professional as soon as possible: -allergic reactions like skin rash, itching or hives, swelling of the face, lips, or tongue -breathing problems -changes in vision -chest pain -confusion, trouble speaking  or understanding -feeling faint or lightheaded, falls -high blood pressure -muscle aches or pains -pain, swelling, warmth in the leg -rapid weight gain -severe headaches -sudden numbness or weakness of the face, arm or leg -trouble walking, dizziness, loss of balance or coordination -seizures (convulsions) -swelling of the ankles, feet, hands -unusually weak or tired Side effects that usually do not require medical attention (report to your doctor or health care professional if they continue or are bothersome): -diarrhea -fever, chills (flu-like symptoms) -headaches -nausea, vomiting -redness, stinging, or swelling at site where injected This list may not describe all possible side effects. Call your doctor for medical advice about side effects. You may report side effects to FDA at 1-800-FDA-1088. Where should I keep my medicine? Keep out of the reach of children. Store in a refrigerator between 2 and 8 degrees C (36 and 46 degrees F). Do not freeze. Do not shake. Throw away any unused portion if using a single-dose vial. Throw away any unused medicine after the expiration date. NOTE: This sheet is a summary. It may not cover all possible information. If you have questions about this medicine, talk to your doctor, pharmacist, or health care provider.    2016, Elsevier/Gold Standard. (2008-08-18 10:23:57)  

## 2016-01-26 LAB — RETICULOCYTES: Reticulocyte Count: 4.8 % — ABNORMAL HIGH (ref 0.6–2.6)

## 2016-03-03 ENCOUNTER — Telehealth: Payer: Self-pay | Admitting: *Deleted

## 2016-03-03 ENCOUNTER — Ambulatory Visit (HOSPITAL_BASED_OUTPATIENT_CLINIC_OR_DEPARTMENT_OTHER): Payer: Medicare Other | Admitting: Family

## 2016-03-03 ENCOUNTER — Ambulatory Visit (HOSPITAL_BASED_OUTPATIENT_CLINIC_OR_DEPARTMENT_OTHER)
Admission: RE | Admit: 2016-03-03 | Discharge: 2016-03-03 | Disposition: A | Discharge status: Home / Self Care | Payer: Medicare Other | Source: Ambulatory Visit | Attending: Family | Admitting: Family

## 2016-03-03 ENCOUNTER — Encounter: Payer: Self-pay | Admitting: Family

## 2016-03-03 ENCOUNTER — Ambulatory Visit (HOSPITAL_BASED_OUTPATIENT_CLINIC_OR_DEPARTMENT_OTHER): Payer: Medicare Other

## 2016-03-03 ENCOUNTER — Ambulatory Visit (HOSPITAL_COMMUNITY)
Admission: RE | Admit: 2016-03-03 | Discharge: 2016-03-03 | Disposition: A | Discharge status: Home / Self Care | Payer: Medicare Other | Source: Ambulatory Visit | Attending: Hematology & Oncology | Admitting: Hematology & Oncology

## 2016-03-03 ENCOUNTER — Other Ambulatory Visit (HOSPITAL_BASED_OUTPATIENT_CLINIC_OR_DEPARTMENT_OTHER): Payer: Medicare Other

## 2016-03-03 VITALS — BP 129/52 | HR 87 | Temp 98.3°F | Resp 16 | Ht 65.0 in | Wt 192.0 lb

## 2016-03-03 DIAGNOSIS — D732 Chronic congestive splenomegaly: Secondary | ICD-10-CM

## 2016-03-03 DIAGNOSIS — N184 Chronic kidney disease, stage 4 (severe): Secondary | ICD-10-CM

## 2016-03-03 DIAGNOSIS — M79605 Pain in left leg: Secondary | ICD-10-CM | POA: Insufficient documentation

## 2016-03-03 DIAGNOSIS — D631 Anemia in chronic kidney disease: Secondary | ICD-10-CM | POA: Insufficient documentation

## 2016-03-03 DIAGNOSIS — D571 Sickle-cell disease without crisis: Secondary | ICD-10-CM | POA: Diagnosis not present

## 2016-03-03 DIAGNOSIS — D508 Other iron deficiency anemias: Secondary | ICD-10-CM | POA: Diagnosis not present

## 2016-03-03 DIAGNOSIS — D57219 Sickle-cell/Hb-C disease with crisis, unspecified: Secondary | ICD-10-CM | POA: Insufficient documentation

## 2016-03-03 DIAGNOSIS — D696 Thrombocytopenia, unspecified: Secondary | ICD-10-CM

## 2016-03-03 DIAGNOSIS — N189 Chronic kidney disease, unspecified: Principal | ICD-10-CM

## 2016-03-03 LAB — IRON AND TIBC
%SAT: 38 % (ref 21–57)
IRON: 84 ug/dL (ref 41–142)
TIBC: 221 ug/dL — AB (ref 236–444)
UIBC: 137 ug/dL (ref 120–384)

## 2016-03-03 LAB — CBC WITH DIFFERENTIAL (CANCER CENTER ONLY)
BASO#: 0 10*3/uL (ref 0.0–0.2)
BASO%: 0.4 % (ref 0.0–2.0)
EOS%: 1.9 % (ref 0.0–7.0)
Eosinophils Absolute: 0.2 10*3/uL (ref 0.0–0.5)
HEMATOCRIT: 19.4 % — AB (ref 34.8–46.6)
HGB: 6.4 g/dL — CL (ref 11.6–15.9)
LYMPH#: 2.7 10*3/uL (ref 0.9–3.3)
LYMPH%: 28.9 % (ref 14.0–48.0)
MCH: 24.1 pg — ABNORMAL LOW (ref 26.0–34.0)
MCHC: 33 g/dL (ref 32.0–36.0)
MCV: 73 fL — ABNORMAL LOW (ref 81–101)
MONO#: 0.7 10*3/uL (ref 0.1–0.9)
MONO%: 6.9 % (ref 0.0–13.0)
NEUT#: 5.8 10*3/uL (ref 1.5–6.5)
NEUT%: 61.9 % (ref 39.6–80.0)
PLATELETS: 56 10*3/uL — AB (ref 145–400)
RBC: 2.66 10*6/uL — ABNORMAL LOW (ref 3.70–5.32)
RDW: 22.4 % — AB (ref 11.1–15.7)
WBC: 9.4 10*3/uL (ref 3.9–10.0)

## 2016-03-03 LAB — COMPREHENSIVE METABOLIC PANEL
ALBUMIN: 3.9 g/dL (ref 3.5–5.0)
ALK PHOS: 90 U/L (ref 40–150)
ALT: 11 U/L (ref 0–55)
AST: 15 U/L (ref 5–34)
Anion Gap: 10 mEq/L (ref 3–11)
BILIRUBIN TOTAL: 1.61 mg/dL — AB (ref 0.20–1.20)
BUN: 28 mg/dL — AB (ref 7.0–26.0)
CALCIUM: 8.5 mg/dL (ref 8.4–10.4)
CO2: 18 mEq/L — ABNORMAL LOW (ref 22–29)
Chloride: 116 mEq/L — ABNORMAL HIGH (ref 98–109)
Creatinine: 1.8 mg/dL — ABNORMAL HIGH (ref 0.6–1.1)
EGFR: 32 mL/min/{1.73_m2} — AB (ref >90)
GLUCOSE: 133 mg/dL (ref 70–140)
POTASSIUM: 3.9 meq/L (ref 3.5–5.1)
SODIUM: 143 meq/L (ref 136–145)
TOTAL PROTEIN: 7.2 g/dL (ref 6.4–8.3)

## 2016-03-03 LAB — TECHNOLOGIST REVIEW CHCC SATELLITE

## 2016-03-03 LAB — FERRITIN

## 2016-03-03 MED ORDER — DARBEPOETIN ALFA 300 MCG/0.6ML IJ SOSY
PREFILLED_SYRINGE | INTRAMUSCULAR | Status: AC
  Filled 2016-03-03: qty 0.6

## 2016-03-03 MED ORDER — DARBEPOETIN ALFA 300 MCG/0.6ML IJ SOSY
300.0000 ug | PREFILLED_SYRINGE | Freq: Once | INTRAMUSCULAR | Status: AC
  Administered 2016-03-03: 300 ug via SUBCUTANEOUS

## 2016-03-03 NOTE — Telephone Encounter (Signed)
Critical Value Hgb 6.4 Laverna Peace NP notified. No orders at this time.

## 2016-03-03 NOTE — Patient Instructions (Signed)
Darbepoetin Alfa injection What is this medicine? DARBEPOETIN ALFA (dar be POE e tin AL fa) helps your body make more red blood cells. It is used to treat anemia caused by chronic kidney failure and chemotherapy. This medicine may be used for other purposes; ask your health care provider or pharmacist if you have questions. What should I tell my health care provider before I take this medicine? They need to know if you have any of these conditions: -blood clotting disorders or history of blood clots -cancer patient not on chemotherapy -cystic fibrosis -heart disease, such as angina, heart failure, or a history of a heart attack -hemoglobin level of 12 g/dL or greater -high blood pressure -low levels of folate, iron, or vitamin B12 -seizures -an unusual or allergic reaction to darbepoetin, erythropoietin, albumin, hamster proteins, latex, other medicines, foods, dyes, or preservatives -pregnant or trying to get pregnant -breast-feeding How should I use this medicine? This medicine is for injection into a vein or under the skin. It is usually given by a health care professional in a hospital or clinic setting. If you get this medicine at home, you will be taught how to prepare and give this medicine. Do not shake the solution before you withdraw a dose. Use exactly as directed. Take your medicine at regular intervals. Do not take your medicine more often than directed. It is important that you put your used needles and syringes in a special sharps container. Do not put them in a trash can. If you do not have a sharps container, call your pharmacist or healthcare provider to get one. Talk to your pediatrician regarding the use of this medicine in children. While this medicine may be used in children as young as 1 year for selected conditions, precautions do apply. Overdosage: If you think you have taken too much of this medicine contact a poison control center or emergency room at once. NOTE:  This medicine is only for you. Do not share this medicine with others. What if I miss a dose? If you miss a dose, take it as soon as you can. If it is almost time for your next dose, take only that dose. Do not take double or extra doses. What may interact with this medicine? Do not take this medicine with any of the following medications: -epoetin alfa This list may not describe all possible interactions. Give your health care provider a list of all the medicines, herbs, non-prescription drugs, or dietary supplements you use. Also tell them if you smoke, drink alcohol, or use illegal drugs. Some items may interact with your medicine. What should I watch for while using this medicine? Visit your prescriber or health care professional for regular checks on your progress and for the needed blood tests and blood pressure measurements. It is especially important for the doctor to make sure your hemoglobin level is in the desired range, to limit the risk of potential side effects and to give you the best benefit. Keep all appointments for any recommended tests. Check your blood pressure as directed. Ask your doctor what your blood pressure should be and when you should contact him or her. As your body makes more red blood cells, you may need to take iron, folic acid, or vitamin B supplements. Ask your doctor or health care provider which products are right for you. If you have kidney disease continue dietary restrictions, even though this medication can make you feel better. Talk with your doctor or health care professional about the   foods you eat and the vitamins that you take. What side effects may I notice from receiving this medicine? Side effects that you should report to your doctor or health care professional as soon as possible: -allergic reactions like skin rash, itching or hives, swelling of the face, lips, or tongue -breathing problems -changes in vision -chest pain -confusion, trouble speaking  or understanding -feeling faint or lightheaded, falls -high blood pressure -muscle aches or pains -pain, swelling, warmth in the leg -rapid weight gain -severe headaches -sudden numbness or weakness of the face, arm or leg -trouble walking, dizziness, loss of balance or coordination -seizures (convulsions) -swelling of the ankles, feet, hands -unusually weak or tired Side effects that usually do not require medical attention (report to your doctor or health care professional if they continue or are bothersome): -diarrhea -fever, chills (flu-like symptoms) -headaches -nausea, vomiting -redness, stinging, or swelling at site where injected This list may not describe all possible side effects. Call your doctor for medical advice about side effects. You may report side effects to FDA at 1-800-FDA-1088. Where should I keep my medicine? Keep out of the reach of children. Store in a refrigerator between 2 and 8 degrees C (36 and 46 degrees F). Do not freeze. Do not shake. Throw away any unused portion if using a single-dose vial. Throw away any unused medicine after the expiration date. NOTE: This sheet is a summary. It may not cover all possible information. If you have questions about this medicine, talk to your doctor, pharmacist, or health care provider.    2016, Elsevier/Gold Standard. (2008-08-18 10:23:57)  

## 2016-03-03 NOTE — Progress Notes (Signed)
Hematology and Oncology Follow Up Visit  Rachel Palmer [id number] 04/05/1941 75 y.o. 03/03/2016   Principle Diagnosis:  1. Sickle cell disease 2. 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 Chronic splenomegaly 4. Chronic thrombocytopenia   Current Therapy:   Aranesp 300 mcg for Hct < 10 IV iron as indicated - last received in Feb 2017    Interim History:  Rachel Palmer is here today for a follow-up. She is feeling quite tired and having palpitations and SOB with any exertion. She also c/o pain and cramping in her left leg and some intermittent ankle swelling. We will get an US of the left lower extremity to rule out a blood clot before administering Aranesp.  Her Hgb today is 6.4 with an MCv of 73. Platelet count is 56. She denies having any episodes of bleeding, bruising or petechiae.  No fever, chills, n/v, cough, rash, dizziness, chest pain or changes in her bowel or bladder habits.  No lymphadenopathy found on exam.  No numbness or tingling in her extremities at this time. No syncopal episodes or falls.  She has maintained a good appetite and is staying well hydrated. Her weight is stable.   Medications:    Medication List       This list is accurate as of: 03/03/16 10:11 AM.  Always use your most recent med list.               albuterol 108 (90 Base) MCG/ACT inhaler  Commonly known as:  PROVENTIL HFA;VENTOLIN HFA  Inhale 2 puffs into the lungs every 6 (six) hours as needed for wheezing or shortness of breath.     cetirizine 10 MG tablet  Commonly known as:  ZYRTEC  Take 10 mg by mouth daily.     ergocalciferol 50000 units capsule  Commonly known as:  VITAMIN D2  Take 50,000 Units by mouth once a week. Tuesday     Fluticasone-Salmeterol 500-50 MCG/DOSE Aepb  Commonly known as:  ADVAIR  Inhale 1 puff into the lungs 2 (two) times daily.     folic acid 1 MG tablet  Commonly known as:  FOLVITE  Take 2 tablets (2 mg total) by mouth daily.     furosemide 20 MG  tablet  Commonly known as:  LASIX  Take 20 mg by mouth as needed for fluid.     hydrochlorothiazide 25 MG tablet  Commonly known as:  HYDRODIURIL  Take 25 mg by mouth daily.     insulin NPH-regular Human (70-30) 100 UNIT/ML injection  Commonly known as:  NOVOLIN 70/30  Inject into the skin.     metoprolol succinate 100 MG 24 hr tablet  Commonly known as:  TOPROL-XL  Take 100 mg by mouth daily. Take with or immediately following a meal.     montelukast 10 MG tablet  Commonly known as:  SINGULAIR  Take 10 mg by mouth every morning.     multivitamin with minerals Tabs tablet  Take 1 tablet by mouth daily.     omeprazole 40 MG capsule  Commonly known as:  PRILOSEC  Take 40 mg by mouth daily.     oxycodone 5 MG capsule  Commonly known as:  OXY-IR  Take 1 capsule (5 mg total) by mouth every 4 (four) hours as needed for pain.     PRESCRIPTION MEDICATION  Supportive Therapy [facility]     sertraline 100 MG tablet  Commonly known as:  ZOLOFT  Take 100 mg by mouth.  Allergies:  Allergies  Allergen Reactions  . Keflex [Cephalexin] Hives  . Codeine Hives  . Heparin Other (See Comments)    Other Reaction: thrombocytopenia  . Heparin [facility]  . Iohexol Hives    Synthetic xray dye=iohexol   . Ioxaglate Hives  . Ivp Dye [Iodinated Diagnostic Agents] Hives  . Propoxyphene Other (See Comments)    Loopy  . Tape Rash    Skin peels Skin peels    Past Medical History, Surgical history, Social history, and Family History were reviewed and updated.  Review of Systems: All other 10 point review of systems is negative.   Physical Exam:  vitals were not taken for this visit.  Wt Readings from Last 3 Encounters:  01/25/16 193 lb (87.544 kg)  12/17/15 189 lb (85.73 kg)  11/24/15 194 lb 10.7 oz (88.3 kg)    Ocular: Sclerae unicteric, pupils equal, round and reactive to light Ear-nose-throat: Oropharynx clear, dentition fair Lymphatic: No cervical  supraclavicular or axillary adenopathy Lungs no rales or rhonchi, good excursion bilaterally Heart regular rate and rhythm, no murmur appreciated Abd soft, right lower quadrant slightly tender, positive bowel sounds, no liver or spleen tip found on exam, no fluid wave MSK no focal spinal tenderness, no joint edema Neuro: non-focal, well-oriented, appropriate affect Breasts: Deferred  Lab Results  Component Value Date   WBC 9.4 03/03/2016   HGB 6.4 Repeated and Verified* 03/03/2016   HCT 19.4* 03/03/2016   MCV 73* 03/03/2016   PLT 56* 03/03/2016   Lab Results  Component Value Date   FERRITIN 2,008 Result Confirmed by Automated Dilution.* 01/25/2016   IRON 75 01/25/2016   TIBC 233* 01/25/2016   UIBC 157 01/25/2016   IRONPCTSAT 32 01/25/2016   Lab Results  Component Value Date   RETICCTPCT 8.7* 11/26/2015   RBC 2.66* 03/03/2016   RETICCTABS 135.0 09/16/2015   No results found for: KPAFRELGTCHN, LAMBDASER, KAPLAMBRATIO No results found for: IGGSERUM, IGA, IGMSERUM No results found for: Odetta Pink, SPEI   Chemistry      Component Value Date/Time   NA 143 01/25/2016 1041   NA 136 11/26/2015 0515   NA 143 01/02/2013 1433   K 4.3 01/25/2016 1041   K 4.1 11/26/2015 0515   K 4.3 01/02/2013 1433   CL 110 11/26/2015 0515   CL 112* 01/02/2013 1433   CO2 22 01/25/2016 1041   CO2 19* 11/26/2015 0515   CO2 24 01/02/2013 1433   BUN 31.3* 01/25/2016 1041   BUN 41* 11/26/2015 0515   BUN 21 01/02/2013 1433   CREATININE 1.7* 01/25/2016 1041   CREATININE 1.89* 11/26/2015 0515   CREATININE 1.5* 01/02/2013 1433      Component Value Date/Time   CALCIUM 8.8 01/25/2016 1041   CALCIUM 8.0* 11/26/2015 0515   CALCIUM 8.8 01/02/2013 1433   ALKPHOS 84 01/25/2016 1041   ALKPHOS 84 11/21/2015 0637   ALKPHOS 104* 08/23/2012 0855   AST 12 01/25/2016 1041   AST 24 11/21/2015 0637   AST 16 08/23/2012 0855   ALT 10 01/25/2016 1041    ALT 12* 11/21/2015 0637   ALT 17 08/23/2012 0855   BILITOT 1.08 01/25/2016 1041   BILITOT 1.2 11/21/2015 0637   BILITOT 0.80 08/23/2012 0855     Impression and Plan: Rachel Palmer is a very pleasant 75 yo African American female with multiple hematologic issues. She is symptomatic with fatigue, palpitations and SOB. Her Hgb is 6.4 with no episodes of bleeding.  We will type and cross her and plan to give her 2 units PRBC's on Monday.  [place] of left leg showed no evidence of DVT of the left leg. We will proceed with her Aranesp injection.  We will plan to see her back in 1 month for labs and follow-up.   She will contact us with any questions or concerns. We can certainly see her sooner if need be.   Eliezer Bottom, NP 6/16/201710:11 AM

## 2016-03-04 LAB — RETICULOCYTES: Reticulocyte Count: 9.3 % — ABNORMAL HIGH (ref 0.6–2.6)

## 2016-03-06 ENCOUNTER — Other Ambulatory Visit: Payer: Self-pay | Admitting: *Deleted

## 2016-03-06 ENCOUNTER — Encounter: Payer: Self-pay | Admitting: *Deleted

## 2016-03-06 ENCOUNTER — Other Ambulatory Visit: Payer: Medicare Other

## 2016-03-06 DIAGNOSIS — N184 Chronic kidney disease, stage 4 (severe): Principal | ICD-10-CM

## 2016-03-06 DIAGNOSIS — D631 Anemia in chronic kidney disease: Secondary | ICD-10-CM

## 2016-03-06 DIAGNOSIS — D508 Other iron deficiency anemias: Secondary | ICD-10-CM | POA: Diagnosis not present

## 2016-03-06 DIAGNOSIS — D57219 Sickle-cell/Hb-C disease with crisis, unspecified: Secondary | ICD-10-CM | POA: Diagnosis not present

## 2016-03-06 LAB — TYPE AND SCREEN
ABO/RH(D): O POS
ANTIBODY SCREEN: POSITIVE

## 2016-03-06 LAB — PREPARE RBC (CROSSMATCH)

## 2016-03-07 ENCOUNTER — Other Ambulatory Visit: Payer: Self-pay | Admitting: Emergency Medicine

## 2016-03-07 ENCOUNTER — Ambulatory Visit (HOSPITAL_BASED_OUTPATIENT_CLINIC_OR_DEPARTMENT_OTHER): Payer: Medicare Other

## 2016-03-07 VITALS — BP 154/56 | HR 62 | Temp 98.3°F | Resp 16

## 2016-03-07 DIAGNOSIS — N184 Chronic kidney disease, stage 4 (severe): Secondary | ICD-10-CM

## 2016-03-07 DIAGNOSIS — D631 Anemia in chronic kidney disease: Secondary | ICD-10-CM

## 2016-03-07 DIAGNOSIS — D508 Other iron deficiency anemias: Secondary | ICD-10-CM

## 2016-03-07 DIAGNOSIS — D571 Sickle-cell disease without crisis: Secondary | ICD-10-CM | POA: Diagnosis not present

## 2016-03-07 DIAGNOSIS — D57219 Sickle-cell/Hb-C disease with crisis, unspecified: Secondary | ICD-10-CM

## 2016-03-07 MED ORDER — ACETAMINOPHEN 325 MG PO TABS
650.0000 mg | ORAL_TABLET | Freq: Once | ORAL | Status: AC
  Administered 2016-03-07: 650 mg via ORAL

## 2016-03-07 MED ORDER — DIPHENHYDRAMINE HCL 25 MG PO CAPS
ORAL_CAPSULE | ORAL | Status: AC
  Filled 2016-03-07: qty 1

## 2016-03-07 MED ORDER — SODIUM CHLORIDE 0.9 % IV SOLN
250.0000 mL | Freq: Once | INTRAVENOUS | Status: AC
  Administered 2016-03-07: 250 mL via INTRAVENOUS

## 2016-03-07 MED ORDER — ACETAMINOPHEN 325 MG PO TABS
ORAL_TABLET | ORAL | Status: AC
  Filled 2016-03-07: qty 2

## 2016-03-07 MED ORDER — DIPHENHYDRAMINE HCL 25 MG PO CAPS
25.0000 mg | ORAL_CAPSULE | Freq: Once | ORAL | Status: AC
  Administered 2016-03-07: 25 mg via ORAL

## 2016-03-07 NOTE — Patient Instructions (Signed)

## 2016-03-08 ENCOUNTER — Encounter: Payer: Self-pay | Admitting: Hematology & Oncology

## 2016-03-08 LAB — TYPE AND SCREEN
ABO/RH(D): O POS
ANTIBODY SCREEN: POSITIVE
DAT, IgG: POSITIVE
Unit division: 0
Unit division: 0

## 2016-03-11 NOTE — Progress Notes (Signed)
This encounter was created in error - please disregard.

## 2016-03-11 NOTE — Progress Notes (Signed)
Hematology and Oncology Follow Up Visit  Rachel Palmer [id number] Dec 07, 1940 75 y.o. 03/11/2016   Principle Diagnosis:  1. Sickle cell disease 2. 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 Chronic splenomegaly 4. Chronic thrombocytopenia   Current Therapy:   Procrit 40,000 units for Hgb < 10    Interim History:  Rachel Palmer is here today for a follow-up. She was hospitalized earlier this month with anemia with a Hgb of 5.7. She received blood, iron and procrit during admission. Her Hgb today is now up to 9.7 with an MCV of 72.  She did have a bone marrow biopsy while in the hospital and cytogenetics were normal.  She states that she is currently staying in a skilled nursing facility.  She has had no episodes of bleeding.  She is still having some fatigue and days where she feels "bad." She has SOB when walking a good distance. She is able to ambulate with a cane at home and denies having had any falls. She is in a wheelchair today because she states that it was too far for her to ambulate without getting tired.  No fever, chills, n/v, cough, rash, dizziness, SOB, chest pain, palpitations or changes in her bowel or bladder habits.  Splenomegaly on 07/30/15 abdominal US was slightly improved. This scan also showed hepatic steatosis. She has some mild tenderness on palpation of the right upper quadrant.  She states that she occasionally has a "mini" sickle cell pain crisis. She manages this from home with pain medication, rest and fluids. She has not been hospitalized for a sickle cell crisis in over 2 years. No swelling in her extremities.  She has a healthy appetite and is staying well hydrated. Her weight is stable.   She does not check her blood sugars regularly but her Hgb A1c earlier this month was 5.6.   Medications:    Medication List       This list is accurate as of: 09/01/15 11:59 PM.  Always use your most recent med list.               albuterol 108 (90 Base) MCG/ACT  inhaler  Commonly known as:  PROVENTIL HFA;VENTOLIN HFA  Inhale 2 puffs into the lungs every 6 (six) hours as needed for wheezing or shortness of breath.     allopurinol 100 MG tablet  Commonly known as:  ZYLOPRIM  Take 100 mg by mouth every other day.     cetirizine 10 MG tablet  Commonly known as:  ZYRTEC  Take 10 mg by mouth daily.     ergocalciferol 50000 units capsule  Commonly known as:  VITAMIN D2  Take 50,000 Units by mouth once a week. Tuesday     Fluticasone-Salmeterol 500-50 MCG/DOSE Aepb  Commonly known as:  ADVAIR  Inhale 1 puff into the lungs 2 (two) times daily.     folic acid 1 MG tablet  Commonly known as:  FOLVITE  Take 2 tablets (2 mg total) by mouth daily.     furosemide 20 MG tablet  Commonly known as:  LASIX  Take 20 mg by mouth as needed for fluid.     insulin aspart protamine- aspart (70-30) 100 UNIT/ML injection  Commonly known as:  NOVOLOG MIX 70/30  Inject 35-38 Units into the skin 2 (two) times daily with a meal. 35 in the morning and 38 in the evening     montelukast 10 MG tablet  Commonly known as:  SINGULAIR  Take  10 mg by mouth every morning.     multivitamin with minerals Tabs tablet  Take 1 tablet by mouth daily.     omeprazole 40 MG capsule  Commonly known as:  PRILOSEC  Take 40 mg by mouth daily.     oxycodone 5 MG capsule  Commonly known as:  OXY-IR  Take 1 capsule (5 mg total) by mouth every 4 (four) hours as needed for pain.     sertraline 100 MG tablet  Commonly known as:  ZOLOFT  Take 100 mg by mouth daily.     TOPROL XL PO  Take 100 mg by mouth daily.        Allergies:  Allergies  Allergen Reactions  . Keflex [Cephalexin] Hives  . Codeine Hives  . Heparin Other (See Comments)    Other Reaction: thrombocytopenia  . Heparin [facility]  . Iohexol Hives    Synthetic xray dye=iohexol   . Ioxaglate Hives  . Ivp Dye [Iodinated Diagnostic Agents] Hives  . Propoxyphene Other (See Comments)    Loopy  .  Tape Rash    Skin peels Skin peels    Past Medical History, Surgical history, Social history, and Family History were reviewed and updated.  Review of Systems: All other 10 point review of systems is negative.   Physical Exam:  weight is 197 lb (89.359 kg). Her oral temperature is 98 F (36.7 C). Her blood pressure is 147/77 and her pulse is 103. Her respiration is 18.   Wt Readings from Last 3 Encounters:  03/03/16 192 lb (87.091 kg)  01/25/16 193 lb (87.544 kg)  12/17/15 189 lb (85.73 kg)    Ocular: Sclerae unicteric, pupils equal, round and reactive to light Ear-nose-throat: Oropharynx clear, dentition fair Lymphatic: No cervical supraclavicular or axillary adenopathy Lungs no rales or rhonchi, good excursion bilaterally Heart regular rate and rhythm, no murmur appreciated Abd soft, nontender, positive bowel sounds MSK no focal spinal tenderness, no joint edema Neuro: non-focal, well-oriented, appropriate affect Breasts: Deferred  Lab Results  Component Value Date   WBC 9.4 03/03/2016   HGB 6.4 Repeated and Verified* 03/03/2016   HCT 19.4* 03/03/2016   MCV 73* 03/03/2016   PLT 56* 03/03/2016   Lab Results  Component Value Date   FERRITIN 1,630* 03/03/2016   IRON 84 03/03/2016   TIBC 221* 03/03/2016   UIBC 137 03/03/2016   IRONPCTSAT 38 03/03/2016   Lab Results  Component Value Date   RETICCTPCT 8.7* 11/26/2015   RBC 2.66* 03/03/2016   RETICCTABS 135.0 09/16/2015   No results found for: KPAFRELGTCHN, LAMBDASER, KAPLAMBRATIO No results found for: IGGSERUM, IGA, IGMSERUM No results found for: Odetta Pink, SPEI   Chemistry      Component Value Date/Time   NA 143 03/03/2016 0948   NA 136 11/26/2015 0515   NA 143 01/02/2013 1433   K 3.9 03/03/2016 0948   K 4.1 11/26/2015 0515   K 4.3 01/02/2013 1433   CL 110 11/26/2015 0515   CL 112* 01/02/2013 1433   CO2 18* 03/03/2016 0948   CO2 19* 11/26/2015  0515   CO2 24 01/02/2013 1433   BUN 28.0* 03/03/2016 0948   BUN 41* 11/26/2015 0515   BUN 21 01/02/2013 1433   CREATININE 1.8* 03/03/2016 0948   CREATININE 1.89* 11/26/2015 0515   CREATININE 1.5* 01/02/2013 1433      Component Value Date/Time   CALCIUM 8.5 03/03/2016 0948   CALCIUM 8.0* 11/26/2015 0515  CALCIUM 8.8 01/02/2013 1433   ALKPHOS 90 03/03/2016 0948   ALKPHOS 84 11/21/2015 0637   ALKPHOS 104* 08/23/2012 0855   AST 15 03/03/2016 0948   AST 24 11/21/2015 0637   AST 16 08/23/2012 0855   ALT 11 03/03/2016 0948   ALT 12* 11/21/2015 0637   ALT 17 08/23/2012 0855   BILITOT 1.61* 03/03/2016 0948   BILITOT 1.2 11/21/2015 0637   BILITOT 0.80 08/23/2012 0855     Impression and Plan: Rachel Palmer is a very pleasant 75 yo African American female with multiple hematologic issues. She was hopsitalized earlier this month with anemia and received blood, feraheme and procrit. She does have sickle cell and stage IV renal failure.  Her bone marrow biopsy cytogenetics were normal.  Her Hgb is now up to 9.7 with an MCV of 72.  She has been able to deal with "mini" pain crisis with her sickle cell at home with pain meds, rest and fluids and has not required hospitalization for this in over 2 years.  We will continue to follow along with her closely and plan to see her back in 2 weeks.  She will get a dose of Procrit today.  She has chronically high ferritin. Her iron saturation today is 19. We will hold off on giving her feraheme.  She will contact us with any questions or concerns. We can certainly see her sooner if need be.   Volanda Napoleon, MD 6/24/201711:56 AM

## 2016-03-11 NOTE — Addendum Note (Signed)
Addended by: Burney Gauze R on: 03/11/2016 11:55 AM   Modules accepted: Miquel Dunn

## 2016-03-28 ENCOUNTER — Ambulatory Visit: Payer: Medicare Other | Admitting: Family

## 2016-03-28 ENCOUNTER — Ambulatory Visit: Payer: Medicare Other

## 2016-03-28 ENCOUNTER — Other Ambulatory Visit: Payer: Medicare Other

## 2016-04-07 ENCOUNTER — Ambulatory Visit: Payer: Medicare Other | Admitting: Family

## 2016-04-07 ENCOUNTER — Ambulatory Visit: Payer: Medicare Other

## 2016-04-07 ENCOUNTER — Other Ambulatory Visit: Payer: Medicare Other

## 2016-05-03 ENCOUNTER — Ambulatory Visit (HOSPITAL_BASED_OUTPATIENT_CLINIC_OR_DEPARTMENT_OTHER): Payer: Medicare Other | Admitting: Family

## 2016-05-03 ENCOUNTER — Ambulatory Visit (HOSPITAL_BASED_OUTPATIENT_CLINIC_OR_DEPARTMENT_OTHER): Payer: Medicare Other

## 2016-05-03 ENCOUNTER — Other Ambulatory Visit (HOSPITAL_BASED_OUTPATIENT_CLINIC_OR_DEPARTMENT_OTHER): Payer: Medicare Other

## 2016-05-03 VITALS — BP 132/104 | HR 96 | Temp 97.9°F | Resp 20

## 2016-05-03 DIAGNOSIS — D631 Anemia in chronic kidney disease: Secondary | ICD-10-CM | POA: Diagnosis not present

## 2016-05-03 DIAGNOSIS — N189 Chronic kidney disease, unspecified: Secondary | ICD-10-CM

## 2016-05-03 DIAGNOSIS — N184 Chronic kidney disease, stage 4 (severe): Secondary | ICD-10-CM

## 2016-05-03 DIAGNOSIS — D571 Sickle-cell disease without crisis: Secondary | ICD-10-CM

## 2016-05-03 DIAGNOSIS — M79605 Pain in left leg: Secondary | ICD-10-CM

## 2016-05-03 DIAGNOSIS — D508 Other iron deficiency anemias: Secondary | ICD-10-CM

## 2016-05-03 DIAGNOSIS — D696 Thrombocytopenia, unspecified: Secondary | ICD-10-CM

## 2016-05-03 DIAGNOSIS — D5720 Sickle-cell/Hb-C disease without crisis: Secondary | ICD-10-CM

## 2016-05-03 DIAGNOSIS — D57219 Sickle-cell/Hb-C disease with crisis, unspecified: Secondary | ICD-10-CM

## 2016-05-03 DIAGNOSIS — R161 Splenomegaly, not elsewhere classified: Secondary | ICD-10-CM | POA: Diagnosis not present

## 2016-05-03 LAB — COMPREHENSIVE METABOLIC PANEL (CC13)
A/G RATIO: 1.5 (ref 1.2–2.2)
ALBUMIN: 4.3 g/dL (ref 3.5–4.8)
ALK PHOS: 104 IU/L (ref 39–117)
ALT: 8 IU/L (ref 0–32)
AST (SGOT): 10 IU/L (ref 0–40)
BILIRUBIN TOTAL: 0.6 mg/dL (ref 0.0–1.2)
BUN/Creatinine Ratio: 10 — ABNORMAL LOW (ref 12–28)
BUN: 14 mg/dL (ref 8–27)
CHLORIDE: 109 mmol/L — AB (ref 96–106)
Calcium, Ser: 7.8 mg/dL — ABNORMAL LOW (ref 8.7–10.3)
Carbon Dioxide, Total: 22 mmol/L (ref 18–29)
Creatinine, Ser: 1.46 mg/dL — ABNORMAL HIGH (ref 0.57–1.00)
GFR calc Af Amer: 41 mL/min/{1.73_m2} — ABNORMAL LOW (ref >59)
GFR calc non Af Amer: 35 mL/min/{1.73_m2} — ABNORMAL LOW (ref >59)
GLOBULIN, TOTAL: 2.8 g/dL (ref 1.5–4.5)
GLUCOSE: 139 mg/dL — AB (ref 65–99)
POTASSIUM: 3.6 mmol/L (ref 3.5–5.2)
SODIUM: 141 mmol/L (ref 134–144)
Total Protein: 7.1 g/dL (ref 6.0–8.5)

## 2016-05-03 LAB — CBC WITH DIFFERENTIAL (CANCER CENTER ONLY)
BASO#: 0 10*3/uL (ref 0.0–0.2)
BASO%: 0.3 % (ref 0.0–2.0)
EOS ABS: 0.2 10*3/uL (ref 0.0–0.5)
EOS%: 2.3 % (ref 0.0–7.0)
HCT: 28.1 % — ABNORMAL LOW (ref 34.8–46.6)
HEMOGLOBIN: 9.8 g/dL — AB (ref 11.6–15.9)
LYMPH#: 2 10*3/uL (ref 0.9–3.3)
LYMPH%: 31.1 % (ref 14.0–48.0)
MCH: 24.1 pg — AB (ref 26.0–34.0)
MCHC: 34.9 g/dL (ref 32.0–36.0)
MCV: 69 fL — AB (ref 81–101)
MONO#: 0.4 10*3/uL (ref 0.1–0.9)
MONO%: 6.7 % (ref 0.0–13.0)
NEUT%: 59.6 % (ref 39.6–80.0)
NEUTROS ABS: 3.9 10*3/uL (ref 1.5–6.5)
Platelets: 79 10*3/uL — ABNORMAL LOW (ref 145–400)
RBC: 4.07 10*6/uL (ref 3.70–5.32)
RDW: 21.9 % — ABNORMAL HIGH (ref 11.1–15.7)
WBC: 6.5 10*3/uL (ref 3.9–10.0)

## 2016-05-03 MED ORDER — DARBEPOETIN ALFA 300 MCG/0.6ML IJ SOSY
PREFILLED_SYRINGE | INTRAMUSCULAR | Status: AC
  Filled 2016-05-03: qty 0.6

## 2016-05-03 MED ORDER — FOLIC ACID 1 MG PO TABS: 2.0000 mg | ORAL_TABLET | Freq: Every day | ORAL | 3 refills | Status: AC

## 2016-05-03 MED ORDER — DARBEPOETIN ALFA 300 MCG/0.6ML IJ SOSY
300.0000 ug | PREFILLED_SYRINGE | Freq: Once | INTRAMUSCULAR | Status: AC
  Administered 2016-05-03: 300 ug via SUBCUTANEOUS

## 2016-05-03 NOTE — Patient Instructions (Signed)
Darbepoetin Alfa injection What is this medicine? DARBEPOETIN ALFA (dar be POE e tin AL fa) helps your body make more red blood cells. It is used to treat anemia caused by chronic kidney failure and chemotherapy. This medicine may be used for other purposes; ask your health care provider or pharmacist if you have questions. What should I tell my health care provider before I take this medicine? They need to know if you have any of these conditions: -blood clotting disorders or history of blood clots -cancer patient not on chemotherapy -cystic fibrosis -heart disease, such as angina, heart failure, or a history of a heart attack -hemoglobin level of 12 g/dL or greater -high blood pressure -low levels of folate, iron, or vitamin B12 -seizures -an unusual or allergic reaction to darbepoetin, erythropoietin, albumin, hamster proteins, latex, other medicines, foods, dyes, or preservatives -pregnant or trying to get pregnant -breast-feeding How should I use this medicine? This medicine is for injection into a vein or under the skin. It is usually given by a health care professional in a hospital or clinic setting. If you get this medicine at home, you will be taught how to prepare and give this medicine. Do not shake the solution before you withdraw a dose. Use exactly as directed. Take your medicine at regular intervals. Do not take your medicine more often than directed. It is important that you put your used needles and syringes in a special sharps container. Do not put them in a trash can. If you do not have a sharps container, call your pharmacist or healthcare provider to get one. Talk to your pediatrician regarding the use of this medicine in children. While this medicine may be used in children as young as 1 year for selected conditions, precautions do apply. Overdosage: If you think you have taken too much of this medicine contact a poison control center or emergency room at once. NOTE:  This medicine is only for you. Do not share this medicine with others. What if I miss a dose? If you miss a dose, take it as soon as you can. If it is almost time for your next dose, take only that dose. Do not take double or extra doses. What may interact with this medicine? Do not take this medicine with any of the following medications: -epoetin alfa This list may not describe all possible interactions. Give your health care provider a list of all the medicines, herbs, non-prescription drugs, or dietary supplements you use. Also tell them if you smoke, drink alcohol, or use illegal drugs. Some items may interact with your medicine. What should I watch for while using this medicine? Visit your prescriber or health care professional for regular checks on your progress and for the needed blood tests and blood pressure measurements. It is especially important for the doctor to make sure your hemoglobin level is in the desired range, to limit the risk of potential side effects and to give you the best benefit. Keep all appointments for any recommended tests. Check your blood pressure as directed. Ask your doctor what your blood pressure should be and when you should contact him or her. As your body makes more red blood cells, you may need to take iron, folic acid, or vitamin B supplements. Ask your doctor or health care provider which products are right for you. If you have kidney disease continue dietary restrictions, even though this medication can make you feel better. Talk with your doctor or health care professional about the   foods you eat and the vitamins that you take. What side effects may I notice from receiving this medicine? Side effects that you should report to your doctor or health care professional as soon as possible: -allergic reactions like skin rash, itching or hives, swelling of the face, lips, or tongue -breathing problems -changes in vision -chest pain -confusion, trouble speaking  or understanding -feeling faint or lightheaded, falls -high blood pressure -muscle aches or pains -pain, swelling, warmth in the leg -rapid weight gain -severe headaches -sudden numbness or weakness of the face, arm or leg -trouble walking, dizziness, loss of balance or coordination -seizures (convulsions) -swelling of the ankles, feet, hands -unusually weak or tired Side effects that usually do not require medical attention (report to your doctor or health care professional if they continue or are bothersome): -diarrhea -fever, chills (flu-like symptoms) -headaches -nausea, vomiting -redness, stinging, or swelling at site where injected This list may not describe all possible side effects. Call your doctor for medical advice about side effects. You may report side effects to FDA at 1-800-FDA-1088. Where should I keep my medicine? Keep out of the reach of children. Store in a refrigerator between 2 and 8 degrees C (36 and 46 degrees F). Do not freeze. Do not shake. Throw away any unused portion if using a single-dose vial. Throw away any unused medicine after the expiration date. NOTE: This sheet is a summary. It may not cover all possible information. If you have questions about this medicine, talk to your doctor, pharmacist, or health care provider.    2016, Elsevier/Gold Standard. (2008-08-18 10:23:57)  

## 2016-05-03 NOTE — Progress Notes (Signed)
Hematology and Oncology Follow Up Visit  Rachel Palmer [id number] [birth year] 75 y.o. 05/03/2016   Principle Diagnosis:  1. Sickle cell disease 2. 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 Chronic splenomegaly 4. Chronic thrombocytopenia   Current Therapy:   Aranesp 300 mcg for Hct < 10 IV iron as indicated - last received in Feb 2017    Interim History:  Rachel Palmer is here today for a follow-up. She is doing fairly well but states that she has had some generalized body aches since she went on her trip to [place]. She states that she went to an ED there for what she thought "might have been the beginning of a sickle cell crisis" and was treated with fluids and morphine for pain.  She received 2 units of blood in June and her Hgb is stable at 9.8. Her MCV is low at 69.  Her platelet count is improved at 79. No episodes of bleeding, bruising or petechiae.  No fever, chills, n/v, cough, rash, dizziness, chest pain, palpitations, abdominal pain or changes in her bowel or bladder habits. Her SOB with exertion is unchanged.  No lymphadenopathy found on exam.  No swelling or numbness in her extremities. She has had some mild tingling in her fingertips that comes and goes at times. She has had no syncopal episodes or falls.  She has maintained a good appetite and is staying well hydrated. Her weight is stable.   Medications:    Medication List       Accurate as of 05/03/16  3:13 PM. Always use your most recent med list.          albuterol 108 (90 Base) MCG/ACT inhaler Commonly known as:  PROVENTIL HFA;VENTOLIN HFA Inhale 2 puffs into the lungs every 6 (six) hours as needed for wheezing or shortness of breath.   BREO ELLIPTA IN Inhale into the lungs daily.   cetirizine 10 MG tablet Commonly known as:  ZYRTEC Take 10 mg by mouth daily.   folic acid 1 MG tablet Commonly known as:  FOLVITE Take 2 tablets (2 mg total) by mouth daily.   furosemide 20 MG tablet Commonly  known as:  LASIX Take 20 mg by mouth as needed for fluid.   hydrochlorothiazide 25 MG tablet Commonly known as:  HYDRODIURIL Take 25 mg by mouth daily.   insulin NPH-regular Human (70-30) 100 UNIT/ML injection Commonly known as:  NOVOLIN 70/30 Inject into the skin.   metoprolol succinate 100 MG 24 hr tablet Commonly known as:  TOPROL-XL Take 100 mg by mouth daily. Take with or immediately following a meal.   montelukast 10 MG tablet Commonly known as:  SINGULAIR Take 10 mg by mouth every morning.   multivitamin with minerals Tabs tablet Take 1 tablet by mouth daily.   omeprazole 40 MG capsule Commonly known as:  PRILOSEC Take 40 mg by mouth daily.   oxycodone 5 MG capsule Commonly known as:  OXY-IR Take 1 capsule (5 mg total) by mouth every 4 (four) hours as needed for pain.   ramipril 10 MG capsule Commonly known as:  ALTACE Take 10 mg by mouth.   sertraline 100 MG tablet Commonly known as:  ZOLOFT Take 100 mg by mouth.       Allergies:  Allergies  Allergen Reactions  . Keflex [Cephalexin] Hives  . Codeine Hives  . Heparin Other (See Comments)    Other Reaction: thrombocytopenia  . Heparin [facility]  . Iohexol Hives  Synthetic xray dye=iohexol   . Ioxaglate Hives  . Ivp Dye [Iodinated Diagnostic Agents] Hives  . Propoxyphene Other (See Comments)    Loopy  . Tape Rash    Skin peels Skin peels    Past Medical History, Surgical history, Social history, and Family History were reviewed and updated.  Review of Systems: All other 10 point review of systems is negative.   Physical Exam:  oral temperature is 97.9 F (36.6 C). Her blood pressure is 132/104 (abnormal) and her pulse is 96. Her respiration is 20.   Wt Readings from Last 3 Encounters:  03/03/16 192 lb (87.1 kg)  01/25/16 193 lb (87.5 kg)  12/17/15 189 lb (85.7 kg)    Ocular: Sclerae unicteric, pupils equal, round and reactive to light Ear-nose-throat: Oropharynx clear,  dentition fair Lymphatic: No cervical supraclavicular or axillary adenopathy Lungs no rales or rhonchi, good excursion bilaterally Heart regular rate and rhythm, no murmur appreciated Abd soft, right lower quadrant slightly tender, positive bowel sounds, no liver or spleen tip found on exam, no fluid wave MSK no focal spinal tenderness, no joint edema Neuro: non-focal, well-oriented, appropriate affect Breasts: Deferred  Lab Results  Component Value Date   WBC 6.5 05/03/2016   HGB 9.8 (L) 05/03/2016   HCT 28.1 (L) 05/03/2016   MCV 69 (L) 05/03/2016   PLT 79 (L) 05/03/2016   Lab Results  Component Value Date   FERRITIN 1,630 (H) 03/03/2016   IRON 84 03/03/2016   TIBC 221 (L) 03/03/2016   UIBC 137 03/03/2016   IRONPCTSAT 38 03/03/2016   Lab Results  Component Value Date   RETICCTPCT 8.7 (H) 11/26/2015   RBC 4.07 05/03/2016   RETICCTABS 135.0 09/16/2015   No results found for: KPAFRELGTCHN, LAMBDASER, KAPLAMBRATIO No results found for: IGGSERUM, IGA, IGMSERUM No results found for: Ronnald Ramp, A1GS, A2GS, Violet Baldy, MSPIKE, SPEI   Chemistry      Component Value Date/Time   NA 143 03/03/2016 0948   K 3.9 03/03/2016 0948   CL 110 11/26/2015 0515   CL 112 (H) 01/02/2013 1433   CO2 18 (L) 03/03/2016 0948   BUN 28.0 (H) 03/03/2016 0948   CREATININE 1.8 (H) 03/03/2016 0948      Component Value Date/Time   CALCIUM 8.5 03/03/2016 0948   ALKPHOS 90 03/03/2016 0948   AST 15 03/03/2016 0948   ALT 11 03/03/2016 0948   BILITOT 1.61 (H) 03/03/2016 0948     Impression and Plan: Rachel Palmer is a very pleasant 75 yo African American female with multiple hematologic issues including sickle cell disease, anemia of chronic renal failure and thrombocytopenia. She has had some joint aches and pains that come and go. Her Hgb is improved at 9.8 and an MCV of 69.  We will give her a dose of Aranesp today.  She will restart taking folic acid 2 mg PO daily.  We will  plan to see her back in 1 month for labs and follow-up.   She will contact us with any questions or concerns. We can certainly see her sooner if need be.   Eliezer Bottom, NP 8/16/20173:13 PM

## 2016-05-04 LAB — IRON AND TIBC
%SAT: 33 % (ref 21–57)
IRON: 71 ug/dL (ref 41–142)
TIBC: 215 ug/dL — ABNORMAL LOW (ref 236–444)
UIBC: 144 ug/dL (ref 120–384)

## 2016-05-04 LAB — RETICULOCYTES: RETICULOCYTE COUNT: 3.3 % — AB (ref 0.6–2.6)

## 2016-05-04 LAB — FERRITIN

## 2016-05-19 ENCOUNTER — Emergency Department (HOSPITAL_BASED_OUTPATIENT_CLINIC_OR_DEPARTMENT_OTHER): Payer: Medicare Other

## 2016-05-19 ENCOUNTER — Encounter (HOSPITAL_BASED_OUTPATIENT_CLINIC_OR_DEPARTMENT_OTHER): Payer: Self-pay

## 2016-05-19 ENCOUNTER — Emergency Department (HOSPITAL_BASED_OUTPATIENT_CLINIC_OR_DEPARTMENT_OTHER)
Admission: EM | Admit: 2016-05-19 | Discharge: 2016-05-19 | Disposition: A | Discharge status: Home / Self Care | Payer: Medicare Other | Attending: Emergency Medicine | Admitting: Emergency Medicine

## 2016-05-19 DIAGNOSIS — J441 Chronic obstructive pulmonary disease with (acute) exacerbation: Secondary | ICD-10-CM | POA: Insufficient documentation

## 2016-05-19 DIAGNOSIS — E119 Type 2 diabetes mellitus without complications: Secondary | ICD-10-CM | POA: Insufficient documentation

## 2016-05-19 DIAGNOSIS — Z87891 Personal history of nicotine dependence: Secondary | ICD-10-CM | POA: Diagnosis not present

## 2016-05-19 DIAGNOSIS — I509 Heart failure, unspecified: Secondary | ICD-10-CM | POA: Diagnosis not present

## 2016-05-19 DIAGNOSIS — I129 Hypertensive chronic kidney disease with stage 1 through stage 4 chronic kidney disease, or unspecified chronic kidney disease: Secondary | ICD-10-CM | POA: Insufficient documentation

## 2016-05-19 DIAGNOSIS — N184 Chronic kidney disease, stage 4 (severe): Secondary | ICD-10-CM | POA: Insufficient documentation

## 2016-05-19 DIAGNOSIS — D5700 Hb-SS disease with crisis, unspecified: Secondary | ICD-10-CM | POA: Diagnosis present

## 2016-05-19 LAB — CBC WITH DIFFERENTIAL/PLATELET
BASOS PCT: 0 %
Basophils Absolute: 0 10*3/uL (ref 0.0–0.1)
EOS PCT: 1 %
Eosinophils Absolute: 0.1 10*3/uL (ref 0.0–0.7)
HEMATOCRIT: 24.9 % — AB (ref 36.0–46.0)
HEMOGLOBIN: 8.6 g/dL — AB (ref 12.0–15.0)
LYMPHS PCT: 25 %
Lymphs Abs: 3.2 10*3/uL (ref 0.7–4.0)
MCH: 24 pg — AB (ref 26.0–34.0)
MCHC: 34.5 g/dL (ref 30.0–36.0)
MCV: 69.6 fL — ABNORMAL LOW (ref 78.0–100.0)
MONOS PCT: 8 %
Monocytes Absolute: 1 10*3/uL (ref 0.1–1.0)
NRBC: 3 /100{WBCs} — AB
Neutro Abs: 8.4 10*3/uL — ABNORMAL HIGH (ref 1.7–7.7)
Neutrophils Relative %: 66 %
Platelets: 55 10*3/uL — ABNORMAL LOW (ref 150–400)
RBC: 3.58 MIL/uL — ABNORMAL LOW (ref 3.87–5.11)
RDW: 22.1 % — ABNORMAL HIGH (ref 11.5–15.5)
WBC: 12.7 10*3/uL — ABNORMAL HIGH (ref 4.0–10.5)

## 2016-05-19 LAB — COMPREHENSIVE METABOLIC PANEL
ALT: 13 U/L — ABNORMAL LOW (ref 14–54)
AST: 20 U/L (ref 15–41)
Albumin: 4.6 g/dL (ref 3.5–5.0)
Alkaline Phosphatase: 94 U/L (ref 38–126)
Anion gap: 7 (ref 5–15)
BUN: 40 mg/dL — ABNORMAL HIGH (ref 6–20)
CO2: 16 mmol/L — ABNORMAL LOW (ref 22–32)
Calcium: 8.8 mg/dL — ABNORMAL LOW (ref 8.9–10.3)
Chloride: 115 mmol/L — ABNORMAL HIGH (ref 101–111)
Creatinine, Ser: 1.85 mg/dL — ABNORMAL HIGH (ref 0.44–1.00)
GFR calc Af Amer: 30 mL/min — ABNORMAL LOW (ref >60)
GFR calc non Af Amer: 26 mL/min — ABNORMAL LOW (ref >60)
Glucose, Bld: 198 mg/dL — ABNORMAL HIGH (ref 65–99)
Potassium: 5.5 mmol/L — ABNORMAL HIGH (ref 3.5–5.1)
Sodium: 138 mmol/L (ref 135–145)
Total Bilirubin: 1.1 mg/dL (ref 0.3–1.2)
Total Protein: 7.5 g/dL (ref 6.5–8.1)

## 2016-05-19 LAB — RETICULOCYTES
RBC.: 3.61 MIL/uL — ABNORMAL LOW (ref 3.87–5.11)
Retic Count, Absolute: 115.5 10*3/uL (ref 19.0–186.0)
Retic Ct Pct: 3.2 % — ABNORMAL HIGH (ref 0.4–3.1)

## 2016-05-19 MED ORDER — SODIUM CHLORIDE 0.9 % IV BOLUS (SEPSIS)
500.0000 mL | Freq: Once | INTRAVENOUS | Status: AC
  Administered 2016-05-19: 500 mL via INTRAVENOUS

## 2016-05-19 MED ORDER — HYDROMORPHONE HCL 1 MG/ML IJ SOLN
1.0000 mg | Freq: Once | INTRAMUSCULAR | Status: AC
  Administered 2016-05-19: 1 mg via INTRAVENOUS
  Filled 2016-05-19: qty 1

## 2016-05-19 NOTE — ED Provider Notes (Signed)
Emergency Department Provider Note  By signing my name below, I, Emmanuella Mensah, attest that this documentation has been prepared under the direction and in the presence of Margette Fast, MD. Electronically Signed: Judithann Sauger, ED Scribe. 05/19/16. 9:33 PM.   Margette Fast, MD has reviewed the triage vital signs and the nursing notes.   HISTORY  Chief Complaint Sickle Cell Pain Crisis   HPI Comments: Rachel Palmer is a 75 y.o. female with a hx of sickle cell disease, insulin dependent DM, hypertension, COPD, and CKD who presents to the Emergency Department complaining of ongoing severe generalized joint pain, worse in her back and right knee onset yesterday. She explains that in the past she normally has left sided joint pain with her pain crisis but now the pain is generalized. She adds that she is also having acute pain traveling up and down her spine. She reports associated fever (highest 100 PTA) and chest wall pain. No alleviating factors noted. She states that she has tried Dilaudid with no relief. She notes that she was admitted on 11/20/2015 when she had her pain crisis and discharged on 11/26/2015 with pain medications. She states that she recently finished prednisone s/p URI. No chills, abdominal pain, n/v/d, generalized rash, or any open wounds.    Past Medical History:  Diagnosis Date  . Anemia, chronic renal failure 08/04/2011  . Blood transfusion without reported diagnosis   . Diabetes mellitus   . Fatty liver   . Hypertension   . Other iron deficiency anemias 10/21/2015  . Sickle cell disease ([place])   . Sickle cell disease, type [place] ([place]) 08/04/2011    Patient Active Problem List   Diagnosis Date Noted  . Head injuries 12/13/2015  . Malaise and fatigue 12/13/2015  . Hay fever 12/13/2015  . Fast heart beat 12/13/2015  . Dyspnea on exertion   . Anemia of chronic disease   . Anemia 11/21/2015  . Sickle cell pain crisis ([place]) 11/21/2015  . Insulin dependent  diabetes mellitus ([place]) 11/21/2015  . Sickle cell anemia with pain ([place]) 11/21/2015  . Other iron deficiency anemias 10/21/2015  . 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 Chronic obstructive pulmonary disease ([place]) 10/19/2015  . Adult BMI 30+ 10/19/2015  . 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 Chronic obstructive pulmonary disease with acute exacerbation ([place]) 08/27/2015  . Generalized OA 08/27/2015  . Metabolic acidosis, normal anion gap (NAG) 08/10/2015  . Gout 08/10/2015  . Type 2 diabetes mellitus with stage 3 chronic kidney disease ([place])   . Splenomegaly   . Microcytic anemia   . Essential hypertension   . Stage III chronic kidney disease   . Anemia in chronic kidney disease 07/29/2015  . Anemia due to chronic kidney disease 07/29/2015  . Excess or deficiency of vitamin D 07/26/2012  . Avitaminosis D 07/26/2012  . Multinodular goiter 07/25/2012  . Enlargement of spleen 07/25/2012  . Thrombocytopenia ([place]) 07/25/2012  . Airway hyperreactivity 03/26/2012  . Obstructive apnea 03/26/2012  . Arthritis of knee 12/18/2011  . Gonalgia 12/18/2011  . Chronic kidney disease (CKD), stage IV (severe) ([place]) 08/18/2011  . Type 2 diabetes mellitus ([place]) 08/18/2011  . Acid reflux 08/18/2011  . BP (high blood pressure) 08/18/2011  . 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 Chronic kidney disease, stage IV (severe) ([place]) 08/18/2011  . Chronic kidney disease 08/18/2011  . Anemia, chronic renal failure 08/04/2011  . Sickle cell  disease, type [place] ([place]) 08/04/2011  . Anemia associated with chronic renal failure 08/04/2011  . Sickle cell-hemoglobin C disease ([place]) 08/04/2011  . D (diarrhea) 07/12/2011    Past Surgical History:  Procedure Laterality Date  . APPENDECTOMY    . CHOLECYSTECTOMY    . KNEE SURGERY    . SHOULDER SURGERY      Current Outpatient Rx  . Order #: [id number] Class: Historical Med  . Order  #: [id number] Class: Historical Med  . Order #: [id number] Class: Historical Med  . Order #: [id number] Class: Normal  . Order #: [id number] Class: Historical Med  . Order #: [id number] Class: Historical Med  . Order #: [id number] Class: Historical Med  . Order #: [id number] Class: Historical Med  . Order #: [id number] Class: Historical Med  . Order #: [id number] Class: Historical Med  . Order #: [id number] Class: Historical Med  . Order #: [id number] Class: Print  . Order #: [id number] Class: Historical Med  . Order #: [id number] Class: Historical Med    Allergies Keflex [cephalexin]; Codeine; Heparin; Heparin cross reactors; Iohexol; Ioxaglate; Ivp dye [iodinated diagnostic agents]; Propoxyphene; and Tape  Family History  Problem Relation Age of Onset  . Stroke Mother     Social History Social History  Substance Use Topics  . Smoking status: Former Research scientist (life sciences)  . Smokeless tobacco: Never Used  . Alcohol use No    Review of Systems Constitutional: Fever. No chills Eyes: No visual changes. ENT: No sore throat. Cardiovascular: Denies chest pain. Respiratory: Denies shortness of breath. Gastrointestinal: No abdominal pain.  No nausea, no vomiting.  No diarrhea.  No constipation. Genitourinary: Negative for dysuria. Musculoskeletal: Back pain. Skin: Negative for rash. Neurological: Negative for headaches, focal weakness or numbness.  10-point ROS otherwise negative.  ____________________________________________   PHYSICAL EXAM:  VITAL SIGNS: ED Triage Vitals  Enc Vitals Group     BP 05/19/16 2040 118/77     Pulse Rate 05/19/16 2040 85     Resp 05/19/16 2040 20     Temp 05/19/16 2040 97.6 F (36.4 C)     Temp Source 05/19/16 2040 Oral     SpO2 05/19/16 2040 98 %     Weight 05/19/16 2041 187 lb (84.8 kg)     Height 05/19/16 2041 5\' 5"  (1.651 m)   Constitutional: Alert and oriented. Well appearing and in no acute distress. Eyes: Conjunctivae are normal.  Head: Atraumatic. Nose: No  congestion/rhinnorhea. Mouth/Throat: Mucous membranes are moist.  Oropharynx non-erythematous. Neck: No stridor.   Cardiovascular: Normal rate, regular rhythm. Good peripheral circulation. Grossly normal heart sounds.   Respiratory: Normal respiratory effort.  No retractions. Lungs CTAB. Gastrointestinal: Soft and nontender. No distention.  Musculoskeletal: No lower extremity tenderness nor edema. No gross deformities of extremities. Neurologic:  Normal speech and language. No gross focal neurologic deficits are appreciated.  Skin:  Skin is warm, dry and intact. No rash noted. Psychiatric: Mood and affect are normal. Speech and behavior are normal.  ____________________________________________ DIAGNOSTIC STUDIES: Oxygen Saturation is 98% on RA, normal by my interpretation.    COORDINATION OF CARE: 9:24 PM- Pt advised of plan for treatment and pt agrees. Pt will receive chest x-ray. She will receive IV fluids and Dilaudid.    LABS (all labs ordered are listed, but only abnormal results are displayed)  Labs Reviewed  CBC WITH DIFFERENTIAL/PLATELET - Abnormal; Notable for the following:       Result Value   WBC 12.7 (*)    RBC 3.58 (*)    Hemoglobin 8.6 (*)    HCT 24.9 (*)  MCV 69.6 (*)    MCH 24.0 (*)    RDW 22.1 (*)    Platelets 55 (*)    nRBC 3 (*)    Neutro Abs 8.4 (*)    All other components within normal limits  RETICULOCYTES - Abnormal; Notable for the following:    Retic Ct Pct 3.2 (*)    RBC. 3.61 (*)    All other components within normal limits  COMPREHENSIVE METABOLIC PANEL - Abnormal; Notable for the following:    Potassium 5.5 (*)    Chloride 115 (*)    CO2 16 (*)    Glucose, Bld 198 (*)    BUN 40 (*)    Creatinine, Ser 1.85 (*)    Calcium 8.8 (*)    ALT 13 (*)    GFR calc non Af Amer 26 (*)    GFR calc Af Amer 30 (*)    All other components within normal limits   ____________________________________________  RADIOLOGY  Dg Chest 2 View  Result  Date: 05/19/2016 CLINICAL DATA:  Acute onset of left-sided chest pain. Initial encounter. EXAM: CHEST  2 VIEW COMPARISON:  Chest radiograph performed 06/23/2011 FINDINGS: The lungs are well-aerated. Minimal bibasilar opacities likely reflect atelectasis. There is no evidence of pleural effusion or pneumothorax. The heart is normal in size; the mediastinal contour is within normal limits. No acute osseous abnormalities are seen. A right-sided shoulder arthroplasty is grossly unremarkable in appearance. IMPRESSION: Minimal bibasilar opacities likely reflect atelectasis. Lungs otherwise grossly clear. Electronically Signed   By: Garald Balding M.D.   On: 05/19/2016 21:46    ____________________________________________   PROCEDURES  Procedure(s) performed:   Procedures  None ____________________________________________   INITIAL IMPRESSION / ASSESSMENT AND PLAN / ED COURSE  Pertinent labs & imaging results that were available during my care of the patient were reviewed by me and considered in my medical decision making (see chart for details).  Patient presents to the emergency department for evaluation of total body pain similar to sickle cell pain. No evidence on exam or increased clinical suspicion for acute chest or other clear source of active infection. Plan for pain control, IV fluids, and reassessment. Will obtain chest x-ray given the diffuse nature of pain and report of subjective fever. Patient did require recent blood transfusion. We will send labs and reassess.  10:26 PM Patient with improved pain after 1 mg IV Dilaudid. She feels ready to return home to continue PO medication. No fever. No infiltrate on chest x-ray or high index of clinical suspicion for acute chest. Patient's hemoglobin at an acceptable level and do not feel she needs acute blood transfusion. No other clear source of infection. Leukocytosis noted but without clear source of infection will have the patient follow with  her hematologist and primary care physician as needed. Discussed return precautions in detail. The patient has a sober driver home.   At this time, I do not feel there is any life-threatening condition present. I have reviewed and discussed all results (EKG, imaging, lab, urine as appropriate), exam findings with patient. I have reviewed nursing notes and appropriate previous records.  I feel the patient is safe to be discharged home without further emergent workup. Discussed usual and customary return precautions. Patient and family (if present) verbalize understanding and are comfortable with this plan.  Patient will follow-up with their primary care provider. If they do not have a primary care provider, information for follow-up has been provided to them. All questions have been answered.  ____________________________________________  FINAL CLINICAL IMPRESSION(S) / ED DIAGNOSES  Final diagnoses:  Sickle cell pain crisis ([place])     MEDICATIONS GIVEN DURING THIS VISIT:  Medications  HYDROmorphone (DILAUDID) injection 1 mg (1 mg Intravenous Given 05/19/16 2137)  sodium chloride 0.9 % bolus 500 mL (0 mLs Intravenous Stopped 05/19/16 2235)     NEW OUTPATIENT MEDICATIONS STARTED DURING THIS VISIT:  None  Documentation performed with the assistance of the scribe. I have reviewed documentation and made changes as necessary.    Note:  This document was prepared using Dragon voice recognition software and may include unintentional dictation errors.  Nanda Quinton, MD Emergency Medicine    Margette Fast, MD 05/20/16 [phone]

## 2016-05-19 NOTE — Discharge Instructions (Signed)
You were seen in the ED today with pain likely due to sickle cell pain crisis. We were able to control your pain. Your labs and chest x-ray were reviewed and I believe that you are safe to return home and follow up with your PCP and hematologist in the coming days.   Return to the ED immediately with any chest pain, difficulty breathing, fever, or pain that is too difficult to control at home.

## 2016-05-19 NOTE — ED Triage Notes (Signed)
C/o "sickle cell crisis pain"-pain to legs shoulders back-presents to triage in w/c

## 2016-05-24 ENCOUNTER — Telehealth: Payer: Self-pay | Admitting: *Deleted

## 2016-05-24 NOTE — Telephone Encounter (Signed)
Received call from Patient sister Charlottie who lives in [place]. PAtient had to be taken to [facility] after breaking into her apt .  Hgb was 4.0.  Patient wants to be transferred to Dr. Marin Olp care.  Dr. Marin Olp informed of this situation.  States that patient best option was to be transferred to [facility] who can better handle her medical issues.  States patient is a difficult crossmatch.  Sister understands this and will talk to her sister.

## 2016-05-25 ENCOUNTER — Inpatient Hospital Stay (HOSPITAL_COMMUNITY): Payer: Medicare Other

## 2016-05-25 ENCOUNTER — Encounter: Payer: Self-pay | Admitting: Nurse Practitioner

## 2016-05-25 ENCOUNTER — Telehealth: Payer: Self-pay | Admitting: Nurse Practitioner

## 2016-05-25 ENCOUNTER — Observation Stay (HOSPITAL_COMMUNITY)
Admission: AD | Admit: 2016-05-25 | Discharge: 2016-05-26 | Disposition: A | Discharge status: Home / Self Care | Payer: Medicare Other | Source: Other Acute Inpatient Hospital | Attending: Internal Medicine | Admitting: Internal Medicine

## 2016-05-25 ENCOUNTER — Encounter (HOSPITAL_COMMUNITY): Payer: Self-pay | Admitting: Internal Medicine

## 2016-05-25 DIAGNOSIS — Z79899 Other long term (current) drug therapy: Secondary | ICD-10-CM | POA: Insufficient documentation

## 2016-05-25 DIAGNOSIS — I10 Essential (primary) hypertension: Secondary | ICD-10-CM | POA: Diagnosis present

## 2016-05-25 DIAGNOSIS — N183 Chronic kidney disease, stage 3 unspecified: Secondary | ICD-10-CM

## 2016-05-25 DIAGNOSIS — E1122 Type 2 diabetes mellitus with diabetic chronic kidney disease: Secondary | ICD-10-CM | POA: Diagnosis not present

## 2016-05-25 DIAGNOSIS — D57211 Sickle-cell/Hb-C disease with acute chest syndrome: Secondary | ICD-10-CM | POA: Diagnosis not present

## 2016-05-25 DIAGNOSIS — Z87891 Personal history of nicotine dependence: Secondary | ICD-10-CM | POA: Insufficient documentation

## 2016-05-25 DIAGNOSIS — R531 Weakness: Secondary | ICD-10-CM

## 2016-05-25 DIAGNOSIS — I1310 Hypertensive heart and chronic kidney disease without heart failure, with stage 1 through stage 4 chronic kidney disease, or unspecified chronic kidney disease: Principal | ICD-10-CM | POA: Insufficient documentation

## 2016-05-25 DIAGNOSIS — Z7951 Long term (current) use of inhaled steroids: Secondary | ICD-10-CM | POA: Insufficient documentation

## 2016-05-25 DIAGNOSIS — D5720 Sickle-cell/Hb-C disease without crisis: Secondary | ICD-10-CM | POA: Diagnosis present

## 2016-05-25 DIAGNOSIS — Z794 Long term (current) use of insulin: Secondary | ICD-10-CM | POA: Diagnosis not present

## 2016-05-25 DIAGNOSIS — R0602 Shortness of breath: Secondary | ICD-10-CM

## 2016-05-25 DIAGNOSIS — D5700 Hb-SS disease with crisis, unspecified: Secondary | ICD-10-CM

## 2016-05-25 LAB — COMPREHENSIVE METABOLIC PANEL
ALK PHOS: 80 U/L (ref 38–126)
ALT: 20 U/L (ref 14–54)
AST: 29 U/L (ref 15–41)
Albumin: 3.3 g/dL — ABNORMAL LOW (ref 3.5–5.0)
Anion gap: 8 (ref 5–15)
BUN: 41 mg/dL — AB (ref 6–20)
CALCIUM: 8 mg/dL — AB (ref 8.9–10.3)
CO2: 13 mmol/L — AB (ref 22–32)
CREATININE: 1.71 mg/dL — AB (ref 0.44–1.00)
Chloride: 122 mmol/L — ABNORMAL HIGH (ref 101–111)
GFR, EST AFRICAN AMERICAN: 33 mL/min — AB (ref >60)
GFR, EST NON AFRICAN AMERICAN: 28 mL/min — AB (ref >60)
Glucose, Bld: 222 mg/dL — ABNORMAL HIGH (ref 65–99)
Potassium: 4.2 mmol/L (ref 3.5–5.1)
SODIUM: 143 mmol/L (ref 135–145)
Total Bilirubin: 1.3 mg/dL — ABNORMAL HIGH (ref 0.3–1.2)
Total Protein: 6.4 g/dL — ABNORMAL LOW (ref 6.5–8.1)

## 2016-05-25 LAB — GLUCOSE, CAPILLARY: GLUCOSE-CAPILLARY: 202 mg/dL — AB (ref 65–99)

## 2016-05-25 LAB — CBC WITH DIFFERENTIAL/PLATELET
BASOS PCT: 0 %
Basophils Absolute: 0 10*3/uL (ref 0.0–0.1)
EOS PCT: 1 %
Eosinophils Absolute: 0.2 10*3/uL (ref 0.0–0.7)
HCT: 21.2 % — ABNORMAL LOW (ref 36.0–46.0)
HEMOGLOBIN: 7.2 g/dL — AB (ref 12.0–15.0)
LYMPHS ABS: 4.2 10*3/uL — AB (ref 0.7–4.0)
Lymphocytes Relative: 21 %
MCH: 24.9 pg — AB (ref 26.0–34.0)
MCHC: 34 g/dL (ref 30.0–36.0)
MCV: 73.4 fL — AB (ref 78.0–100.0)
MONOS PCT: 2 %
Monocytes Absolute: 0.4 10*3/uL (ref 0.1–1.0)
NEUTROS ABS: 15.2 10*3/uL — AB (ref 1.7–7.7)
Neutrophils Relative %: 76 %
Platelets: 106 10*3/uL — ABNORMAL LOW (ref 150–400)
RBC: 2.89 MIL/uL — ABNORMAL LOW (ref 3.87–5.11)
RDW: 26.7 % — ABNORMAL HIGH (ref 11.5–15.5)
WBC: 20 10*3/uL — ABNORMAL HIGH (ref 4.0–10.5)
nRBC: 159 /100 WBC — ABNORMAL HIGH

## 2016-05-25 LAB — TYPE AND SCREEN
ABO/RH(D): O POS
Antibody Screen: POSITIVE
DAT, IgG: POSITIVE

## 2016-05-25 LAB — LACTATE DEHYDROGENASE: LDH: 643 U/L — AB (ref 98–192)

## 2016-05-25 LAB — TSH: TSH: 2.74 u[IU]/mL (ref 0.350–4.500)

## 2016-05-25 MED ORDER — FLUTICASONE FUROATE-VILANTEROL 100-25 MCG/INH IN AEPB
1.0000 | INHALATION_SPRAY | Freq: Every day | RESPIRATORY_TRACT | Status: DC
  Administered 2016-05-26: 1 via RESPIRATORY_TRACT
  Filled 2016-05-25: qty 28

## 2016-05-25 MED ORDER — RAMIPRIL 10 MG PO CAPS
10.0000 mg | ORAL_CAPSULE | Freq: Every day | ORAL | Status: DC
  Administered 2016-05-26: 10 mg via ORAL
  Filled 2016-05-25: qty 1

## 2016-05-25 MED ORDER — ONDANSETRON HCL 4 MG/2ML IJ SOLN: 4.0000 mg | Freq: Four times a day (QID) | INTRAMUSCULAR | Status: DC | PRN

## 2016-05-25 MED ORDER — ACETAMINOPHEN 325 MG PO TABS: 650.0000 mg | ORAL_TABLET | Freq: Four times a day (QID) | ORAL | Status: DC | PRN

## 2016-05-25 MED ORDER — ONDANSETRON HCL 4 MG PO TABS
4.0000 mg | ORAL_TABLET | Freq: Four times a day (QID) | ORAL | Status: DC | PRN
Start: 2016-05-25 — End: 2016-05-26

## 2016-05-25 MED ORDER — FUROSEMIDE 20 MG PO TABS: 20.0000 mg | ORAL_TABLET | ORAL | Status: DC | PRN

## 2016-05-25 MED ORDER — OXYCODONE HCL 5 MG PO TABS: 5.0000 mg | ORAL_TABLET | ORAL | Status: DC | PRN

## 2016-05-25 MED ORDER — ADULT MULTIVITAMIN W/MINERALS CH
1.0000 | ORAL_TABLET | Freq: Every day | ORAL | Status: DC
  Administered 2016-05-25 – 2016-05-26 (×2): 1 via ORAL
  Filled 2016-05-25 (×2): qty 1

## 2016-05-25 MED ORDER — ALBUTEROL SULFATE (2.5 MG/3ML) 0.083% IN NEBU: 2.5000 mg | INHALATION_SOLUTION | Freq: Four times a day (QID) | RESPIRATORY_TRACT | Status: DC | PRN

## 2016-05-25 MED ORDER — SERTRALINE HCL 100 MG PO TABS
100.0000 mg | ORAL_TABLET | Freq: Every day | ORAL | Status: DC
  Administered 2016-05-25 – 2016-05-26 (×2): 100 mg via ORAL
  Filled 2016-05-25 (×2): qty 1

## 2016-05-25 MED ORDER — INSULIN ASPART PROT & ASPART (70-30 MIX) 100 UNIT/ML ~~LOC~~ SUSP: 33.0000 [IU] | Freq: Two times a day (BID) | SUBCUTANEOUS | Status: DC

## 2016-05-25 MED ORDER — INSULIN ASPART 100 UNIT/ML ~~LOC~~ SOLN
0.0000 [IU] | Freq: Three times a day (TID) | SUBCUTANEOUS | Status: DC
  Administered 2016-05-26: 2 [IU] via SUBCUTANEOUS
  Administered 2016-05-26: 1 [IU] via SUBCUTANEOUS

## 2016-05-25 MED ORDER — PANTOPRAZOLE SODIUM 40 MG PO TBEC
40.0000 mg | DELAYED_RELEASE_TABLET | Freq: Every day | ORAL | Status: DC
  Filled 2016-05-25: qty 1

## 2016-05-25 MED ORDER — ACETAMINOPHEN 650 MG RE SUPP: 650.0000 mg | Freq: Four times a day (QID) | RECTAL | Status: DC | PRN

## 2016-05-25 MED ORDER — FOLIC ACID 1 MG PO TABS
2.0000 mg | ORAL_TABLET | Freq: Every day | ORAL | Status: DC
  Administered 2016-05-26: 2 mg via ORAL
  Filled 2016-05-25: qty 2

## 2016-05-25 MED ORDER — HYDROCHLOROTHIAZIDE 25 MG PO TABS
25.0000 mg | ORAL_TABLET | Freq: Every day | ORAL | Status: DC
  Administered 2016-05-25 – 2016-05-26 (×2): 25 mg via ORAL
  Filled 2016-05-25 (×2): qty 1

## 2016-05-25 MED ORDER — INSULIN ASPART PROT & ASPART (70-30 MIX) 100 UNIT/ML ~~LOC~~ SUSP
36.0000 [IU] | Freq: Every day | SUBCUTANEOUS | Status: DC
  Administered 2016-05-26: 36 [IU] via SUBCUTANEOUS
  Filled 2016-05-25: qty 10

## 2016-05-25 MED ORDER — LORATADINE 10 MG PO TABS
10.0000 mg | ORAL_TABLET | Freq: Every day | ORAL | Status: DC
  Administered 2016-05-25 – 2016-05-26 (×2): 10 mg via ORAL
  Filled 2016-05-25 (×2): qty 1

## 2016-05-25 MED ORDER — INSULIN ASPART PROT & ASPART (70-30 MIX) 100 UNIT/ML ~~LOC~~ SUSP
33.0000 [IU] | Freq: Every day | SUBCUTANEOUS | Status: DC
  Administered 2016-05-26: 33 [IU] via SUBCUTANEOUS
  Filled 2016-05-25: qty 10

## 2016-05-25 MED ORDER — MONTELUKAST SODIUM 10 MG PO TABS
10.0000 mg | ORAL_TABLET | Freq: Every morning | ORAL | Status: DC
  Administered 2016-05-26: 10 mg via ORAL
  Filled 2016-05-25: qty 1

## 2016-05-25 NOTE — Telephone Encounter (Signed)
Patient called stating she wanted to be transferred from [facility]. States "she was admitted x2days ago and received 2uprbc transfusion for a hgb of 4, today they told her she needed another transfusion because her results only came up to 6". States "she wants Dr. Marin Olp to call her doctor at [facility] and tell him he wants her transferred to [facility] for better treatment and she has refused to have blood transfusion or any invasive care there". Informed patient that Dr. Marin Olp is not able to contact her provider at [facility] and speak to him in regards to transferring her care to [facility] and that he is not able to admit to [facility] based on her preference. Informed her that if she would like to provider her [facility] attending MD with our office number Dr. Marin Olp would be more than happy to offer insight on her current care that she has received here. Also reinterated what she was told yesterday when she called that Dr. Antonieta Pert recommendation would be for her to be transferred to [facility] if she insist on being transferred as they have a better handle on her health and sickle cell needs including blood transfusions and exchanges. Patient stated she did not want to be at [facility], advised her that Dr. Marin Olp is aware she is in there and it sounds as though she is receiving the care she needs based on her hgb levels that she provided to [place]. Patient states she is still going to speak with her provider at [facility] and see if he can call Dr. Marin Olp and or [facility] admitting doctor to see if he would agree she needs a transfer. Informed her again Dr. Marin Olp could not accept her as a transfer or for admission but would be more than willing to offer assistance and medical insight to the provider if he wished to call. Patient verbalized understanding and appreciation. Instructed her to make sure to keep upcoming appointment that is scheduled.

## 2016-05-25 NOTE — Progress Notes (Signed)
Received a call from Dr. Tawanna Solo at [facility] with patient requesting transfer for sickle cell management to [place] notes from Dr. Marin Olp regarding [facility] transfer but patient refused, Dr. Marin Olp agreeable with [name] transfer as patient has difficult to manage sickle cell and is difficult match for [facility].  Accepted to sickle cell team on med surge.    Rachel Palmer

## 2016-05-25 NOTE — H&P (Signed)
History and Physical    Rachel Palmer [id number] DOB: 08/01/1941 DOA: 05/25/2016  PCP: Maylon Peppers, MD  Patient coming from: [place] [facility].  Chief Complaint: Weakness.  HPI: Rachel Palmer is a 75 y.o. female with known history of sickle cell anemia, hypertension, diabetes mellitus, chronic kidney disease stage IV who was admitted at [facility] after patient was found on the floor. Patient states she has been feeling weak for last 1 week. And her legs gave away and found it difficult to walk. EMS was eventually called by the neighbor and patient was taken to [facility]. CT head done at [facility] as per the reports were negative for anything acute. Patient's  hemoglobin was found to be  around 4.5 and patient had received 2 units of packed red blood cell transfusion. Since patient's hematologist is at [facility] patient requested transfer to [facility]. On my exam patient denies any chest pain shortness of breath. Appears nonfocal. Repeat labs over here are pending.   ED Course: Patient was a direct admit.  Review of Systems: As per HPI, rest all negative.   Past Medical History:  Diagnosis Date  . Anemia, chronic renal failure 08/04/2011  . Blood transfusion without reported diagnosis   . Diabetes mellitus   . Fatty liver   . Hypertension   . Other iron deficiency anemias 10/21/2015  . Sickle cell disease ([place])   . Sickle cell disease, type [place] ([place]) 08/04/2011    Past Surgical History:  Procedure Laterality Date  . APPENDECTOMY    . CHOLECYSTECTOMY    . KNEE SURGERY    . SHOULDER SURGERY       reports that she has quit smoking. She has never used smokeless tobacco. She reports that she does not drink alcohol or use drugs.  Allergies  Allergen Reactions  . Keflex [Cephalexin] Hives  . Codeine Hives  . Heparin Other (See Comments)    Other Reaction: thrombocytopenia    . Heparin [facility]  . Iohexol Hives    Synthetic xray dye=iohexol   . Ioxaglate Hives  . Ivp Dye [Iodinated Diagnostic Agents] Hives  . Propoxyphene Other (See Comments)    Loopy  . Tape Rash    Skin peels Skin peels    Family History  Problem Relation Age of Onset  . Stroke Mother     Prior to Admission medications   Medication Sig Start Date End Date Taking? Authorizing Provider  albuterol (PROVENTIL HFA;VENTOLIN HFA) 108 (90 BASE) MCG/ACT inhaler Inhale 2 puffs into the lungs every 6 (six) hours as needed for wheezing or shortness of breath.    Yes Historical Provider, MD  cetirizine (ZYRTEC) 10 MG tablet Take 10 mg by mouth daily.    Yes Historical Provider, MD  Fluticasone Furoate-Vilanterol (BREO ELLIPTA IN) Inhale into the lungs daily.   Yes Historical Provider, MD  folic acid (FOLVITE) 1 MG tablet Take 2 tablets (2 mg total) by mouth daily. 05/03/16  Yes Eliezer Bottom, NP  furosemide (LASIX) 20 MG tablet Take 20 mg by mouth as needed for fluid.  06/24/15  Yes Historical Provider, MD  hydrochlorothiazide (HYDRODIURIL) 25 MG tablet Take 25 mg by mouth daily.  09/04/15  Yes Historical Provider, MD  insulin NPH-regular Human (NOVOLIN 70/30) (70-30) 100 UNIT/ML injection Inject into the skin.    Yes Historical Provider, MD  metoprolol succinate (TOPROL-XL) 100 MG 24 hr  tablet Take 100 mg by mouth daily. Take with or immediately following a meal.   Yes Historical Provider, MD  montelukast (SINGULAIR) 10 MG tablet Take 10 mg by mouth every morning.   Yes Historical Provider, MD  Multiple Vitamin (MULTIVITAMIN WITH MINERALS) TABS tablet Take 1 tablet by mouth daily.   Yes Historical Provider, MD  omeprazole (PRILOSEC) 40 MG capsule Take 40 mg by mouth daily. 07/08/15  Yes Historical Provider, MD  oxycodone (OXY-IR) 5 MG capsule Take 1 capsule (5 mg total) by mouth every 4 (four) hours as needed for pain. Patient taking differently: Take 5 mg by mouth every 6 (six)  hours as needed for pain.  08/06/15  Yes Charlynne Cousins, MD  ramipril (ALTACE) 10 MG capsule Take 10 mg by mouth. 08/27/15  Yes Historical Provider, MD  sertraline (ZOLOFT) 100 MG tablet Take 100 mg by mouth. 11/09/15  Yes Historical Provider, MD    Physical Exam: Vitals:   05/25/16 1905  BP: (!) (P) 160/70  Pulse: (!) (P) 116  Resp: (P) 20  Temp: (P) 98.2 F (36.8 C)  TempSrc: (P) Oral  SpO2: (P) 100%      Constitutional: Not in distress. Vitals:   05/25/16 1905  BP: (!) (P) 160/70  Pulse: (!) (P) 116  Resp: (P) 20  Temp: (P) 98.2 F (36.8 C)  TempSrc: (P) Oral  SpO2: (P) 100%   Eyes: Anicteric mild pallor. ENMT: No discharge from the ears eyes nose or mouth. Neck: No JVD appreciated no mass felt. Respiratory: No rhonchi or crepitations. Cardiovascular: S1 and S2 heard. Abdomen: Soft nontender bowel sounds present. No guarding or rigidity. Musculoskeletal: No edema. Skin: No rash. Neurologic: Alert awake oriented to time place and person. Moves all extremities. Psychiatric: Appears normal.   Labs on Admission: I have personally reviewed following labs and imaging studies  CBC:  Recent Labs Lab 05/19/16 2055  WBC 12.7*  NEUTROABS 8.4*  HGB 8.6*  HCT 24.9*  MCV 69.6*  PLT 55*   Basic Metabolic Panel:  Recent Labs Lab 05/19/16 2055  NA 138  K 5.5*  CL 115*  CO2 16*  GLUCOSE 198*  BUN 40*  CREATININE 1.85*  CALCIUM 8.8*   GFR: Estimated Creatinine Clearance: 29.1 mL/min (by C-G formula based on SCr of 1.85 mg/dL). Liver Function Tests:  Recent Labs Lab 05/19/16 2055  AST 20  ALT 13*  ALKPHOS 94  BILITOT 1.1  PROT 7.5  ALBUMIN 4.6   No results for input(s): LIPASE, AMYLASE in the last 168 hours. No results for input(s): AMMONIA in the last 168 hours. Coagulation Profile: No results for input(s): INR, PROTIME in the last 168 hours. Cardiac Enzymes: No results for input(s): CKTOTAL, CKMB, CKMBINDEX, TROPONINI in the last 168  hours. BNP (last 3 results) No results for input(s): PROBNP in the last 8760 hours. HbA1C: No results for input(s): HGBA1C in the last 72 hours. CBG: No results for input(s): GLUCAP in the last 168 hours. Lipid Profile: No results for input(s): CHOL, HDL, LDLCALC, TRIG, CHOLHDL, LDLDIRECT in the last 72 hours. Thyroid Function Tests: No results for input(s): TSH, T4TOTAL, FREET4, T3FREE, THYROIDAB in the last 72 hours. Anemia Panel: No results for input(s): VITAMINB12, FOLATE, FERRITIN, TIBC, IRON, RETICCTPCT in the last 72 hours. Urine analysis:    Component Value Date/Time   COLORURINE YELLOW 06/20/2011 [place] 06/20/2011 1340   LABSPEC 1.014 06/20/2011 1340   PHURINE 5.5 06/20/2011 1340   [place] 06/20/2011 1340  HGBUR NEGATIVE 06/20/2011 [place] 06/20/2011 [place] 06/20/2011 1340   PROTEINUR NEGATIVE 06/20/2011 1340   UROBILINOGEN 0.2 06/20/2011 1340   NITRITE NEGATIVE 06/20/2011 1340   LEUKOCYTESUR NEGATIVE 06/20/2011 1340   Sepsis Labs: @LABRCNTIP (procalcitonin:4,lacticidven:4) )No results found for this or any previous visit (from the past 240 hour(s)).   Radiological Exams on Admission: No results found.   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 Generalized weakness probably from symptomatic anemia - patient did receive 2 units of packed red blood cell transfusion. Repeat CBC has been ordered at this time. Closely follow CBC for hemoglobin and platelet counts. Patient also was found to have leukocytosis at [facility] but no definite source of infection. Patient is afebrile. Prior to transfer patients hematologist Dr. Burney Gauze was consulted. Please reconsult Dr. Marin Olp 11  AM. 2. History of sickle cell disease - denies any pain. 3. Diabetes mellitus2 - continue Novolin 70/30. Closely follow CBGs. 4. Chronic kidney disease stage IV - creatinine appears to be at baseline. Closely follow metabolic panel. 5. Hypertension - note that patient is on ramipril with history of chronic kidney disease. Continue hydrochlorothiazide metoprolol. 6. Chronic thrombocytopenia and splenomegaly - follow CBC.  Metabolic panel CBC chest x-ray and LDH TSH and EKG are pending.  DVT prophylaxis:  SCDs. Code Status:  Full code.  Family Communication:  Discussed with patient.  Disposition Plan:  Home.  Consults called:  None.  Admission status:  Observation.    Rise Patience MD [facility] Pager [phone].  If 7PM-7AM, please contact night-coverage [email or web address] Password [email or web address]  05/25/2016, 9:22 PM

## 2016-05-26 DIAGNOSIS — I1310 Hypertensive heart and chronic kidney disease without heart failure, with stage 1 through stage 4 chronic kidney disease, or unspecified chronic kidney disease: Secondary | ICD-10-CM | POA: Diagnosis not present

## 2016-05-26 DIAGNOSIS — N183 Chronic kidney disease, stage 3 (moderate): Secondary | ICD-10-CM | POA: Diagnosis not present

## 2016-05-26 DIAGNOSIS — D696 Thrombocytopenia, unspecified: Secondary | ICD-10-CM

## 2016-05-26 DIAGNOSIS — D638 Anemia in other chronic diseases classified elsewhere: Secondary | ICD-10-CM

## 2016-05-26 DIAGNOSIS — E1122 Type 2 diabetes mellitus with diabetic chronic kidney disease: Secondary | ICD-10-CM

## 2016-05-26 DIAGNOSIS — N189 Chronic kidney disease, unspecified: Secondary | ICD-10-CM

## 2016-05-26 DIAGNOSIS — R161 Splenomegaly, not elsewhere classified: Secondary | ICD-10-CM | POA: Diagnosis not present

## 2016-05-26 DIAGNOSIS — R531 Weakness: Secondary | ICD-10-CM

## 2016-05-26 DIAGNOSIS — D571 Sickle-cell disease without crisis: Secondary | ICD-10-CM | POA: Diagnosis not present

## 2016-05-26 DIAGNOSIS — I10 Essential (primary) hypertension: Secondary | ICD-10-CM

## 2016-05-26 DIAGNOSIS — D649 Anemia, unspecified: Secondary | ICD-10-CM

## 2016-05-26 DIAGNOSIS — D5720 Sickle-cell/Hb-C disease without crisis: Secondary | ICD-10-CM

## 2016-05-26 DIAGNOSIS — Z794 Long term (current) use of insulin: Secondary | ICD-10-CM

## 2016-05-26 LAB — IRON AND TIBC
Iron: 19 ug/dL — ABNORMAL LOW (ref 28–170)
SATURATION RATIOS: 10 % — AB (ref 10.4–31.8)
TIBC: 190 ug/dL — AB (ref 250–450)
UIBC: 171 ug/dL

## 2016-05-26 LAB — BASIC METABOLIC PANEL
Anion gap: 7 (ref 5–15)
BUN: 38 mg/dL — AB (ref 6–20)
CALCIUM: 8.1 mg/dL — AB (ref 8.9–10.3)
CHLORIDE: 118 mmol/L — AB (ref 101–111)
CO2: 16 mmol/L — ABNORMAL LOW (ref 22–32)
CREATININE: 1.6 mg/dL — AB (ref 0.44–1.00)
GFR calc non Af Amer: 31 mL/min — ABNORMAL LOW (ref >60)
GFR, EST AFRICAN AMERICAN: 36 mL/min — AB (ref >60)
Glucose, Bld: 199 mg/dL — ABNORMAL HIGH (ref 65–99)
Potassium: 4.1 mmol/L (ref 3.5–5.1)
SODIUM: 141 mmol/L (ref 135–145)

## 2016-05-26 LAB — CBC
HCT: 20.3 % — ABNORMAL LOW (ref 36.0–46.0)
Hemoglobin: 7.1 g/dL — ABNORMAL LOW (ref 12.0–15.0)
MCH: 25.4 pg — ABNORMAL LOW (ref 26.0–34.0)
MCHC: 35 g/dL (ref 30.0–36.0)
MCV: 72.8 fL — AB (ref 78.0–100.0)
PLATELETS: 106 10*3/uL — AB (ref 150–400)
RBC: 2.79 MIL/uL — AB (ref 3.87–5.11)
RDW: 26.4 % — ABNORMAL HIGH (ref 11.5–15.5)
WBC: 17 10*3/uL — AB (ref 4.0–10.5)

## 2016-05-26 LAB — RETICULOCYTES
RBC.: 2.9 MIL/uL — AB (ref 3.87–5.11)
Retic Count, Absolute: 162.4 10*3/uL (ref 19.0–186.0)
Retic Ct Pct: 5.6 % — ABNORMAL HIGH (ref 0.4–3.1)

## 2016-05-26 LAB — GLUCOSE, CAPILLARY
GLUCOSE-CAPILLARY: 171 mg/dL — AB (ref 65–99)
GLUCOSE-CAPILLARY: 147 mg/dL — AB (ref 65–99)
Glucose-Capillary: 176 mg/dL — ABNORMAL HIGH (ref 65–99)

## 2016-05-26 LAB — PATHOLOGIST SMEAR REVIEW

## 2016-05-26 LAB — FERRITIN: Ferritin: 1567 ng/mL — ABNORMAL HIGH (ref 11–307)

## 2016-05-26 MED ORDER — ORAL CARE MOUTH RINSE
15.0000 mL | Freq: Two times a day (BID) | OROMUCOSAL | Status: DC
  Administered 2016-05-26: 15 mL via OROMUCOSAL

## 2016-05-26 MED ORDER — DARBEPOETIN ALFA 300 MCG/0.6ML IJ SOSY
300.0000 ug | PREFILLED_SYRINGE | Freq: Once | INTRAMUSCULAR | Status: AC
  Administered 2016-05-26: 300 ug via SUBCUTANEOUS
  Filled 2016-05-26: qty 0.6

## 2016-05-26 NOTE — Consult Note (Signed)
Referral MD  Reason for Referral: Hemoglobin [place] disease. Chronic splenomegaly. Chronic thrombocytopenia.   No chief complaint on file. : I wanted to be transferred over to [facility] so you could help take care of me.  HPI: Rachel Palmer is well-known to me. She is a 75 year old Afro-American female with hemoglobin [place] disease. She has marked anemia. Typically, her hemoglobin is about 8 or 9. She is on Aranesp because of chronic renal insufficiency.  She typically has these crises every few months. Oftentimes, they have been when she is up in [place]. She had been over at [facility]. She says that she got sick over the Labor Day weekend. She is found to be on the floor. She said that she did not pass out but she just got weak and fell and could not get up.  She appeared had a hemoglobin of 4. She was transfused. She's given IV fluids.  Because I noted for such a long time, she felt that her care would be better served over [place]. She is transferred over yesterday.    it is sometimes hard to finally get what is going on with her. She does tend to ramble a little bit.  She is very lucid. She just tends to go off" tangents".   today, her white cell count is 17. Hemoglobin 7.1. Platelet count 106,000. Her creatinine is 1.6. Again she has chronic renal insufficiency.   about a month ago, her ferritin was 2600. Most of this is an acute phase reactant. Her total iron is 171 and her iron saturation is 33%.  Her LDH was 643 yesterday.    no reticulocyte count has been done. I'm sure she probably is reticking quite high.   I don't he has any infection. A chest x-ray initially was negative.  Her appetite is marginal. Sometimes she said that she does not want eat all that much. Sometimes she has nausea. Sometimes she has diarrhea. This is all chronic.  She has had no rashes.  She has had no headache.  Overall, her performance status is ECOG 1.    Past Medical  History:  Diagnosis Date  . Anemia, chronic renal failure 08/04/2011  . Blood transfusion without reported diagnosis   . Diabetes mellitus   . Fatty liver   . Hypertension   . Other iron deficiency anemias 10/21/2015  . Sickle cell disease ([place])   . Sickle cell disease, type [place] ([place]) 08/04/2011  :  Past Surgical History:  Procedure Laterality Date  . APPENDECTOMY    . CHOLECYSTECTOMY    . KNEE SURGERY    . SHOULDER SURGERY    :   Current Facility-Administered Medications:  .  acetaminophen (TYLENOL) tablet 650 mg, 650 mg, Oral, Q6H PRN **OR** acetaminophen (TYLENOL) suppository 650 mg, 650 mg, Rectal, Q6H PRN, Rise Patience, MD .  albuterol (PROVENTIL) (2.5 MG/3ML) 0.083% nebulizer solution 2.5 mg, 2.5 mg, Inhalation, Q6H PRN, Rise Patience, MD .  fluticasone furoate-vilanterol (BREO ELLIPTA) 100-25 MCG/INH 1 puff, 1 puff, Inhalation, Daily, Rise Patience, MD, 1 puff at 05/26/16 [phone] .  folic acid (FOLVITE) tablet 2 mg, 2 mg, Oral, Daily, Rise Patience, MD .  furosemide (LASIX) tablet 20 mg, 20 mg, Oral, PRN, Rise Patience, MD .  hydrochlorothiazide (HYDRODIURIL) tablet 25 mg, 25 mg, Oral, Daily, Rise Patience, MD, 25 mg at 05/25/16 2233 .  insulin aspart (novoLOG) injection 0-9 Units, 0-9 Units, Subcutaneous, TID WC, Rise Patience, MD, 2  Units at 05/26/16 0845 .  insulin aspart protamine- aspart (NOVOLOG MIX 70/30) injection 33 Units, 33 Units, Subcutaneous, Q breakfast, 33 Units at 05/26/16 0846 **AND** insulin aspart protamine- aspart (NOVOLOG MIX 70/30) injection 36 Units, 36 Units, Subcutaneous, Q supper, Rise Patience, MD .  loratadine (CLARITIN) tablet 10 mg, 10 mg, Oral, Daily, Rise Patience, MD, 10 mg at 05/25/16 2233 .  MEDLINE mouth rinse, 15 mL, Mouth Rinse, BID, Kinnie Feil, MD .  montelukast (SINGULAIR) tablet 10 mg, 10 mg, Oral, q morning - 10a, Rise Patience, MD .  multivitamin with minerals tablet 1  tablet, 1 tablet, Oral, Daily, Rise Patience, MD, 1 tablet at 05/25/16 2233 .  ondansetron (ZOFRAN) tablet 4 mg, 4 mg, Oral, Q6H PRN **OR** ondansetron (ZOFRAN) injection 4 mg, 4 mg, Intravenous, Q6H PRN, Rise Patience, MD .  oxyCODONE (Oxy IR/ROXICODONE) immediate release tablet 5 mg, 5 mg, Oral, Q4H PRN, Rise Patience, MD .  pantoprazole (PROTONIX) EC tablet 40 mg, 40 mg, Oral, Daily, Rise Patience, MD .  ramipril (ALTACE) capsule 10 mg, 10 mg, Oral, Daily, Rise Patience, MD .  sertraline (ZOLOFT) tablet 100 mg, 100 mg, Oral, Daily, Rise Patience, MD, 100 mg at 05/25/16 2233:  . fluticasone furoate-vilanterol  1 puff Inhalation Daily  . folic acid  2 mg Oral Daily  . hydrochlorothiazide  25 mg Oral Daily  . insulin aspart  0-9 Units Subcutaneous TID WC  . insulin aspart protamine- aspart  33 Units Subcutaneous Q breakfast   And  . insulin aspart protamine- aspart  36 Units Subcutaneous Q supper  . loratadine  10 mg Oral Daily  . mouth rinse  15 mL Mouth Rinse BID  . montelukast  10 mg Oral q morning - 10a  . multivitamin with minerals  1 tablet Oral Daily  . pantoprazole  40 mg Oral Daily  . ramipril  10 mg Oral Daily  . sertraline  100 mg Oral Daily  :  Allergies  Allergen Reactions  . Keflex [Cephalexin] Hives  . Codeine Hives  . Heparin Other (See Comments)    Other Reaction: thrombocytopenia  . Heparin [facility]  . Iohexol Hives    Synthetic xray dye=iohexol   . Ioxaglate Hives  . Ivp Dye [Iodinated Diagnostic Agents] Hives  . Propoxyphene Other (See Comments)    Loopy  . Tape Rash    Skin peels Skin peels  :  Family History  Problem Relation Age of Onset  . Stroke Mother   :  Social History   Social History  . Marital status: Married    Spouse name: N/A  . Number of children: N/A  . Years of education: N/A   Occupational History  . Not on file.   Social History Main Topics  . Smoking status: Former Research scientist (life sciences)   . Smokeless tobacco: Never Used  . Alcohol use No  . Drug use: No  . Sexual activity: Not on file   Other Topics Concern  . Not on file   Social History Narrative  . No narrative on file  :  Pertinent items are noted in HPI.  Exam: Patient Vitals for the past 24 hrs:  BP Temp Temp src Pulse Resp SpO2 Height Weight  05/26/16 0827 - - - 87 16 100 % - -  05/26/16 0631 (!) 145/71 98.7 F (37.1 C) Oral 86 16 100 % - 190 lb 11.2 oz (86.5 kg)  05/26/16 0200 Marland Kitchen)  143/60 98.3 F (36.8 C) Oral 84 16 100 % - -  05/25/16 1905 (!) 160/70 98.2 F (36.8 C) Oral (!) 116 20 100 % 5\' 5"  (1.651 m) 191 lb 2.2 oz (86.7 kg)   As above  Recent Labs  05/25/16 2143 05/26/16 0408  WBC 20.0* 17.0*  HGB 7.2* 7.1*  HCT 21.2* 20.3*  PLT 106* 106*    Recent Labs  05/25/16 2143 05/26/16 0408  NA 143 141  K 4.2 4.1  CL 122* 118*  CO2 13* 16*  GLUCOSE 222* 199*  BUN 41* 38*  CREATININE 1.71* 1.60*  CALCIUM 8.0* 8.1*    Blood smear review:  None   Pathology: None     Assessment and Plan:  Rachel Palmer is a 75 year old African-American female with hemoglobin S C disease. So far, she has managed to do fairly well with this. Shows chronic renal insufficiency. We do have to give her Aranesp on occasion. She may benefit from this as she is in the hospital.   I do think that we might have to consider a red cell exchange on her. I'm not sure if this is done in [place] now. I will have to speak with the dialysis unit to see if this is the case. If not, then under she has had this done at [facility] before. I suppose I could call them and see what they might be oh to do to help her out.   I will see what her reticulocyte count is. This will be helpful.   I will keep her on the folic acid.   For now, we will can to monitor her. I appreciate all the help that we are getting from the hospitalist and the staff on 3 W.   Lattie Haw, MD  Hebrews 12:12

## 2016-05-26 NOTE — Progress Notes (Signed)
Spoke with patient at bedside. States she is ready to d/c home, she says she is trying to find someone to take her home and she also is trying to find a key to her apartment. She has phoned a friend and call the apartment Freight forwarder. She states she lives alone, drives self some and also uses public transportation. She reports that she falls a lot but feels she is able to manage at home, she has people who help her. She does not seem to think her falling is an issue. She manages her own meals. Offered Private duty list for [employer], she declined. She initially did not want [place] set up but with encouragement is agreeable to PT and a nurse to assist with medication management. She had no preference for agency, has a friend who works with [facility] and is ok to use them. She also request a rollator, she does not think she has had one in the past but if she cant get one would like a cane. Contacted [facility] for [facility] referral and DME.

## 2016-05-26 NOTE — Progress Notes (Signed)
Called by Molinda Bailiff to talk with patient concerning discharge.  Sister and cousin have called and expressed concern with patient being discharged to home and being alone.  Patient alert and oriented x4 with no deficits noted.  MD, case manager and patients primary RN with no concerns with patients discharge.  Patient expresses no concern with discharge and has a plan if needs to return to hospital will call 911 for help and assistance.  Patient states, "I have lived alone for many years and able to care for myself."  Patient has a friend named Agricultural engineer"  Coming to pick her up and stay with her if needed.  Patient states, she has a key and will be able to care for self.  I have reported this to primary RN and night AC Audree Camel.  Plan of care discussed with patient and discharge instructions have been given by primary RN Maudie Mercury and patient has copy.  Patient expressed staff to not discuss her plan of care with family.  Patient spoke with cousin on the phone while I was in the room and discussed her disposition and plan of care.  Patient verbalizes no concerns or questions at this time.  Awaiting ride home.

## 2016-05-26 NOTE — Discharge Summary (Signed)
Rachel Palmer MRN: [id number] DOB/AGE: [birth year] 75 y.o.  Admit date: 05/25/2016 Discharge date: 05/26/2016  Primary Care Physician:  Maylon Peppers, MD   Discharge Diagnoses:   Patient Active Problem List   Diagnosis Date Noted  . Weakness generalized 05/25/2016  . Head injuries 12/13/2015  . Malaise and fatigue 12/13/2015  . Hay fever 12/13/2015  . Fast heart beat 12/13/2015  . Dyspnea on exertion   . Anemia of chronic disease   . Anemia 11/21/2015  . Sickle cell pain crisis ([place]) 11/21/2015  . Insulin dependent diabetes mellitus ([place]) 11/21/2015  . Sickle cell anemia with pain ([place]) 11/21/2015  . Other iron deficiency anemias 10/21/2015  . 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 Chronic obstructive pulmonary disease ([place]) 10/19/2015  . Adult BMI 30+ 10/19/2015  . 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 Chronic obstructive pulmonary disease with acute exacerbation ([place]) 08/27/2015  . Generalized OA 08/27/2015  . Metabolic acidosis, normal anion gap (NAG) 08/10/2015  . Gout 08/10/2015  . Type 2 diabetes mellitus with stage 3 chronic kidney disease ([place])   . Splenomegaly   . Microcytic anemia   . Essential hypertension   . Stage III chronic kidney disease   . Anemia in chronic kidney disease 07/29/2015  . Anemia due to chronic kidney disease 07/29/2015  . Excess or deficiency of vitamin D 07/26/2012  . Avitaminosis D 07/26/2012  . Multinodular goiter 07/25/2012  . Enlargement of spleen 07/25/2012  . Thrombocytopenia ([place]) 07/25/2012  . Airway hyperreactivity 03/26/2012  . Obstructive apnea 03/26/2012  . Arthritis of knee 12/18/2011  . Gonalgia 12/18/2011  . Chronic kidney disease (CKD), stage IV (severe) ([place]) 08/18/2011  . Type 2 diabetes mellitus ([place]) 08/18/2011  . Acid reflux 08/18/2011  . BP (high blood pressure) 08/18/2011  . 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 Chronic kidney  disease, stage IV (severe) ([place]) 08/18/2011  . Chronic kidney disease 08/18/2011  . Anemia, chronic renal failure 08/04/2011  . Sickle cell disease, type  ([place]) 08/04/2011  . Anemia associated with chronic renal failure 08/04/2011  . Sickle cell-hemoglobin C disease ([place]) 08/04/2011  . D (diarrhea) 07/12/2011    DISCHARGE MEDICATION:   Medication List    TAKE these medications   albuterol 108 (90 Base) MCG/ACT inhaler Commonly known as:  PROVENTIL HFA;VENTOLIN HFA Inhale 2 puffs into the lungs every 6 (six) hours as needed for wheezing or shortness of breath.   BREO ELLIPTA IN Inhale into the lungs daily.   cetirizine 10 MG tablet Commonly known as:  ZYRTEC Take 10 mg by mouth daily.   folic acid 1 MG tablet Commonly known as:  FOLVITE Take 2 tablets (2 mg total) by mouth daily.   furosemide 20 MG tablet Commonly known as:  LASIX Take 20 mg by mouth as needed for fluid.   hydrochlorothiazide 25 MG tablet Commonly known as:  HYDRODIURIL Take 25 mg by mouth daily.   insulin NPH-regular Human (70-30) 100 UNIT/ML injection Commonly known as:  NOVOLIN 70/30 Inject into the skin.   metoprolol succinate 100 MG 24 hr tablet Commonly known as:  TOPROL-XL Take 100 mg by mouth daily. Take with or immediately following a meal.   montelukast 10 MG tablet Commonly known as:  SINGULAIR Take 10 mg by mouth every morning.   multivitamin with minerals Tabs tablet Take 1 tablet by mouth daily.   omeprazole 40 MG capsule Commonly known as:  PRILOSEC Take  40 mg by mouth daily.   oxycodone 5 MG capsule Commonly known as:  OXY-IR Take 1 capsule (5 mg total) by mouth every 4 (four) hours as needed for pain. What changed:  when to take this   ramipril 10 MG capsule Commonly known as:  ALTACE Take 10 mg by mouth.   sertraline 100 MG tablet Commonly known as:  ZOLOFT Take 100 mg by mouth.         Consults: Treatment Team:  Volanda Napoleon, MD   SIGNIFICANT  DIAGNOSTIC STUDIES:  Dg Chest 2 View  Result Date: 05/19/2016 CLINICAL DATA:  Acute onset of left-sided chest pain. Initial encounter. EXAM: CHEST  2 VIEW COMPARISON:  Chest radiograph performed 06/23/2011 FINDINGS: The lungs are well-aerated. Minimal bibasilar opacities likely reflect atelectasis. There is no evidence of pleural effusion or pneumothorax. The heart is normal in size; the mediastinal contour is within normal limits. No acute osseous abnormalities are seen. A right-sided shoulder arthroplasty is grossly unremarkable in appearance. IMPRESSION: Minimal bibasilar opacities likely reflect atelectasis. Lungs otherwise grossly clear. Electronically Signed   By: Garald Balding M.D.   On: 05/19/2016 21:46   Dg Chest Port 1 View  Result Date: 05/25/2016 CLINICAL DATA:  Shortness of breath for 2-3 days. Sickle cell disease. Hypertension. Diabetes. EXAM: PORTABLE CHEST 1 VIEW COMPARISON:  05/23/2016. FINDINGS: The patient is rotated to the bright on today's radiograph, reducing diagnostic sensitivity and specificity. Heart size within normal limits for projection. Thoracic spondylosis.  The lungs appear clear. Right proximal humeral prosthesis. Old left lateral rib deformities. IMPRESSION: 1.  No active cardiopulmonary disease is radiographically apparent. Electronically Signed   By: Van Clines M.D.   On: 05/25/2016 23:38     No results found for this or any previous visit (from the past 240 hour(s)).  BRIEF ADMITTING H & P: Rachel Palmer is a 75 y.o. female with known history of sickle cell anemia, hypertension, diabetes mellitus, chronic kidney disease stage IV who was admitted at [facility] after patient was found on the floor. Patient states she has been feeling weak for last 1 week. And her legs gave away and found it difficult to walk. EMS was eventually called by the neighbor and patient was taken to [facility]. CT head done at [facility] as per the reports were negative for anything acute. Patient's  hemoglobin was found to be  around 4.5 and patient had received 2 units of packed red blood cell transfusion. Since patient's hematologist is at [facility] hospital patient requested transfer to [facility]. On my exam patient denies any chest pain shortness of breath. Appears nonfocal. Repeat labs over here are pending.  Hospital Course:  Present on Admission: Pt was transferred to [facility] last night from [facility] in [facility] where she was transfused 2 units RBC's for a symptomatic anemia of about 4.5 g/dL. Upon arrival here she was found to have a Hb of 7.2 g/dL and repeat labs this morning shows Hb stable at 7.1 g/dL. Her reticulocyte count is low for her level of anemia and she received a dose of Aranesp 300 mcg x  [place] x 1 today. She was seen in consult by Dr. Marin Olp and I have discussed her case with him. My recommendation is that patient be placed on a regular regimen of Darbopoetin and started on Hydrea at 15 mg /kg to induce a stable population of Hb F cells. Even  if Hb.Hct levels are low I would continue Hydrea as long as ANC and platelets are within acceptable limits and Reticulocyte manual count is above 90.  I do not think that serial RBC exchanges would be of much benefit in this patient as she does not have much sequela for her population of Sickle Cells.  Pt also had an elevated WBC count but without any signs of infection. It is felt that this is likely secondary to bone marrow activity.   By report patient was found on the floor at home thus I felt that it was important to evaluate her ambulation and stability with gait. Despite several requests by the staff and myself she refused to ambulate in the halls and states that she felt it was sufficient that she ambulated to the bathroom. She did not require any oxygen during or after ambulation to the bathroom. I feel that in the absence of her cooperation  to assess ambulation, an evaluation of her home setting and mobility and ADL function is appropriate for her safety. After much discussion she has agreed to [facility] RN and PT. A rollator walker was also ordered for her at her request.   Note that patient is lucid and alert and oriented x 3. She was able to have a coherent discussion about her care at [facility] and recall her next appointment with Dr. Marin Olp. Based on my previous interaction with this patient and per Dr. Marin Olp her Hematologist who sees her on a  Regular basis, patient is at baseline of her cognition and mentation at the time of discharge and has the capacity to make decisions.  Disposition and Follow-up: Pt is discharged home in stable condition and she has an order for [facility] RN and PT. She was also ordered a rollator walker. She also has  an appointment scheduled to see Dr. Marin Olp on 06/07/2016 which he feels is sufficient for follow up.    DISCHARGE EXAM:  General: Obese female. Awake, alert, oriented x 3, in no apparent distress.  Vital Signs: BP (!) 153/69, HR 94, T 97.6 F (36.4 C), temperature source Oral, RR 16, height 5\' 5"  (1.651 m), weight 86.5 kg (190 lb 11.2 oz), SpO2 98 % on RA.  HEENT: Ina/AT PEERL, EOMI, anicteric Neck: Trachea midline, no masses, no thyromegal,y no JVD, no carotid bruit OROPHARYNX: Moist, No exudate/ erythema/lesions.  Heart: Regular rate and rhythm, without murmurs, rubs, gallops or S3. PMI non-displaced. Exam reveals no decreased pulses. Pulmonary/Chest: Normal effort. Breath sounds normal. No. Apnea. Clear to auscultation,no stridor,  no wheezing and no rhonchi noted. No respiratory distress and no tenderness noted. Abdomen: Obese, soft, nontender, nondistended, normal bowel sounds, no masses no hepatosplenomegaly noted. No fluid wave and no ascites. There is no guarding or rebound. Neuro: Alert and oriented to person, place and time. Normal motor skills, Displays no atrophy or tremors and exhibits normal  muscle tone.  No focal neurological deficits noted cranial nerves II through XII grossly intact. No sensory deficit noted.  Strength at baseline in bilateral upper and lower extremities.  Musculoskeletal: No warmth, swelling or erythema around joints, no spinal tenderness noted. Psychiatric: Patient alert and oriented x3, good insight and cognition. Mood, memory, affect and judgement normal. Skin: Skin is warm and dry. No bruising, no ecchymosis and no rash noted. Pt is not diaphoretic. No erythema. No pallor   Recent Labs  05/25/16 2143 05/26/16 0408  NA 143 141  K 4.2 4.1  CL 122* 118*  CO2 13* 16*  GLUCOSE 222* 199*  BUN 41* 38*  CREATININE 1.71* 1.60*  CALCIUM 8.0* 8.1*    Recent Labs  05/25/16 2143  AST 29  ALT 20  ALKPHOS 80  BILITOT 1.3*  PROT 6.4*  ALBUMIN 3.3*   No results for input(s): LIPASE, AMYLASE in the last 72 hours.  Recent Labs  05/25/16 2143 05/26/16 0408  WBC 20.0* 17.0*  NEUTROABS 15.2*  --   HGB 7.2* 7.1*  HCT 21.2* 20.3*  MCV 73.4* 72.8*  PLT 106* 106*     Total time spent including face to face and decision making was greater than 30 minutes  Signed: MATTHEWS,MICHELLE A. 05/26/2016, 2:51 PM

## 2016-05-26 NOTE — Care Management Obs Status (Signed)
[place] NOTIFICATION   Patient Details  Name: Rachel Palmer MRN: [id number] Date of Birth: [birth year]   Medicare Observation Status Notification Given:  Yes    CrutchfieldAntony Haste, RN 05/26/2016, 12:26 PM

## 2016-05-29 ENCOUNTER — Other Ambulatory Visit: Payer: Self-pay | Admitting: Hematology & Oncology

## 2016-05-29 ENCOUNTER — Telehealth: Payer: Self-pay | Admitting: Hematology & Oncology

## 2016-05-29 NOTE — Telephone Encounter (Signed)
I called Mrs. Dorko this morning. I want to see how she was doing since she was discharged from the hospital on Friday.  I found out that her iron studies are on the low side. She clearly needs to have some IV iron. Of note, her ferritin is quite high which probably is an acute phase reactant.  I told her that I wanted to give her some IV iron before I saw her next week.  I told her to make sure that she calls the office when she gets this message so that we can get her set up for the IV iron.  As always, I told her that as wanted to pray for her. She is my sister in [place]. I just want to make her life as best as possible.  Lattie Haw, MD

## 2016-05-30 ENCOUNTER — Other Ambulatory Visit: Payer: Self-pay | Admitting: *Deleted

## 2016-05-30 ENCOUNTER — Ambulatory Visit (HOSPITAL_BASED_OUTPATIENT_CLINIC_OR_DEPARTMENT_OTHER): Payer: Medicare Other

## 2016-05-30 VITALS — BP 120/72 | HR 78 | Temp 97.9°F | Resp 20

## 2016-05-30 DIAGNOSIS — Z23 Encounter for immunization: Secondary | ICD-10-CM | POA: Diagnosis not present

## 2016-05-30 DIAGNOSIS — N184 Chronic kidney disease, stage 4 (severe): Secondary | ICD-10-CM

## 2016-05-30 DIAGNOSIS — D631 Anemia in chronic kidney disease: Secondary | ICD-10-CM

## 2016-05-30 DIAGNOSIS — D571 Sickle-cell disease without crisis: Secondary | ICD-10-CM | POA: Diagnosis present

## 2016-05-30 DIAGNOSIS — D5720 Sickle-cell/Hb-C disease without crisis: Secondary | ICD-10-CM

## 2016-05-30 DIAGNOSIS — N183 Chronic kidney disease, stage 3 unspecified: Secondary | ICD-10-CM

## 2016-05-30 MED ORDER — INFLUENZA VAC SPLIT QUAD 0.5 ML IM SUSY
0.5000 mL | PREFILLED_SYRINGE | Freq: Once | INTRAMUSCULAR | Status: AC
  Administered 2016-05-30: 0.5 mL via INTRAMUSCULAR
  Filled 2016-05-30: qty 0.5

## 2016-05-30 MED ORDER — SODIUM CHLORIDE 0.9 % IV SOLN
510.0000 mg | Freq: Once | INTRAVENOUS | Status: DC
  Administered 2016-05-30: 510 mg via INTRAVENOUS
  Filled 2016-05-30: qty 17

## 2016-05-30 MED ORDER — SODIUM CHLORIDE 0.9 % IV SOLN
Freq: Once | INTRAVENOUS | Status: DC
  Administered 2016-05-30: 12:00:00 via INTRAVENOUS

## 2016-05-30 NOTE — Patient Instructions (Signed)

## 2016-06-07 ENCOUNTER — Ambulatory Visit (HOSPITAL_BASED_OUTPATIENT_CLINIC_OR_DEPARTMENT_OTHER): Payer: Medicare Other

## 2016-06-07 ENCOUNTER — Encounter: Payer: Self-pay | Admitting: Family

## 2016-06-07 ENCOUNTER — Other Ambulatory Visit (HOSPITAL_BASED_OUTPATIENT_CLINIC_OR_DEPARTMENT_OTHER): Payer: Medicare Other

## 2016-06-07 ENCOUNTER — Ambulatory Visit (HOSPITAL_BASED_OUTPATIENT_CLINIC_OR_DEPARTMENT_OTHER): Payer: Medicare Other | Admitting: Family

## 2016-06-07 VITALS — BP 129/69 | HR 81 | Temp 98.0°F | Resp 16 | Ht 65.0 in | Wt 185.0 lb

## 2016-06-07 DIAGNOSIS — D631 Anemia in chronic kidney disease: Secondary | ICD-10-CM

## 2016-06-07 DIAGNOSIS — N189 Chronic kidney disease, unspecified: Secondary | ICD-10-CM | POA: Diagnosis present

## 2016-06-07 DIAGNOSIS — N184 Chronic kidney disease, stage 4 (severe): Secondary | ICD-10-CM

## 2016-06-07 DIAGNOSIS — D571 Sickle-cell disease without crisis: Secondary | ICD-10-CM

## 2016-06-07 DIAGNOSIS — D509 Iron deficiency anemia, unspecified: Secondary | ICD-10-CM

## 2016-06-07 DIAGNOSIS — D696 Thrombocytopenia, unspecified: Secondary | ICD-10-CM

## 2016-06-07 DIAGNOSIS — D732 Chronic congestive splenomegaly: Secondary | ICD-10-CM | POA: Diagnosis not present

## 2016-06-07 DIAGNOSIS — D5720 Sickle-cell/Hb-C disease without crisis: Secondary | ICD-10-CM

## 2016-06-07 LAB — COMPREHENSIVE METABOLIC PANEL
ALT: 12 U/L (ref 0–55)
ANION GAP: 12 meq/L — AB (ref 3–11)
AST: 14 U/L (ref 5–34)
Albumin: 3.8 g/dL (ref 3.5–5.0)
Alkaline Phosphatase: 111 U/L (ref 40–150)
BILIRUBIN TOTAL: 1 mg/dL (ref 0.20–1.20)
BUN: 36.3 mg/dL — ABNORMAL HIGH (ref 7.0–26.0)
CHLORIDE: 111 meq/L — AB (ref 98–109)
CO2: 16 meq/L — AB (ref 22–29)
CREATININE: 1.8 mg/dL — AB (ref 0.6–1.1)
Calcium: 8.7 mg/dL (ref 8.4–10.4)
EGFR: 31 mL/min/{1.73_m2} — AB (ref >90)
GLUCOSE: 193 mg/dL — AB (ref 70–140)
Potassium: 4 mEq/L (ref 3.5–5.1)
SODIUM: 139 meq/L (ref 136–145)
TOTAL PROTEIN: 7.1 g/dL (ref 6.4–8.3)

## 2016-06-07 LAB — CBC WITH DIFFERENTIAL (CANCER CENTER ONLY)
BASO#: 0.1 10*3/uL (ref 0.0–0.2)
BASO%: 1 % (ref 0.0–2.0)
EOS%: 1.4 % (ref 0.0–7.0)
Eosinophils Absolute: 0.1 10*3/uL (ref 0.0–0.5)
HCT: 25.4 % — ABNORMAL LOW (ref 34.8–46.6)
HGB: 8.5 g/dL — ABNORMAL LOW (ref 11.6–15.9)
LYMPH#: 2.4 10*3/uL (ref 0.9–3.3)
LYMPH%: 28.5 % (ref 14.0–48.0)
MCH: 25.7 pg — ABNORMAL LOW (ref 26.0–34.0)
MCHC: 33.5 g/dL (ref 32.0–36.0)
MCV: 77 fL — ABNORMAL LOW (ref 81–101)
MONO#: 0.7 10*3/uL (ref 0.1–0.9)
MONO%: 8.8 % (ref 0.0–13.0)
NEUT#: 5.1 10*3/uL (ref 1.5–6.5)
NEUT%: 60.3 % (ref 39.6–80.0)
PLATELETS: 135 10*3/uL — AB (ref 145–400)
RBC: 3.31 10*6/uL — AB (ref 3.70–5.32)
RDW: 22.7 % — AB (ref 11.1–15.7)
WBC: 8.4 10*3/uL (ref 3.9–10.0)

## 2016-06-07 LAB — IRON AND TIBC
%SAT: 42 % (ref 21–57)
IRON: 90 ug/dL (ref 41–142)
TIBC: 215 ug/dL — AB (ref 236–444)
UIBC: 125 ug/dL (ref 120–384)

## 2016-06-07 LAB — FERRITIN: Ferritin: 4791 ng/ml — ABNORMAL HIGH (ref 9–269)

## 2016-06-07 LAB — TECHNOLOGIST REVIEW CHCC SATELLITE

## 2016-06-07 MED ORDER — DARBEPOETIN ALFA 300 MCG/0.6ML IJ SOSY
PREFILLED_SYRINGE | INTRAMUSCULAR | Status: AC
  Filled 2016-06-07: qty 0.6

## 2016-06-07 MED ORDER — DARBEPOETIN ALFA 300 MCG/0.6ML IJ SOSY
300.0000 ug | PREFILLED_SYRINGE | Freq: Once | INTRAMUSCULAR | Status: AC
  Administered 2016-06-07: 300 ug via SUBCUTANEOUS

## 2016-06-07 NOTE — Progress Notes (Signed)
Hematology and Oncology Follow Up Visit  Rachel Palmer [id number] Dec 03, 1940 75 y.o. 06/07/2016   Principle Diagnosis:  1. Sickle cell disease 2. 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 Chronic splenomegaly 4. Chronic thrombocytopenia  5. Iron deficiency anemia  Current Therapy:   Aranesp 300 mcg for Hct < 10 IV iron as indicated - last received in Feb 2017    Interim History:  Rachel Palmer is here today for a follow-up. She states that for several days after she received Feraheme last week she felt better but is now symptomatic again with fatigue and palpitations.  Her Hgb is now 8.5 with an MCV of 77. She is taking her folic acid daily as prescribed.  Her platelet count is up to 135. No episodes of bleeding, bruising or petechiae.  No fever, chills, n/v, cough, rash, ice cravings, dizziness, chest pain, palpitations, abdominal pain or changes in her bowel or bladder habits. Her SOB with exertion is unchanged.  No lymphadenopathy found on exam.  No swelling or numbness in her extremities. The mild tingling in her fingertips that comes and goes is unchanged. She has had no syncopal episodes or falls.  Her appetite continues to come and go. She states that she is on fluid restrictions but is able to stay hydrated. Her weight is stable.   Medications:    Medication List       Accurate as of 06/07/16 11:34 AM. Always use your most recent med list.          albuterol 108 (90 Base) MCG/ACT inhaler Commonly known as:  PROVENTIL HFA;VENTOLIN HFA Inhale 2 puffs into the lungs every 6 (six) hours as needed for wheezing or shortness of breath.   BREO ELLIPTA IN Inhale into the lungs daily.   cetirizine 10 MG tablet Commonly known as:  ZYRTEC Take 10 mg by mouth daily.   folic acid 1 MG tablet Commonly known as:  FOLVITE Take 2 tablets (2 mg total) by mouth daily.   furosemide 20 MG tablet Commonly known as:  LASIX Take 20 mg by mouth as needed for fluid.     hydrochlorothiazide 25 MG tablet Commonly known as:  HYDRODIURIL Take 25 mg by mouth daily.   insulin NPH-regular Human (70-30) 100 UNIT/ML injection Commonly known as:  NOVOLIN 70/30 Inject into the skin.   metoprolol succinate 100 MG 24 hr tablet Commonly known as:  TOPROL-XL Take 100 mg by mouth daily. Take with or immediately following a meal.   montelukast 10 MG tablet Commonly known as:  SINGULAIR Take 10 mg by mouth every morning.   multivitamin with minerals Tabs tablet Take 1 tablet by mouth daily.   omeprazole 40 MG capsule Commonly known as:  PRILOSEC Take 40 mg by mouth daily.   oxycodone 5 MG capsule Commonly known as:  OXY-IR Take 1 capsule (5 mg total) by mouth every 4 (four) hours as needed for pain.   ramipril 10 MG capsule Commonly known as:  ALTACE Take 10 mg by mouth.   sertraline 100 MG tablet Commonly known as:  ZOLOFT Take 100 mg by mouth.       Allergies:  Allergies  Allergen Reactions  . Keflex [Cephalexin] Hives  . Codeine Hives  . Heparin Other (See Comments)    Other Reaction: thrombocytopenia  . Heparin [employer]  . Iohexol Hives    Synthetic xray dye=iohexol   . Ioxaglate Hives  . Ivp Dye [Iodinated Diagnostic Agents] Hives  . Propoxyphene  Other (See Comments)    Loopy  . Tape Rash    Skin peels Skin peels    Past Medical History, Surgical history, Social history, and Family History were reviewed and updated.  Review of Systems: All other 10 point review of systems is negative.   Physical Exam:  vitals were not taken for this visit.  Wt Readings from Last 3 Encounters:  05/26/16 190 lb 11.2 oz (86.5 kg)  05/19/16 187 lb (84.8 kg)  03/03/16 192 lb (87.1 kg)    Ocular: Sclerae unicteric, pupils equal, round and reactive to light Ear-nose-throat: Oropharynx clear, dentition fair Lymphatic: No cervical supraclavicular or axillary adenopathy Lungs no rales or rhonchi, good excursion bilaterally Heart  regular rate and rhythm, no murmur appreciated Abd soft, right lower quadrant slightly tender, positive bowel sounds, no liver or spleen tip found on exam, no fluid wave MSK no focal spinal tenderness, no joint edema Neuro: non-focal, well-oriented, appropriate affect Breasts: Deferred  Lab Results  Component Value Date   WBC 17.0 (H) 05/26/2016   HGB 7.1 (L) 05/26/2016   HCT 20.3 (L) 05/26/2016   MCV 72.8 (L) 05/26/2016   PLT 106 (L) 05/26/2016   Lab Results  Component Value Date   FERRITIN 1,567 (H) 05/26/2016   IRON 19 (L) 05/26/2016   TIBC 190 (L) 05/26/2016   UIBC 171 05/26/2016   IRONPCTSAT 10 (L) 05/26/2016   Lab Results  Component Value Date   RETICCTPCT 5.6 (H) 05/26/2016   RBC 2.90 (L) 05/26/2016   RETICCTABS 135.0 09/16/2015   No results found for: KPAFRELGTCHN, LAMBDASER, KAPLAMBRATIO No results found for: IGGSERUM, IGA, IGMSERUM No results found for: Odetta Pink, SPEI   Chemistry      Component Value Date/Time   NA 141 05/26/2016 0408   NA 141 05/03/2016 1417   NA 143 03/03/2016 0948   K 4.1 05/26/2016 0408   K 3.6 05/03/2016 1417   K 3.9 03/03/2016 0948   CL 118 (H) 05/26/2016 0408   CL 109 (H) 05/03/2016 1417   CL 112 (H) 01/02/2013 1433   CO2 16 (L) 05/26/2016 0408   CO2 22 05/03/2016 1417   CO2 18 (L) 03/03/2016 0948   BUN 38 (H) 05/26/2016 0408   BUN 14 05/03/2016 1417   BUN 28.0 (H) 03/03/2016 0948   CREATININE 1.60 (H) 05/26/2016 0408   CREATININE 1.46 (H) 05/03/2016 1417   CREATININE 1.8 (H) 03/03/2016 0948      Component Value Date/Time   CALCIUM 8.1 (L) 05/26/2016 0408   CALCIUM 7.8 (L) 05/03/2016 1417   CALCIUM 8.5 03/03/2016 0948   ALKPHOS 80 05/25/2016 2143   ALKPHOS 104 05/03/2016 1417   ALKPHOS 90 03/03/2016 0948   AST 29 05/25/2016 2143   AST 10 05/03/2016 1417   AST 15 03/03/2016 0948   ALT 20 05/25/2016 2143   ALT 8 05/03/2016 1417   ALT 11 03/03/2016 0948   BILITOT  1.3 (H) 05/25/2016 2143   BILITOT 0.6 05/03/2016 1417   BILITOT 1.61 (H) 03/03/2016 0948     Impression and Plan: [name] is a very pleasant 75 yo African American female with multiple hematologic issues including sickle cell disease, anemia of chronic renal failure and thrombocytopenia. Her Hgb improved to 8.5 with an MCV of 77. She is taking her folic acid daily as prescribed.  She is still symptomatic with fatigue and occasional palpitations.  She will get Aranesp today. We will see what her iron studies  show and bring her back in later this week for an infusion if needed.  We will plan to see her back in 2 weeks for repeat labs and follow-up.   She will contact us with any questions or concerns. We can certainly see her sooner if need be.   Eliezer Bottom, NP 9/20/201711:34 AM

## 2016-06-07 NOTE — Patient Instructions (Signed)
Darbepoetin Alfa injection What is this medicine? DARBEPOETIN ALFA (dar be POE e tin AL fa) helps your body make more red blood cells. It is used to treat anemia caused by chronic kidney failure and chemotherapy. This medicine may be used for other purposes; ask your health care provider or pharmacist if you have questions. What should I tell my health care provider before I take this medicine? They need to know if you have any of these conditions: -blood clotting disorders or history of blood clots -cancer patient not on chemotherapy -cystic fibrosis -heart disease, such as angina, heart failure, or a history of a heart attack -hemoglobin level of 12 g/dL or greater -high blood pressure -low levels of folate, iron, or vitamin B12 -seizures -an unusual or allergic reaction to darbepoetin, erythropoietin, albumin, hamster proteins, latex, other medicines, foods, dyes, or preservatives -pregnant or trying to get pregnant -breast-feeding How should I use this medicine? This medicine is for injection into a vein or under the skin. It is usually given by a health care professional in a hospital or clinic setting. If you get this medicine at home, you will be taught how to prepare and give this medicine. Do not shake the solution before you withdraw a dose. Use exactly as directed. Take your medicine at regular intervals. Do not take your medicine more often than directed. It is important that you put your used needles and syringes in a special sharps container. Do not put them in a trash can. If you do not have a sharps container, call your pharmacist or healthcare provider to get one. Talk to your pediatrician regarding the use of this medicine in children. While this medicine may be used in children as young as 1 year for selected conditions, precautions do apply. Overdosage: If you think you have taken too much of this medicine contact a poison control center or emergency room at once. NOTE:  This medicine is only for you. Do not share this medicine with others. What if I miss a dose? If you miss a dose, take it as soon as you can. If it is almost time for your next dose, take only that dose. Do not take double or extra doses. What may interact with this medicine? Do not take this medicine with any of the following medications: -epoetin alfa This list may not describe all possible interactions. Give your health care provider a list of all the medicines, herbs, non-prescription drugs, or dietary supplements you use. Also tell them if you smoke, drink alcohol, or use illegal drugs. Some items may interact with your medicine. What should I watch for while using this medicine? Visit your prescriber or health care professional for regular checks on your progress and for the needed blood tests and blood pressure measurements. It is especially important for the doctor to make sure your hemoglobin level is in the desired range, to limit the risk of potential side effects and to give you the best benefit. Keep all appointments for any recommended tests. Check your blood pressure as directed. Ask your doctor what your blood pressure should be and when you should contact him or her. As your body makes more red blood cells, you may need to take iron, folic acid, or vitamin B supplements. Ask your doctor or health care provider which products are right for you. If you have kidney disease continue dietary restrictions, even though this medication can make you feel better. Talk with your doctor or health care professional about the   foods you eat and the vitamins that you take. What side effects may I notice from receiving this medicine? Side effects that you should report to your doctor or health care professional as soon as possible: -allergic reactions like skin rash, itching or hives, swelling of the face, lips, or tongue -breathing problems -changes in vision -chest pain -confusion, trouble speaking  or understanding -feeling faint or lightheaded, falls -high blood pressure -muscle aches or pains -pain, swelling, warmth in the leg -rapid weight gain -severe headaches -sudden numbness or weakness of the face, arm or leg -trouble walking, dizziness, loss of balance or coordination -seizures (convulsions) -swelling of the ankles, feet, hands -unusually weak or tired Side effects that usually do not require medical attention (report to your doctor or health care professional if they continue or are bothersome): -diarrhea -fever, chills (flu-like symptoms) -headaches -nausea, vomiting -redness, stinging, or swelling at site where injected This list may not describe all possible side effects. Call your doctor for medical advice about side effects. You may report side effects to FDA at 1-800-FDA-1088. Where should I keep my medicine? Keep out of the reach of children. Store in a refrigerator between 2 and 8 degrees C (36 and 46 degrees F). Do not freeze. Do not shake. Throw away any unused portion if using a single-dose vial. Throw away any unused medicine after the expiration date. NOTE: This sheet is a summary. It may not cover all possible information. If you have questions about this medicine, talk to your doctor, pharmacist, or health care provider.    2016, Elsevier/Gold Standard. (2008-08-18 10:23:57)  

## 2016-06-08 ENCOUNTER — Encounter: Payer: Self-pay | Admitting: *Deleted

## 2016-06-08 LAB — RETICULOCYTES: Reticulocyte Count: 5.7 % — ABNORMAL HIGH (ref 0.6–2.6)

## 2016-06-21 ENCOUNTER — Ambulatory Visit (HOSPITAL_BASED_OUTPATIENT_CLINIC_OR_DEPARTMENT_OTHER): Payer: Medicare Other

## 2016-06-21 ENCOUNTER — Encounter: Payer: Self-pay | Admitting: *Deleted

## 2016-06-21 ENCOUNTER — Other Ambulatory Visit (HOSPITAL_BASED_OUTPATIENT_CLINIC_OR_DEPARTMENT_OTHER): Payer: Medicare Other

## 2016-06-21 ENCOUNTER — Ambulatory Visit (HOSPITAL_BASED_OUTPATIENT_CLINIC_OR_DEPARTMENT_OTHER): Payer: Medicare Other | Admitting: Hematology & Oncology

## 2016-06-21 ENCOUNTER — Encounter: Payer: Self-pay | Admitting: Hematology & Oncology

## 2016-06-21 VITALS — BP 163/88 | HR 92 | Temp 98.2°F | Resp 16 | Ht 65.0 in | Wt 187.4 lb

## 2016-06-21 DIAGNOSIS — N183 Chronic kidney disease, stage 3 unspecified: Secondary | ICD-10-CM

## 2016-06-21 DIAGNOSIS — D696 Thrombocytopenia, unspecified: Secondary | ICD-10-CM | POA: Diagnosis not present

## 2016-06-21 DIAGNOSIS — N184 Chronic kidney disease, stage 4 (severe): Secondary | ICD-10-CM

## 2016-06-21 DIAGNOSIS — D571 Sickle-cell disease without crisis: Secondary | ICD-10-CM | POA: Diagnosis present

## 2016-06-21 DIAGNOSIS — R161 Splenomegaly, not elsewhere classified: Secondary | ICD-10-CM

## 2016-06-21 DIAGNOSIS — D631 Anemia in chronic kidney disease: Secondary | ICD-10-CM

## 2016-06-21 DIAGNOSIS — D509 Iron deficiency anemia, unspecified: Secondary | ICD-10-CM | POA: Diagnosis not present

## 2016-06-21 DIAGNOSIS — D5720 Sickle-cell/Hb-C disease without crisis: Secondary | ICD-10-CM

## 2016-06-21 DIAGNOSIS — D508 Other iron deficiency anemias: Secondary | ICD-10-CM

## 2016-06-21 LAB — CBC WITH DIFFERENTIAL (CANCER CENTER ONLY)
BASO#: 0 10*3/uL (ref 0.0–0.2)
BASO%: 0.7 % (ref 0.0–2.0)
EOS%: 2.2 % (ref 0.0–7.0)
Eosinophils Absolute: 0.1 10*3/uL (ref 0.0–0.5)
HCT: 28.4 % — ABNORMAL LOW (ref 34.8–46.6)
HEMOGLOBIN: 9.6 g/dL — AB (ref 11.6–15.9)
LYMPH#: 1.8 10*3/uL (ref 0.9–3.3)
LYMPH%: 31.2 % (ref 14.0–48.0)
MCH: 26.2 pg (ref 26.0–34.0)
MCHC: 33.8 g/dL (ref 32.0–36.0)
MCV: 77 fL — ABNORMAL LOW (ref 81–101)
MONO#: 0.5 10*3/uL (ref 0.1–0.9)
MONO%: 8.4 % (ref 0.0–13.0)
NEUT%: 57.5 % (ref 39.6–80.0)
NEUTROS ABS: 3.3 10*3/uL (ref 1.5–6.5)
Platelets: 62 10*3/uL — ABNORMAL LOW (ref 145–400)
RBC: 3.67 10*6/uL — ABNORMAL LOW (ref 3.70–5.32)
RDW: 20.6 % — AB (ref 11.1–15.7)
WBC: 5.8 10*3/uL (ref 3.9–10.0)

## 2016-06-21 LAB — COMPREHENSIVE METABOLIC PANEL
ALT: <9 U/L (ref 0–55)
AST: 12 U/L (ref 5–34)
Albumin: 3.9 g/dL (ref 3.5–5.0)
Alkaline Phosphatase: 110 U/L (ref 40–150)
Anion Gap: 11 mEq/L (ref 3–11)
BUN: 21.9 mg/dL (ref 7.0–26.0)
CHLORIDE: 114 meq/L — AB (ref 98–109)
CO2: 19 meq/L — AB (ref 22–29)
Calcium: 8.8 mg/dL (ref 8.4–10.4)
Creatinine: 1.5 mg/dL — ABNORMAL HIGH (ref 0.6–1.1)
EGFR: 39 mL/min/{1.73_m2} — ABNORMAL LOW (ref >90)
GLUCOSE: 148 mg/dL — AB (ref 70–140)
POTASSIUM: 4.1 meq/L (ref 3.5–5.1)
SODIUM: 143 meq/L (ref 136–145)
Total Bilirubin: 0.95 mg/dL (ref 0.20–1.20)
Total Protein: 7.2 g/dL (ref 6.4–8.3)

## 2016-06-21 LAB — FERRITIN

## 2016-06-21 LAB — IRON AND TIBC
%SAT: 34 % (ref 21–57)
Iron: 73 ug/dL (ref 41–142)
TIBC: 216 ug/dL — ABNORMAL LOW (ref 236–444)
UIBC: 143 ug/dL (ref 120–384)

## 2016-06-21 MED ORDER — DARBEPOETIN ALFA 300 MCG/0.6ML IJ SOSY
PREFILLED_SYRINGE | INTRAMUSCULAR | Status: AC
  Filled 2016-06-21: qty 0.6

## 2016-06-21 MED ORDER — DARBEPOETIN ALFA 300 MCG/0.6ML IJ SOSY
300.0000 ug | PREFILLED_SYRINGE | Freq: Once | INTRAMUSCULAR | Status: AC
  Administered 2016-06-21: 300 ug via SUBCUTANEOUS

## 2016-06-21 NOTE — Progress Notes (Signed)
Hematology and Oncology Follow Up Visit  Rachel Palmer [id number] Jun 02, 1941 75 y.o. 06/21/2016   Principle Diagnosis:  1. Sickle cell disease 2. 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 Chronic splenomegaly 4. Chronic thrombocytopenia  5. Iron deficiency anemia  Current Therapy:   Aranesp 300 mcg for Hct < 10 IV iron as indicated - last received in 05/2016    Interim History:  Rachel Palmer is here today for a follow-up. She is looking much better. She feels better. She has a little more energy. Her effort hemoglobin is improving. This typically improves once we get her iron and give her Aranesp.   Back in early September, her iron saturation was only 10%. Her total iron was 19. She got a dose of Feraheme. A few weeks to let her, her iron saturation was 42% and her iron was 90. Her ferritin has been quite elevated which is more of an acute phase reactant.   She is trying to watch her blood sugars. They typically have been on the high side. She has chronic renal insufficiency. This is from her diabetes.   She has not had any fever. She has had no rashes. She's not had any obvious bleeding.   She's had no cough. She's had no bowel sores. She's had no headache.   She has not been able to travel anywhere. She generally goes up to [place] with some of her family lives. Medications:    Medication List       Accurate as of 06/21/16 12:08 PM. Always use your most recent med list.          albuterol 108 (90 Base) MCG/ACT inhaler Commonly known as:  PROVENTIL HFA;VENTOLIN HFA Inhale 2 puffs into the lungs every 6 (six) hours as needed for wheezing or shortness of breath.   BREO ELLIPTA IN Inhale into the lungs daily.   cetirizine 10 MG tablet Commonly known as:  ZYRTEC Take 10 mg by mouth daily.   folic acid 1 MG tablet Commonly known as:  FOLVITE Take 2 tablets (2 mg total) by mouth daily.   furosemide 20 MG tablet Commonly known as:  LASIX Take 20 mg by mouth as  needed for fluid.   hydrochlorothiazide 25 MG tablet Commonly known as:  HYDRODIURIL Take 25 mg by mouth daily.   insulin NPH-regular Human (70-30) 100 UNIT/ML injection Commonly known as:  NOVOLIN 70/30 Inject into the skin.   metoprolol succinate 100 MG 24 hr tablet Commonly known as:  TOPROL-XL Take 100 mg by mouth daily. Take with or immediately following a meal.   montelukast 10 MG tablet Commonly known as:  SINGULAIR Take 10 mg by mouth every morning.   multivitamin with minerals Tabs tablet Take 1 tablet by mouth daily.   omeprazole 40 MG capsule Commonly known as:  PRILOSEC Take 40 mg by mouth daily.   oxycodone 5 MG capsule Commonly known as:  OXY-IR Take 1 capsule (5 mg total) by mouth every 4 (four) hours as needed for pain.   ramipril 10 MG capsule Commonly known as:  ALTACE Take 10 mg by mouth.   sertraline 100 MG tablet Commonly known as:  ZOLOFT Take 100 mg by mouth.       Allergies:  Allergies  Allergen Reactions  . Keflex [Cephalexin] Hives  . Codeine Hives  . Heparin Other (See Comments)    Other Reaction: thrombocytopenia  . Heparin [facility]  . Iohexol Hives    Synthetic xray  dye=iohexol   . Ioxaglate Hives  . Ivp Dye [Iodinated Diagnostic Agents] Hives  . Propoxyphene Other (See Comments)    Loopy  . Tape Rash    Skin peels Skin peels    Past Medical History, Surgical history, Social history, and Family History were reviewed and updated.  Review of Systems: All other 10 point review of systems is negative.   Physical Exam:  height is 5\' 5"  (1.651 m) and weight is 187 lb 6.4 oz (85 kg). Her oral temperature is 98.2 F (36.8 C). Her blood pressure is 163/88 (abnormal) and her pulse is 92. Her respiration is 16.   Wt Readings from Last 3 Encounters:  06/21/16 187 lb 6.4 oz (85 kg)  06/07/16 185 lb (83.9 kg)  05/26/16 190 lb 11.2 oz (86.5 kg)    Ocular: Sclerae unicteric, pupils equal, round and reactive to  light Ear-nose-throat: Oropharynx clear, dentition fair Lymphatic: No cervical supraclavicular or axillary adenopathy Lungs no rales or rhonchi, good excursion bilaterally Heart regular rate and rhythm, no murmur appreciated Abd soft, right lower quadrant slightly tender, positive bowel sounds, no liver or spleen tip found on exam, no fluid wave MSK no focal spinal tenderness, no joint edema Neuro: non-focal, well-oriented, appropriate affect Breasts: Deferred  Lab Results  Component Value Date   WBC 5.8 06/21/2016   HGB 9.6 (L) 06/21/2016   HCT 28.4 (L) 06/21/2016   MCV 77 (L) 06/21/2016   PLT 62 Platelet count confirmed by slide estimate (L) 06/21/2016   Lab Results  Component Value Date   FERRITIN 4,791 (H) 06/07/2016   IRON 90 06/07/2016   TIBC 215 (L) 06/07/2016   UIBC 125 06/07/2016   IRONPCTSAT 42 06/07/2016   Lab Results  Component Value Date   RETICCTPCT 5.6 (H) 05/26/2016   RBC 3.67 (L) 06/21/2016   RETICCTABS 135.0 09/16/2015   No results found for: KPAFRELGTCHN, LAMBDASER, KAPLAMBRATIO No results found for: IGGSERUM, IGA, IGMSERUM No results found for: Kathrynn Ducking, MSPIKE, SPEI   Chemistry      Component Value Date/Time   NA 139 06/07/2016 1109   K 4.0 06/07/2016 1109   CL 118 (H) 05/26/2016 0408   CL 109 (H) 05/03/2016 1417   CL 112 (H) 01/02/2013 1433   CO2 16 (L) 06/07/2016 1109   BUN 36.3 (H) 06/07/2016 1109   CREATININE 1.8 (H) 06/07/2016 1109      Component Value Date/Time   CALCIUM 8.7 06/07/2016 1109   ALKPHOS 111 06/07/2016 1109   AST 14 06/07/2016 1109   ALT 12 06/07/2016 1109   BILITOT 1.00 06/07/2016 1109     Impression and Plan: Rachel Palmer is a very pleasant 75 yo African American female with multiple hematologic issues including sickle cell disease, anemia of chronic renal failure and thrombocytopenia. She does have splenomegaly. This is part of her hemoglobin [place] disease.  We will go ahead  and give her Aranesp today. I don't think she needs any iron. We'll check her iron studies.  We need to have her come back every 2 weeks for the present time. I think this is necessary until we get her blood count up to above 10 or 11. I think if we get her hemoglobin up that high, that she will be feeling a whole lot better.   Volanda Napoleon, MD 10/4/201712:08 PM

## 2016-06-21 NOTE — Patient Instructions (Signed)
Darbepoetin Alfa injection What is this medicine? DARBEPOETIN ALFA (dar be POE e tin AL fa) helps your body make more red blood cells. It is used to treat anemia caused by chronic kidney failure and chemotherapy. This medicine may be used for other purposes; ask your health care provider or pharmacist if you have questions. What should I tell my health care provider before I take this medicine? They need to know if you have any of these conditions: -blood clotting disorders or history of blood clots -cancer patient not on chemotherapy -cystic fibrosis -heart disease, such as angina, heart failure, or a history of a heart attack -hemoglobin level of 12 g/dL or greater -high blood pressure -low levels of folate, iron, or vitamin B12 -seizures -an unusual or allergic reaction to darbepoetin, erythropoietin, albumin, hamster proteins, latex, other medicines, foods, dyes, or preservatives -pregnant or trying to get pregnant -breast-feeding How should I use this medicine? This medicine is for injection into a vein or under the skin. It is usually given by a health care professional in a hospital or clinic setting. If you get this medicine at home, you will be taught how to prepare and give this medicine. Do not shake the solution before you withdraw a dose. Use exactly as directed. Take your medicine at regular intervals. Do not take your medicine more often than directed. It is important that you put your used needles and syringes in a special sharps container. Do not put them in a trash can. If you do not have a sharps container, call your pharmacist or healthcare provider to get one. Talk to your pediatrician regarding the use of this medicine in children. While this medicine may be used in children as young as 1 year for selected conditions, precautions do apply. Overdosage: If you think you have taken too much of this medicine contact a poison control center or emergency room at once. NOTE:  This medicine is only for you. Do not share this medicine with others. What if I miss a dose? If you miss a dose, take it as soon as you can. If it is almost time for your next dose, take only that dose. Do not take double or extra doses. What may interact with this medicine? Do not take this medicine with any of the following medications: -epoetin alfa This list may not describe all possible interactions. Give your health care provider a list of all the medicines, herbs, non-prescription drugs, or dietary supplements you use. Also tell them if you smoke, drink alcohol, or use illegal drugs. Some items may interact with your medicine. What should I watch for while using this medicine? Visit your prescriber or health care professional for regular checks on your progress and for the needed blood tests and blood pressure measurements. It is especially important for the doctor to make sure your hemoglobin level is in the desired range, to limit the risk of potential side effects and to give you the best benefit. Keep all appointments for any recommended tests. Check your blood pressure as directed. Ask your doctor what your blood pressure should be and when you should contact him or her. As your body makes more red blood cells, you may need to take iron, folic acid, or vitamin B supplements. Ask your doctor or health care provider which products are right for you. If you have kidney disease continue dietary restrictions, even though this medication can make you feel better. Talk with your doctor or health care professional about the   foods you eat and the vitamins that you take. What side effects may I notice from receiving this medicine? Side effects that you should report to your doctor or health care professional as soon as possible: -allergic reactions like skin rash, itching or hives, swelling of the face, lips, or tongue -breathing problems -changes in vision -chest pain -confusion, trouble speaking  or understanding -feeling faint or lightheaded, falls -high blood pressure -muscle aches or pains -pain, swelling, warmth in the leg -rapid weight gain -severe headaches -sudden numbness or weakness of the face, arm or leg -trouble walking, dizziness, loss of balance or coordination -seizures (convulsions) -swelling of the ankles, feet, hands -unusually weak or tired Side effects that usually do not require medical attention (report to your doctor or health care professional if they continue or are bothersome): -diarrhea -fever, chills (flu-like symptoms) -headaches -nausea, vomiting -redness, stinging, or swelling at site where injected This list may not describe all possible side effects. Call your doctor for medical advice about side effects. You may report side effects to FDA at 1-800-FDA-1088. Where should I keep my medicine? Keep out of the reach of children. Store in a refrigerator between 2 and 8 degrees C (36 and 46 degrees F). Do not freeze. Do not shake. Throw away any unused portion if using a single-dose vial. Throw away any unused medicine after the expiration date. NOTE: This sheet is a summary. It may not cover all possible information. If you have questions about this medicine, talk to your doctor, pharmacist, or health care provider.    2016, Elsevier/Gold Standard. (2008-08-18 10:23:57)  

## 2016-06-22 LAB — ERYTHROPOIETIN: Erythropoietin: 59.3 m[IU]/mL — ABNORMAL HIGH (ref 2.6–18.5)

## 2016-06-22 LAB — RETICULOCYTES: RETICULOCYTE COUNT: 4.4 % — AB (ref 0.6–2.6)

## 2016-07-05 ENCOUNTER — Ambulatory Visit: Payer: Medicare Other

## 2016-07-05 ENCOUNTER — Other Ambulatory Visit (HOSPITAL_BASED_OUTPATIENT_CLINIC_OR_DEPARTMENT_OTHER): Payer: Medicare Other

## 2016-07-05 ENCOUNTER — Encounter: Payer: Self-pay | Admitting: Family

## 2016-07-05 ENCOUNTER — Ambulatory Visit (HOSPITAL_BASED_OUTPATIENT_CLINIC_OR_DEPARTMENT_OTHER): Payer: Medicare Other | Admitting: Family

## 2016-07-05 VITALS — BP 163/71 | HR 61 | Temp 97.5°F | Resp 16 | Ht 65.0 in | Wt 186.0 lb

## 2016-07-05 DIAGNOSIS — D631 Anemia in chronic kidney disease: Secondary | ICD-10-CM

## 2016-07-05 DIAGNOSIS — D696 Thrombocytopenia, unspecified: Secondary | ICD-10-CM

## 2016-07-05 DIAGNOSIS — D5720 Sickle-cell/Hb-C disease without crisis: Secondary | ICD-10-CM

## 2016-07-05 DIAGNOSIS — N189 Chronic kidney disease, unspecified: Secondary | ICD-10-CM | POA: Diagnosis present

## 2016-07-05 DIAGNOSIS — D508 Other iron deficiency anemias: Secondary | ICD-10-CM

## 2016-07-05 DIAGNOSIS — D571 Sickle-cell disease without crisis: Secondary | ICD-10-CM

## 2016-07-05 DIAGNOSIS — N183 Chronic kidney disease, stage 3 unspecified: Secondary | ICD-10-CM

## 2016-07-05 DIAGNOSIS — N184 Chronic kidney disease, stage 4 (severe): Principal | ICD-10-CM

## 2016-07-05 LAB — CMP (CANCER CENTER ONLY)
ALK PHOS: 90 U/L — AB (ref 26–84)
ALT: 22 U/L (ref 10–47)
AST: 18 U/L (ref 11–38)
Albumin: 4.4 g/dL (ref 3.3–5.5)
BILIRUBIN TOTAL: 1.2 mg/dL (ref 0.20–1.60)
BUN: 24 mg/dL — AB (ref 7–22)
CO2: 24 mEq/L (ref 18–33)
Calcium: 8.5 mg/dL (ref 8.0–10.3)
Chloride: 113 mEq/L — ABNORMAL HIGH (ref 98–108)
Creat: 1.5 mg/dl — ABNORMAL HIGH (ref 0.6–1.2)
GLUCOSE: 124 mg/dL — AB (ref 73–118)
Potassium: 3.9 mEq/L (ref 3.3–4.7)
Sodium: 143 mEq/L (ref 128–145)
Total Protein: 7.4 g/dL (ref 6.4–8.1)

## 2016-07-05 LAB — CBC WITH DIFFERENTIAL (CANCER CENTER ONLY)
BASO#: 0 10*3/uL (ref 0.0–0.2)
BASO%: 0.6 % (ref 0.0–2.0)
EOS%: 1.2 % (ref 0.0–7.0)
Eosinophils Absolute: 0.1 10*3/uL (ref 0.0–0.5)
HEMATOCRIT: 31.7 % — AB (ref 34.8–46.6)
HGB: 11.1 g/dL — ABNORMAL LOW (ref 11.6–15.9)
LYMPH#: 1.8 10*3/uL (ref 0.9–3.3)
LYMPH%: 27.2 % (ref 14.0–48.0)
MCH: 25.6 pg — ABNORMAL LOW (ref 26.0–34.0)
MCHC: 35 g/dL (ref 32.0–36.0)
MCV: 73 fL — ABNORMAL LOW (ref 81–101)
MONO#: 0.5 10*3/uL (ref 0.1–0.9)
MONO%: 7.5 % (ref 0.0–13.0)
NEUT#: 4.3 10*3/uL (ref 1.5–6.5)
NEUT%: 63.5 % (ref 39.6–80.0)
PLATELETS: 87 10*3/uL — AB (ref 145–400)
RBC: 4.34 10*6/uL (ref 3.70–5.32)
RDW: 20.4 % — AB (ref 11.1–15.7)
WBC: 6.8 10*3/uL (ref 3.9–10.0)

## 2016-07-05 LAB — IRON AND TIBC
%SAT: 25 % (ref 21–57)
Iron: 62 ug/dL (ref 41–142)
TIBC: 243 ug/dL (ref 236–444)
UIBC: 181 ug/dL (ref 120–384)

## 2016-07-05 LAB — TECHNOLOGIST REVIEW CHCC SATELLITE

## 2016-07-05 LAB — FERRITIN: Ferritin: 3465 ng/ml — ABNORMAL HIGH (ref 9–269)

## 2016-07-05 NOTE — Progress Notes (Signed)
Hematology and Oncology Follow Up Visit  BRYANNAH GOGUE [id number] 1941/04/19 75 y.o. 07/05/2016   Principle Diagnosis:  1. Sickle cell disease 2. 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 Chronic splenomegaly 4. Chronic thrombocytopenia  5. Iron deficiency anemia  Current Therapy:   Aranesp 300 mcg for Hct < 10 IV iron as indicated - last received in Feb 2017    Interim History:  Ms. Swigart is here today for a follow-up. She had a nice response to the Aranesp she received 2 weeks ago. Her Hgb is 11.1 now with an MCV of 73. She is taking her folic acid daily as prescribed.  Her platelet count is 87. No episodes of bleeding, bruising or petechiae.  No fever, chills, n/v, cough, rash, ice cravings, dizziness, chest pain, palpitations, abdominal pain or changes in bladder habits.  Her SOB with exertion is unchanged. She takes time to rest as needed until this passes.  She has been having diarrhea soon after eating with bloating and states that she will be following up with GI for evaluation and treatment.  No lymphadenopathy found on exam.  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  She currently feels that she has gout in her right big toe and plans to follow up with her PCP, Dr. Lenna Gilford, to have her uric acid checked and for Allopurinol. There is some swelling in her right large toe.  She has had no syncopal episodes or falls.  Her appetite continues to come and go. She is staying hydrated. Her weight is stable.   Medications:    Medication List       Accurate as of 07/05/16 12:06 PM. Always use your most recent med list.          albuterol 108 (90 Base) MCG/ACT inhaler Commonly known as:  PROVENTIL HFA;VENTOLIN HFA Inhale 2 puffs into the lungs every 6 (six) hours as needed for wheezing or shortness of breath.   BREO ELLIPTA IN Inhale into the lungs daily.   cetirizine 10 MG tablet Commonly known as:  ZYRTEC Take 10 mg by mouth daily.   folic acid 1 MG  tablet Commonly known as:  FOLVITE Take 2 tablets (2 mg total) by mouth daily.   furosemide 20 MG tablet Commonly known as:  LASIX Take 20 mg by mouth as needed for fluid.   hydrochlorothiazide 25 MG tablet Commonly known as:  HYDRODIURIL Take 25 mg by mouth daily.   insulin NPH-regular Human (70-30) 100 UNIT/ML injection Commonly known as:  NOVOLIN 70/30 Inject into the skin.   metoprolol succinate 100 MG 24 hr tablet Commonly known as:  TOPROL-XL Take 100 mg by mouth daily. Take with or immediately following a meal.   montelukast 10 MG tablet Commonly known as:  SINGULAIR Take 10 mg by mouth every morning.   multivitamin with minerals Tabs tablet Take 1 tablet by mouth daily.   omeprazole 40 MG capsule Commonly known as:  PRILOSEC Take 40 mg by mouth daily.   oxycodone 5 MG capsule Commonly known as:  OXY-IR Take 1 capsule (5 mg total) by mouth every 4 (four) hours as needed for pain.   ramipril 10 MG capsule Commonly known as:  ALTACE Take 10 mg by mouth.   sertraline 100 MG tablet Commonly known as:  ZOLOFT Take 100 mg by mouth.       Allergies:  Allergies  Allergen Reactions  . Keflex [Cephalexin] Hives  . Codeine Hives  . Heparin Other (See Comments)  Other Reaction: thrombocytopenia  . Heparin [facility]  . Iohexol Hives    Synthetic xray dye=iohexol   . Ioxaglate Hives  . Ivp Dye [Iodinated Diagnostic Agents] Hives  . Propoxyphene Other (See Comments)    Loopy  . Tape Rash    Skin peels Skin peels    Past Medical History, Surgical history, Social history, and Family History were reviewed and updated.  Review of Systems: All other 10 point review of systems is negative.   Physical Exam:  height is 5\' 5"  (1.651 m) and weight is 186 lb (84.4 kg). Her oral temperature is 97.5 F (36.4 C). Her blood pressure is 163/71 (abnormal) and her pulse is 61. Her respiration is 16.   Wt Readings from Last 3 Encounters:  07/05/16 186 lb  (84.4 kg)  06/21/16 187 lb 6.4 oz (85 kg)  06/07/16 185 lb (83.9 kg)    Ocular: Sclerae unicteric, pupils equal, round and reactive to light Ear-nose-throat: Oropharynx clear, dentition fair Lymphatic: No cervical supraclavicular or axillary adenopathy Lungs no rales or rhonchi, good excursion bilaterally Heart regular rate and rhythm, no murmur appreciated Abd soft, nontender, positive bowel sounds, no liver or spleen tip palpated on exam, no fluid wave MSK no focal spinal tenderness, no joint edema Neuro: non-focal, well-oriented, appropriate affect Breasts: Deferred  Lab Results  Component Value Date   WBC 6.8 07/05/2016   HGB 11.1 (L) 07/05/2016   HCT 31.7 (L) 07/05/2016   MCV 73 (L) 07/05/2016   PLT 87 (L) 07/05/2016   Lab Results  Component Value Date   FERRITIN 3,635 (H) 06/21/2016   IRON 73 06/21/2016   TIBC 216 (L) 06/21/2016   UIBC 143 06/21/2016   IRONPCTSAT 34 06/21/2016   Lab Results  Component Value Date   RETICCTPCT 5.6 (H) 05/26/2016   RBC 4.34 07/05/2016   RETICCTABS 135.0 09/16/2015   No results found for: KPAFRELGTCHN, LAMBDASER, KAPLAMBRATIO No results found for: IGGSERUM, IGA, IGMSERUM No results found for: Odetta Pink, SPEI   Chemistry      Component Value Date/Time   NA 143 07/05/2016 1119   NA 143 06/21/2016 1043   K 3.9 07/05/2016 1119   K 4.1 06/21/2016 1043   CL 113 (H) 07/05/2016 1119   CO2 24 07/05/2016 1119   CO2 19 (L) 06/21/2016 1043   BUN 24 (H) 07/05/2016 1119   BUN 21.9 06/21/2016 1043   CREATININE 1.5 (H) 07/05/2016 1119   CREATININE 1.5 (H) 06/21/2016 1043      Component Value Date/Time   CALCIUM 8.5 07/05/2016 1119   CALCIUM 8.8 06/21/2016 1043   ALKPHOS 90 (H) 07/05/2016 1119   ALKPHOS 110 06/21/2016 1043   AST 18 07/05/2016 1119   AST 12 06/21/2016 1043   ALT 22 07/05/2016 1119   ALT <9 06/21/2016 1043   BILITOT 1.20 07/05/2016 1119   BILITOT 0.95 06/21/2016  1043     Impression and Plan: Ms. Meininger is a very pleasant 75 yo African American female with multiple hematologic issues including sickle cell disease, anemia of chronic renal failure and thrombocytopenia. She responded nicely to Aranesp. Her Hgb improved to 11.1 with an MCV of 73. No injection needed at this time.  She will continue taking her folic acid daily as prescribed.  We will see what her iron studies show and bring her back in later this week for an infusion if needed.  We will plan to see her back in 3 weeks  for repeat labs and follow-up.   She will contact us with any questions or concerns. We can certainly see her sooner if need be.   Eliezer Bottom, NP 10/18/201712:06 PM

## 2016-07-06 LAB — RETICULOCYTES: Reticulocyte Count: 3.2 % — ABNORMAL HIGH (ref 0.6–2.6)

## 2016-07-07 LAB — HEMOGLOBINOPATHY EVALUATION
HGB C: 40.6 % — AB
HGB S: 45.7 % — AB
Hemoglobin A2 Quantitation: 4.7 % — ABNORMAL HIGH (ref 0.7–3.1)
Hemoglobin F Quantitation: 1.5 % (ref 0.0–2.0)
Hgb A: 7.5 % — ABNORMAL LOW (ref 94.0–98.0)

## 2016-07-14 ENCOUNTER — Other Ambulatory Visit: Payer: Self-pay | Admitting: Family

## 2016-07-17 ENCOUNTER — Other Ambulatory Visit: Payer: Self-pay | Admitting: Family

## 2016-08-08 ENCOUNTER — Telehealth: Payer: Self-pay | Admitting: *Deleted

## 2016-08-08 NOTE — Telephone Encounter (Signed)
Patient is requesting that recent medical records be sent to her physician in [place]. She is out of town for the holiday season. Records routed to  Dr Malena Peer [phone]

## 2016-12-17 DEATH — deceased

## 2016-12-29 ENCOUNTER — Telehealth: Payer: Self-pay | Admitting: *Deleted

## 2016-12-29 NOTE — Telephone Encounter (Signed)
Medical Records Routed to  [facility] [place] [address]  Signed medical record release form placed in scan bin

## 2017-03-09 IMAGING — CT CT BIOPSY
2 of 4 series · 11 of 27 positions shown, 15 images · non-contrast
Comparison: none

INDICATION: History of sickle cell disease, now with anemia. Please perform
CT-guided bone marrow biopsy and aspiration for tissue diagnostic
purposes.

[Series 2: rtn a/p w/o · axial · non-contrast · 0.74mm/px · z∈[-179,-109]mm · 8 of 19 slices shown]
[im 3/19  soft-tissue]
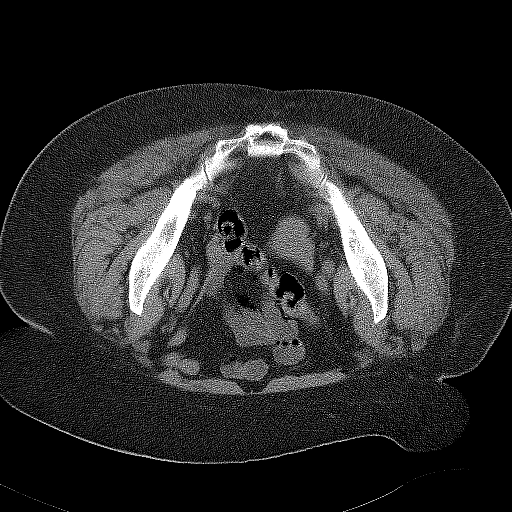
[im 5/19  soft-tissue]
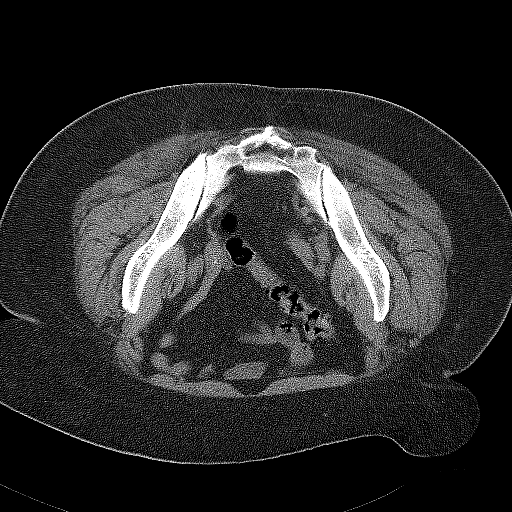
[im 7/19  soft-tissue]
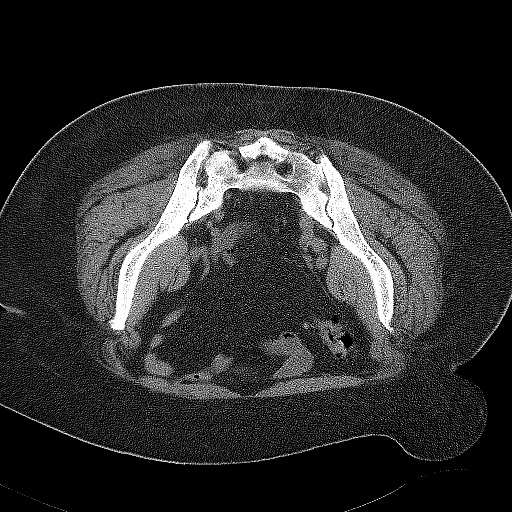
[im 9/19  soft-tissue]
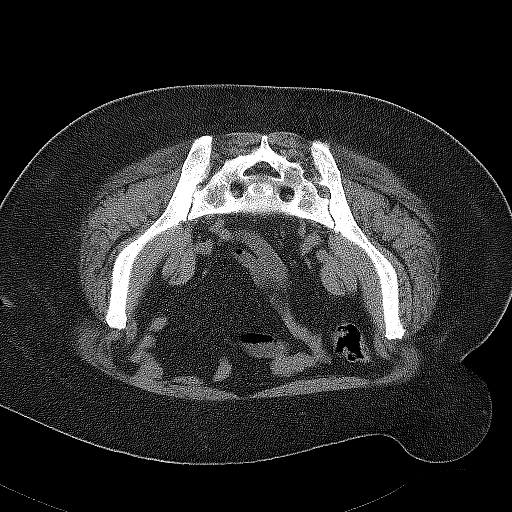
[im 11/19  soft-tissue]
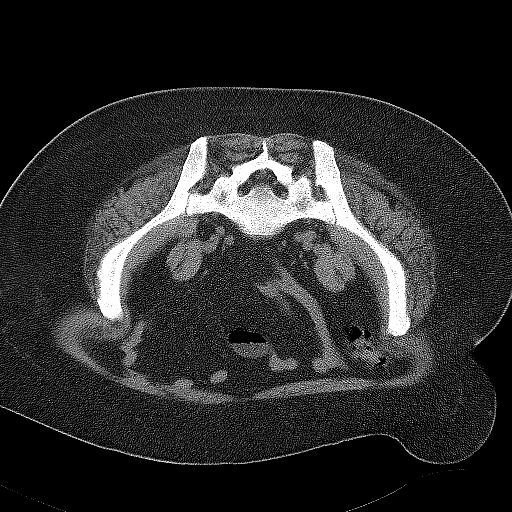
[im 13/19  soft-tissue]
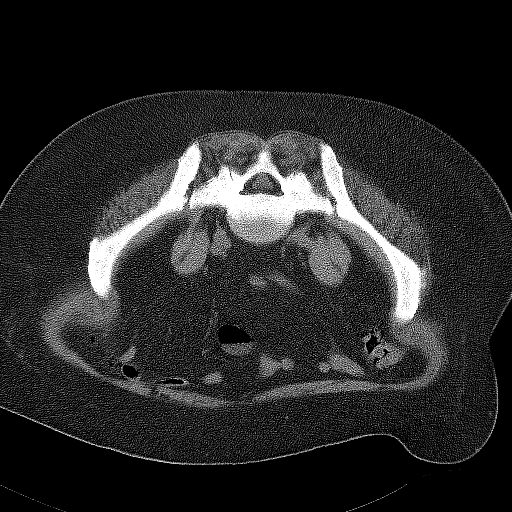
[im 15/19  soft-tissue]
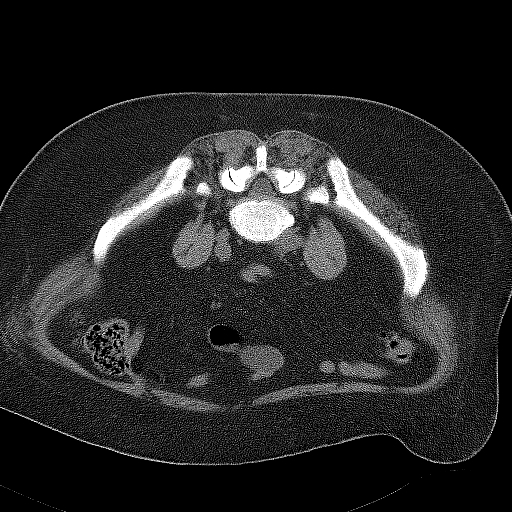
[im 17/19  soft-tissue]
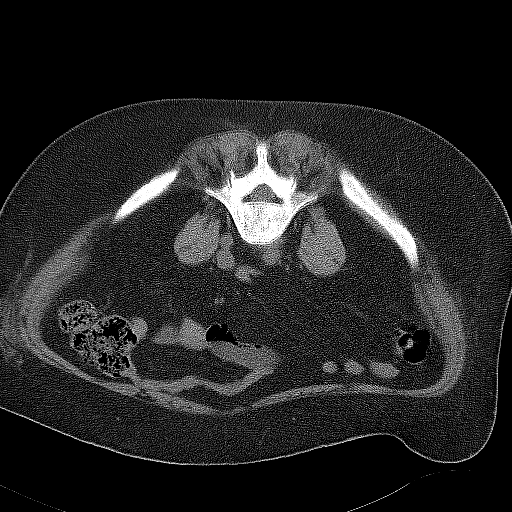

[Series 4: bmbx 5.0 b70s · axial · 0.74mm/px · z∈[-150,-140]mm · 3 of 3 slices shown, 7 images]
[im 1/3  soft-tissue]
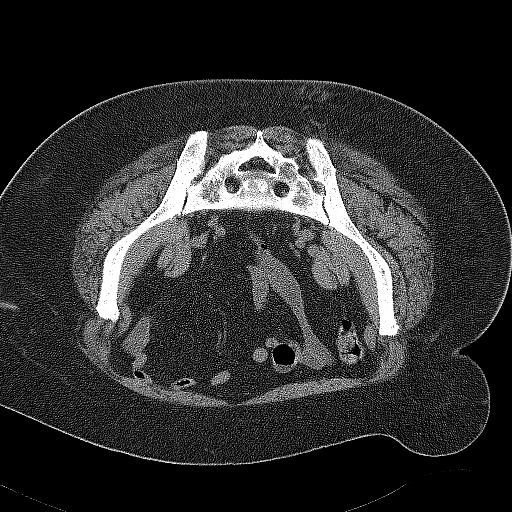
[im 1/3  lung]
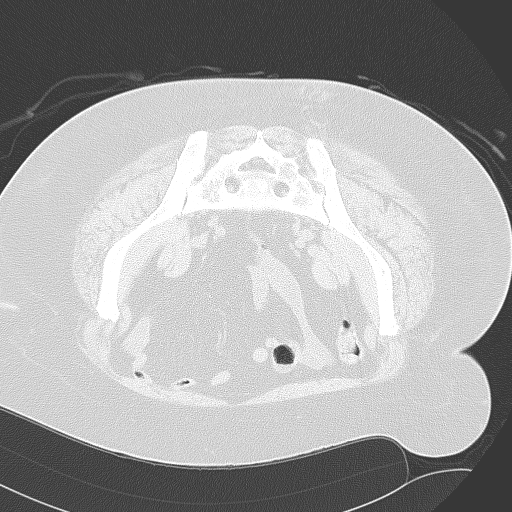
[im 1/3  bone]
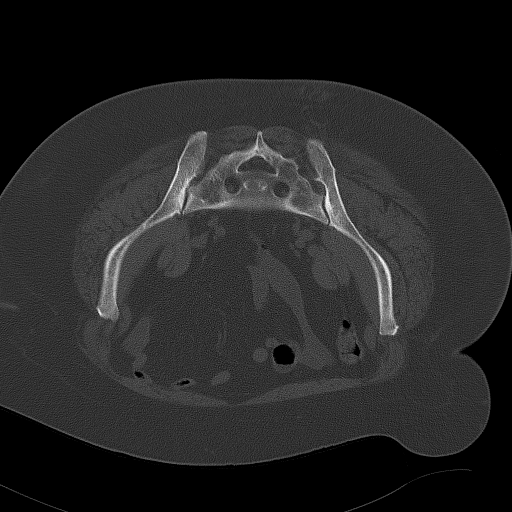
[im 2/3  soft-tissue]
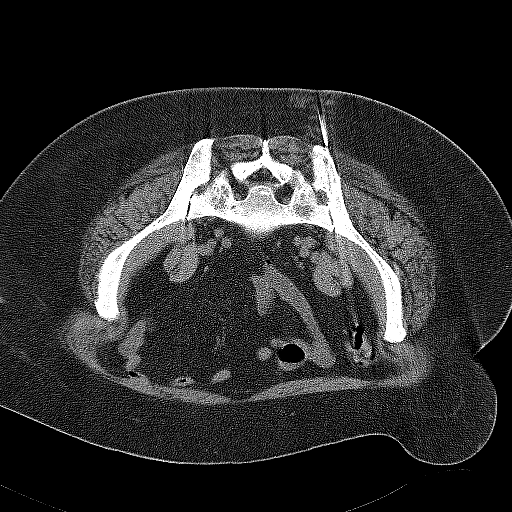
[im 2/3  lung]
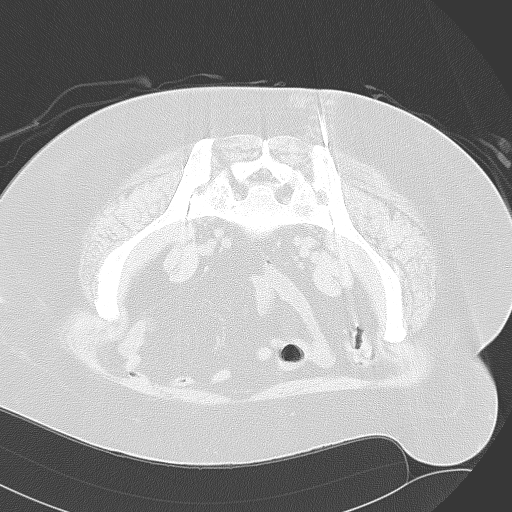
[im 3/3  soft-tissue]
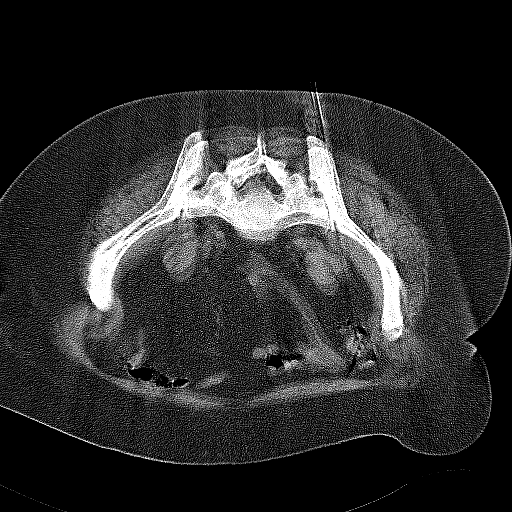
[im 3/3  lung]
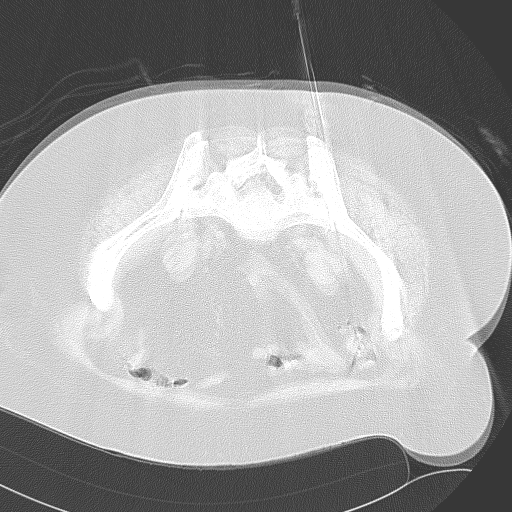

[11 of 27 positions shown; findings below may reference images not displayed]

EXAM:
CT-GUIDED BONE MARROW BIOPSY AND ASPIRATION

MEDICATIONS:
Fentanyl 50 mcg IV; Versed 4 mg IV

ANESTHESIA/SEDATION:
Sedation Time

17 minutes

CONTRAST:  None

COMPLICATIONS:
None immediate.

PROCEDURE:
Informed consent was obtained from the patient following an
explanation of the procedure, risks, benefits and alternatives. The
patient understands, agrees and consents for the procedure. All
questions were addressed. A time out was performed prior to the
initiation of the procedure. The patient was positioned prone and
non-contrast localization CT was performed of the pelvis to
demonstrate the iliac marrow spaces. The operative site was prepped
and draped in the usual sterile fashion.

Under sterile conditions and local anesthesia, a 22 gauge spinal
needle was utilized for procedural planning. Next, an 11 gauge
coaxial bone biopsy needle was advanced into the left iliac marrow
space. Needle position was confirmed with CT imaging. Initially,
bone marrow aspiration was performed. Next, a bone marrow biopsy was
obtained with the 11 gauge outer bone marrow device. Samples were
prepared with the cytotechnologist and deemed adequate. The needle
was removed intact. Hemostasis was obtained with compression and a
dressing was placed. The patient tolerated the procedure well
without immediate post procedural complication.
IMPRESSION: Successful CT guided left iliac bone marrow aspiration and core
biopsies.

## 2017-05-08 IMAGING — US US EXTREM LOW VENOUS*L*
1 series · 13 of 24 positions shown · non-contrast
Comparison: 01/15/2013

CLINICAL DATA: Left leg with swelling in the foot and ankle.



[Series 1: us extrem low venous*left* · 0.08mm/px · 13 of 28 slices shown]
[im 1/28]
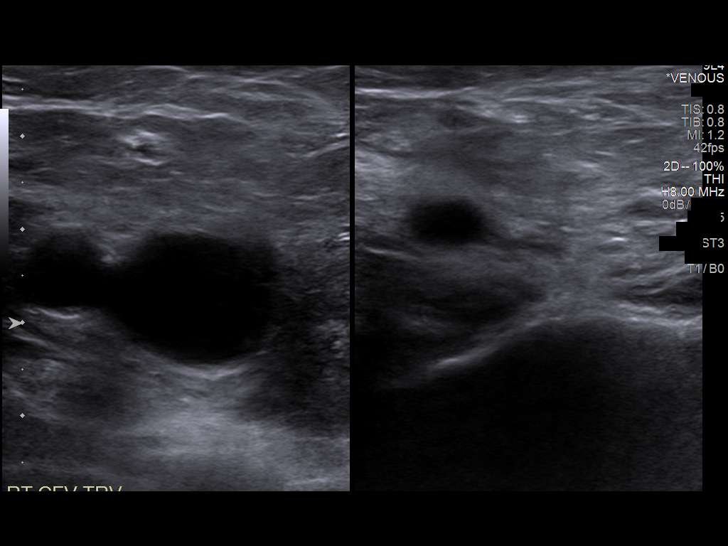
[im 3/28]
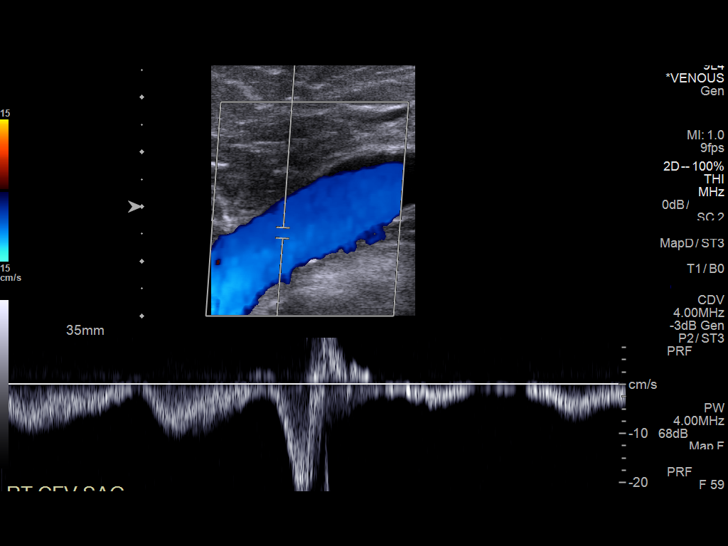
[im 5/28]
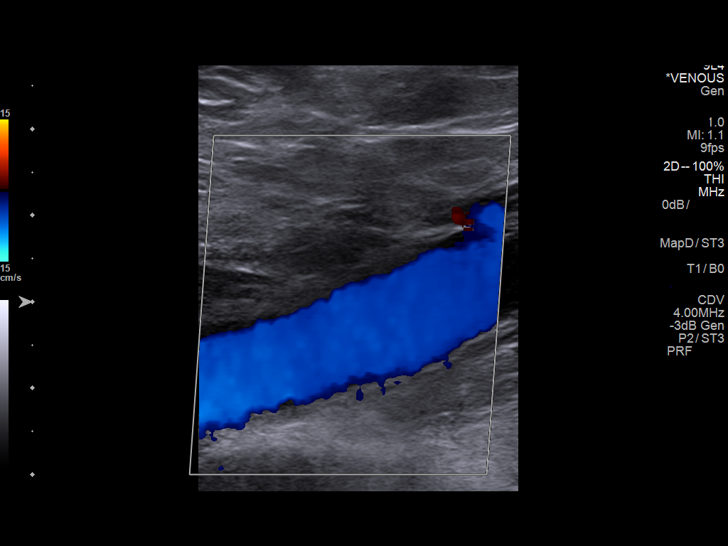
[im 8/28]
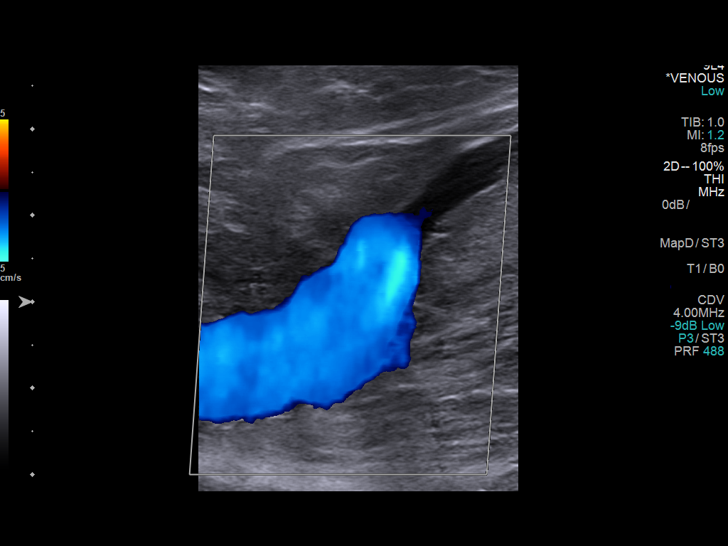
[im 10/28]
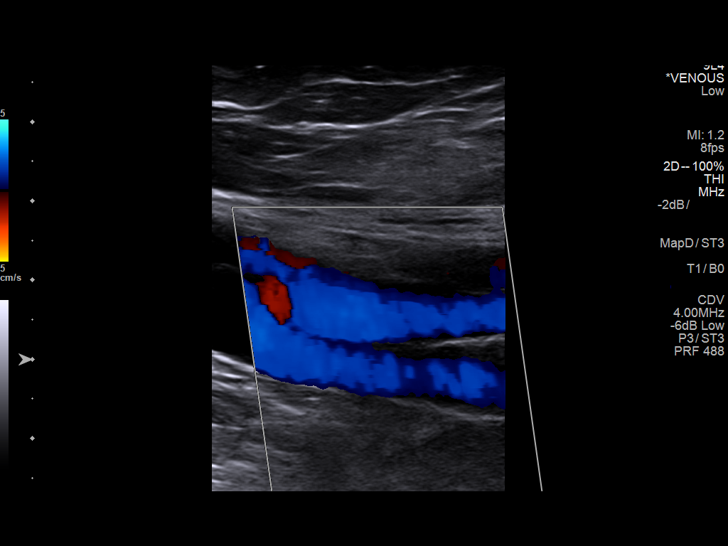
[im 12/28]
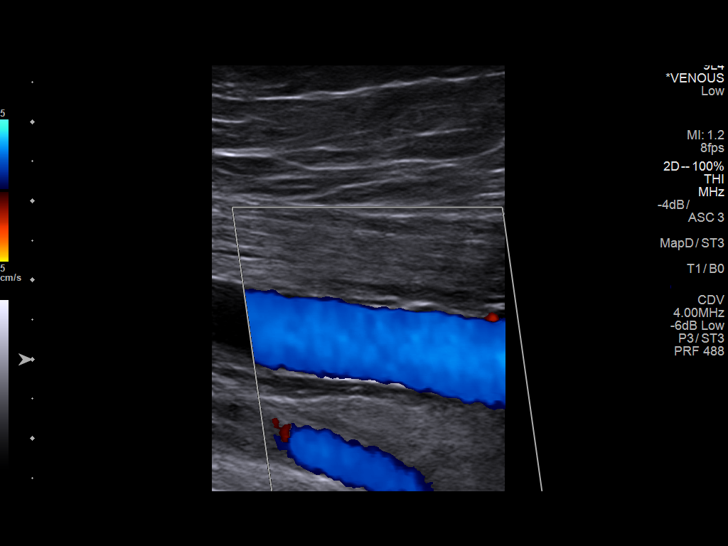
[im 15/28]
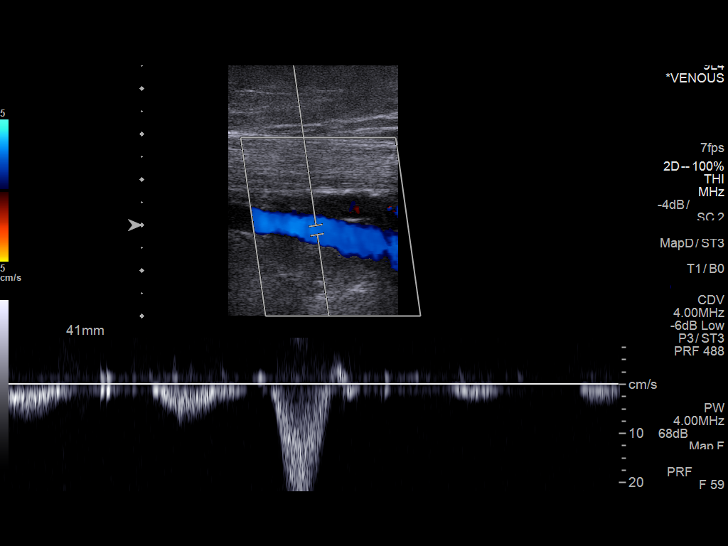
[im 16/28]
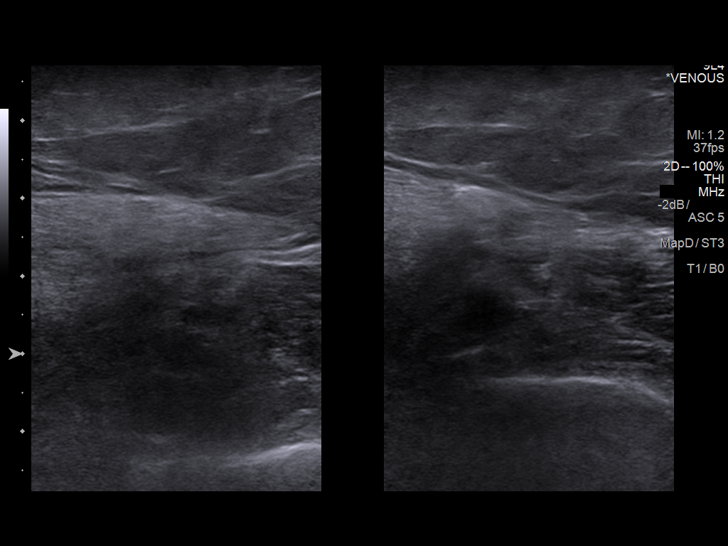
[im 18/28]
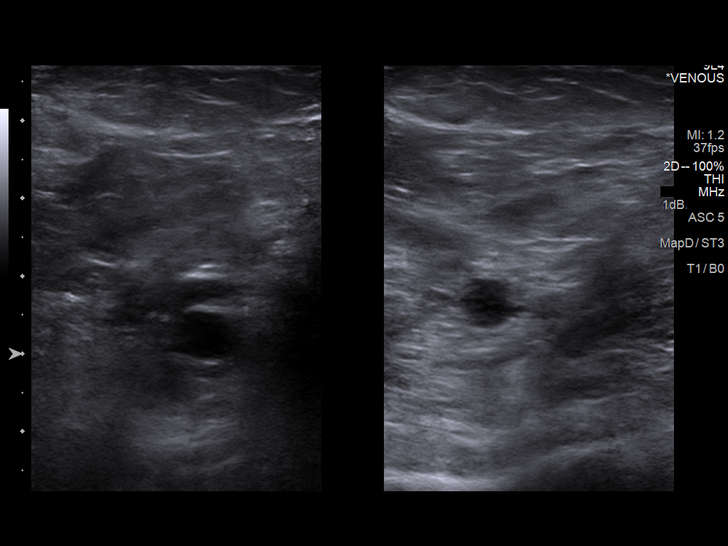
[im 20/28]
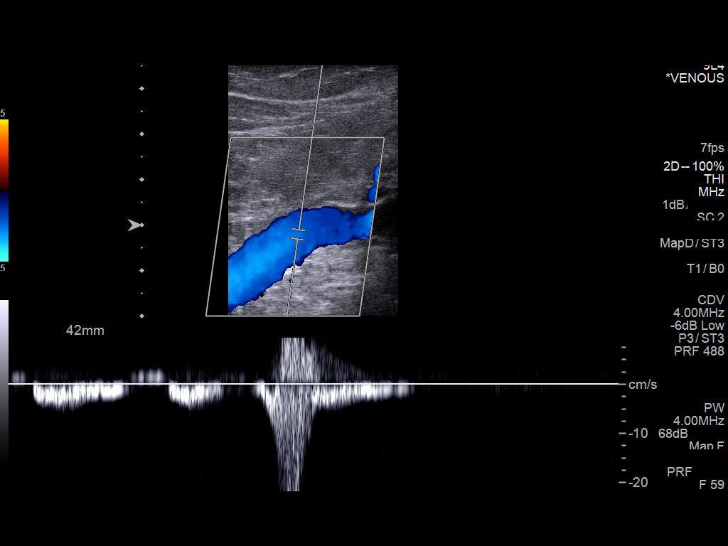
[im 23/28]
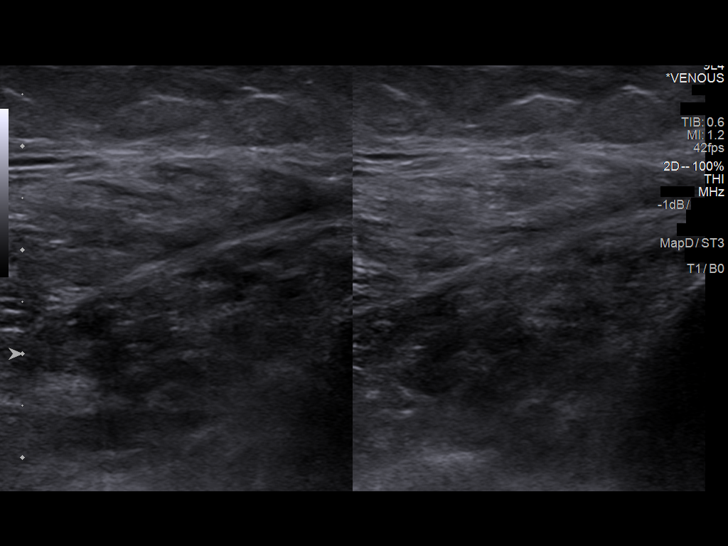
[im 25/28]
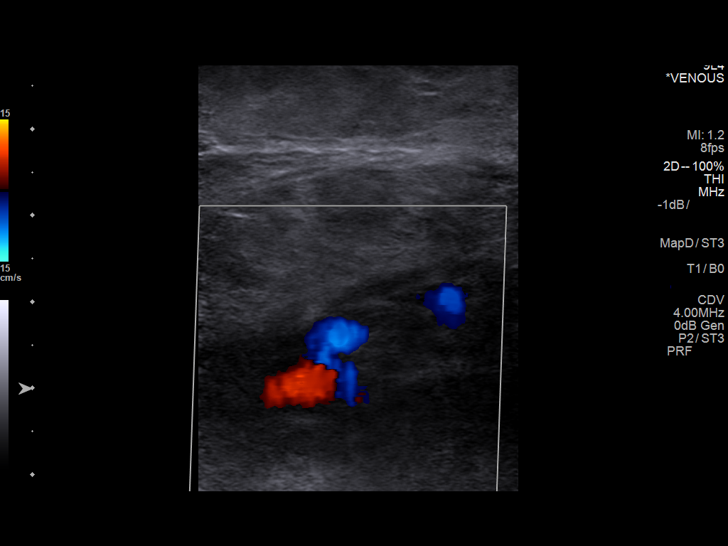
[im 28/28]
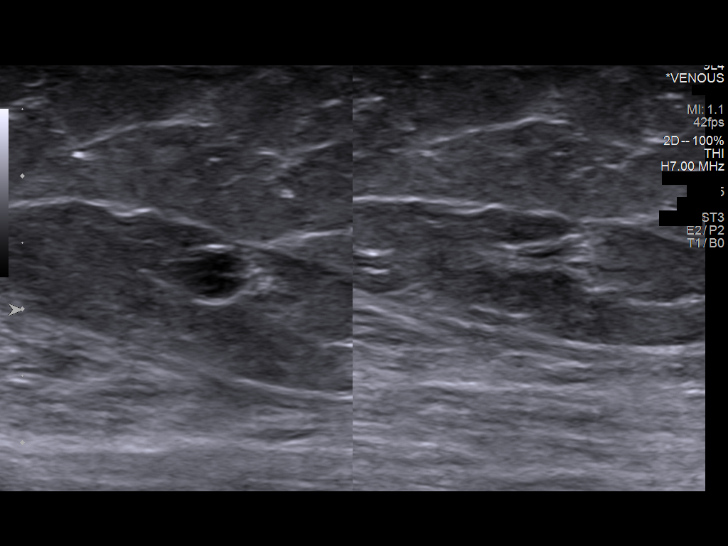

[13 of 24 positions shown; findings below may reference images not displayed]

FINDINGS: Contralateral Common Femoral Vein: Respiratory phasicity is normal
and symmetric with the symptomatic side. No evidence of thrombus.
Normal compressibility.

Common Femoral Vein: No evidence of thrombus.

Saphenofemoral Junction: No evidence of thrombus.

Profunda Femoral Vein: No evidence of thrombus.

Femoral Vein: No evidence of thrombus.

Popliteal Vein: No evidence of thrombus.

Calf Veins: No evidence of thrombus.

Superficial Great Saphenous Vein: No evidence of thrombus.
IMPRESSION: No evidence of left lower extremity deep venous thrombosis.

## 2017-08-24 ENCOUNTER — Other Ambulatory Visit: Payer: Self-pay | Admitting: Nurse Practitioner

## 2017-12-22 IMAGING — DX DG CHEST 2V
3 series · 3 of 3 positions shown · non-contrast
Comparison: Chest radiograph performed 06/23/2011

CLINICAL DATA: Acute onset of left-sided chest pain. Initial
encounter.

EXAM:
CHEST  2 VIEW

[chest pa (1 of 2)]
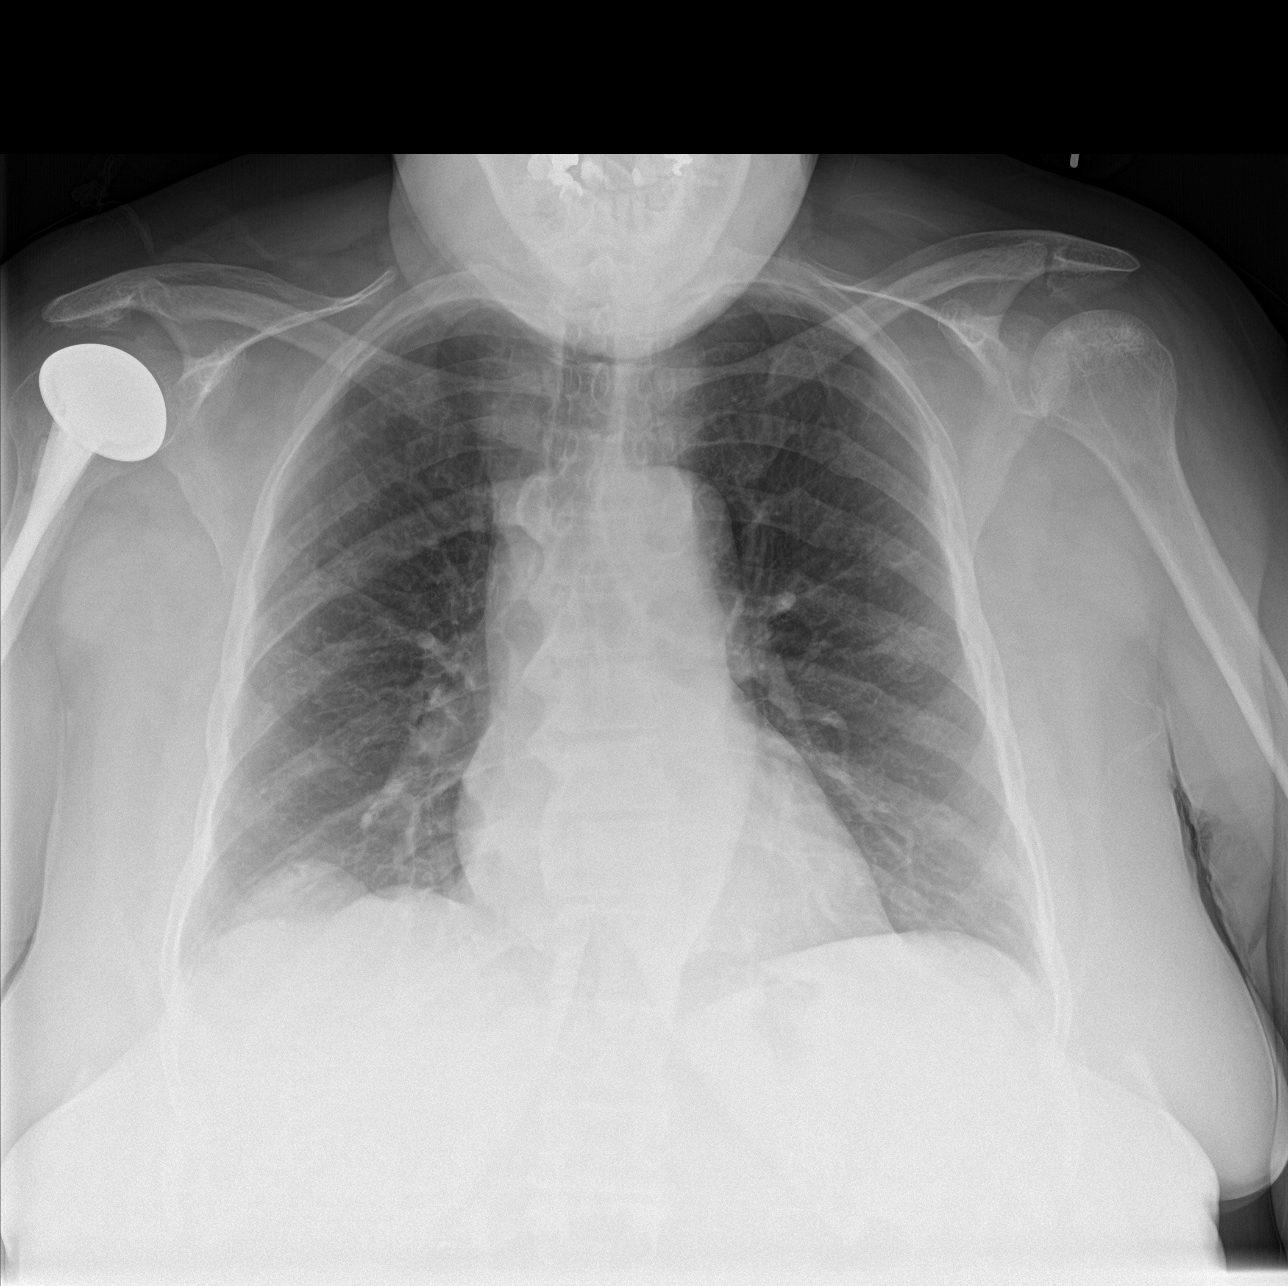

[chest lat]
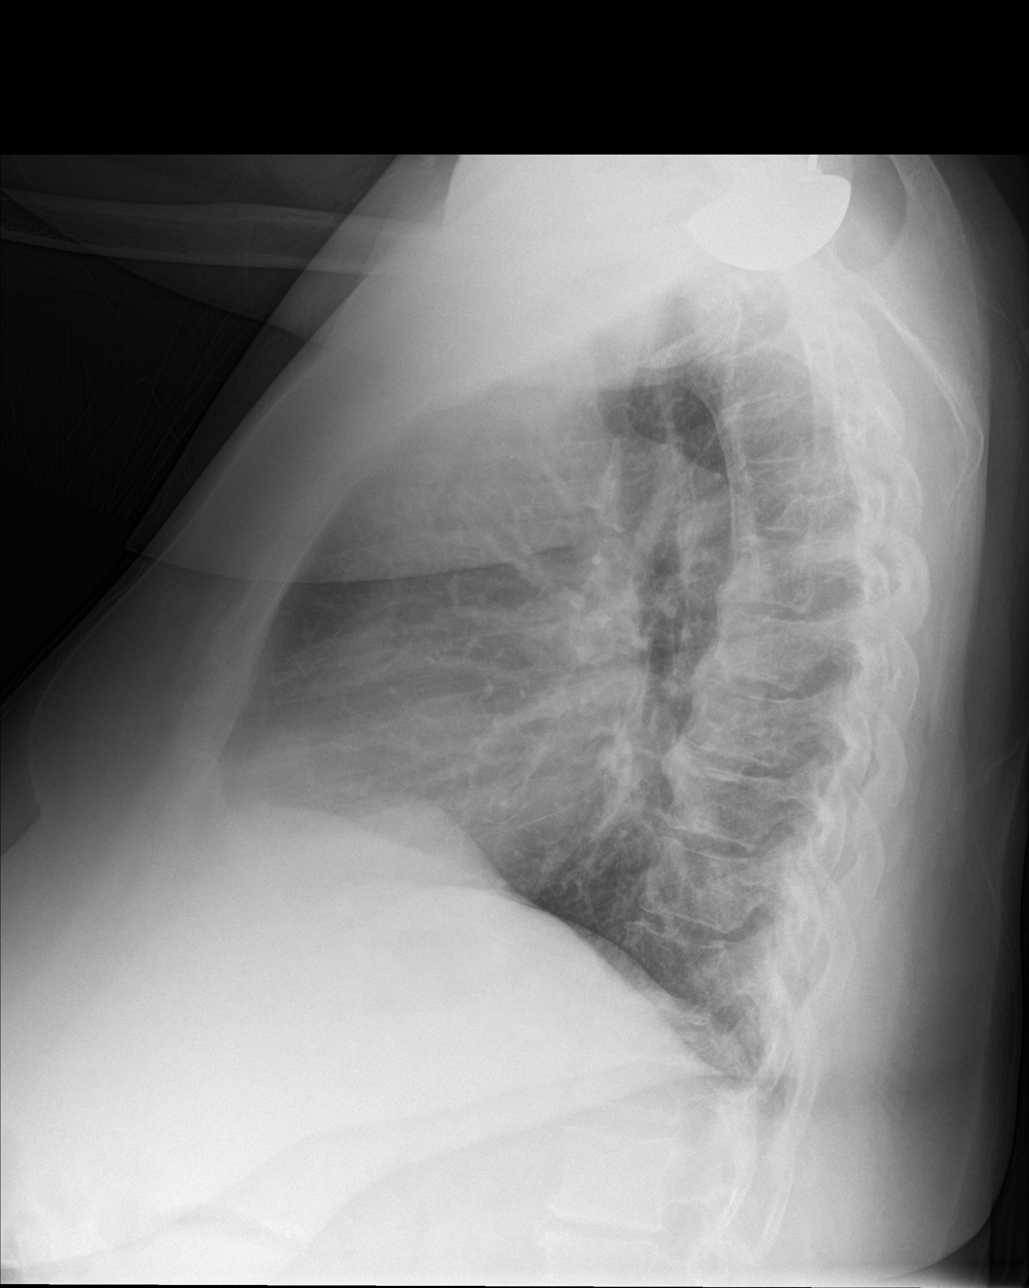

[chest pa (2 of 2)]
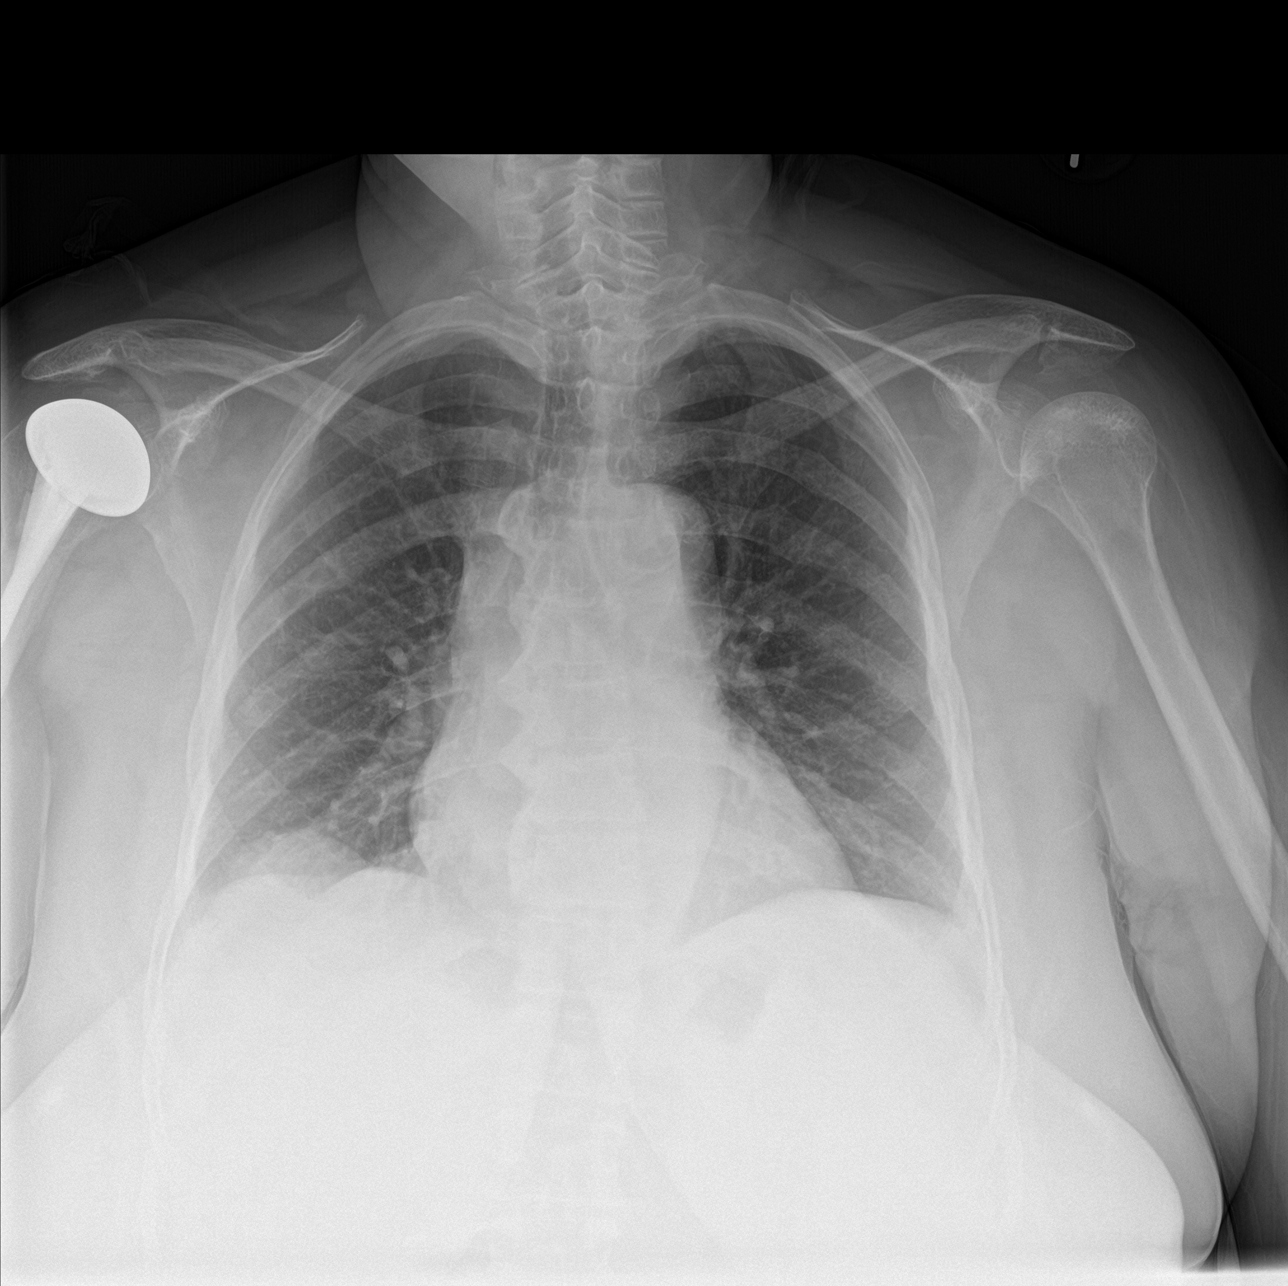

[3 of 3 positions shown; findings below may reference images not displayed]

FINDINGS: The lungs are well-aerated. Minimal bibasilar opacities likely
reflect atelectasis. There is no evidence of pleural effusion or
pneumothorax.

The heart is normal in size; the mediastinal contour is within
normal limits. No acute osseous abnormalities are seen. A
right-sided shoulder arthroplasty is grossly unremarkable in
appearance.
IMPRESSION: Minimal bibasilar opacities likely reflect atelectasis. Lungs
otherwise grossly clear.

## 2019-11-22 ENCOUNTER — Other Ambulatory Visit: Payer: Self-pay | Admitting: Nurse Practitioner
